# Patient Record
Sex: Male | Born: 1935 | Race: White | Hispanic: No | Marital: Married | Smoking: Former smoker
Health system: Southern US, Community
[De-identification: ages and names within clinical notes are randomized; demographics above are authoritative.]

## PROBLEM LIST (undated history)

## (undated) DIAGNOSIS — E785 Hyperlipidemia, unspecified: Secondary | ICD-10-CM

## (undated) DIAGNOSIS — I2510 Atherosclerotic heart disease of native coronary artery without angina pectoris: Secondary | ICD-10-CM

## (undated) DIAGNOSIS — D649 Anemia, unspecified: Secondary | ICD-10-CM

## (undated) DIAGNOSIS — Z8701 Personal history of pneumonia (recurrent): Secondary | ICD-10-CM

## (undated) DIAGNOSIS — G459 Transient cerebral ischemic attack, unspecified: Secondary | ICD-10-CM

## (undated) DIAGNOSIS — N400 Enlarged prostate without lower urinary tract symptoms: Secondary | ICD-10-CM

## (undated) DIAGNOSIS — E119 Type 2 diabetes mellitus without complications: Secondary | ICD-10-CM

## (undated) DIAGNOSIS — I10 Essential (primary) hypertension: Secondary | ICD-10-CM

## (undated) HISTORY — DX: Personal history of pneumonia (recurrent): Z87.01

## (undated) HISTORY — DX: Atherosclerotic heart disease of native coronary artery without angina pectoris: I25.10

## (undated) HISTORY — DX: Type 2 diabetes mellitus without complications: E11.9

## (undated) HISTORY — PX: HERNIA REPAIR: SHX51

## (undated) HISTORY — DX: Transient cerebral ischemic attack, unspecified: G45.9

## (undated) HISTORY — DX: Benign prostatic hyperplasia without lower urinary tract symptoms: N40.0

## (undated) HISTORY — DX: Hyperlipidemia, unspecified: E78.5

## (undated) HISTORY — PX: TONSILLECTOMY: SUR1361

## (undated) HISTORY — DX: Essential (primary) hypertension: I10

## (undated) HISTORY — DX: Anemia, unspecified: D64.9

## (undated) HISTORY — PX: CATARACT EXTRACTION: SUR2

---

## 1990-05-25 DIAGNOSIS — I2510 Atherosclerotic heart disease of native coronary artery without angina pectoris: Secondary | ICD-10-CM

## 1990-05-25 HISTORY — PX: CORONARY ARTERY BYPASS GRAFT: SHX141

## 1990-05-25 HISTORY — DX: Atherosclerotic heart disease of native coronary artery without angina pectoris: I25.10

## 2007-05-25 ENCOUNTER — Ambulatory Visit (HOSPITAL_COMMUNITY): Admission: RE | Admit: 2007-05-25 | Discharge: 2007-05-25 | Payer: Self-pay | Admitting: Internal Medicine

## 2007-06-02 ENCOUNTER — Ambulatory Visit (HOSPITAL_COMMUNITY): Admission: RE | Admit: 2007-06-02 | Discharge: 2007-06-02 | Payer: Self-pay | Admitting: Internal Medicine

## 2007-06-22 ENCOUNTER — Ambulatory Visit: Payer: Self-pay | Admitting: Cardiology

## 2007-09-15 ENCOUNTER — Ambulatory Visit (HOSPITAL_COMMUNITY): Admission: RE | Admit: 2007-09-15 | Discharge: 2007-09-15 | Payer: Self-pay | Admitting: Internal Medicine

## 2008-06-27 ENCOUNTER — Ambulatory Visit: Payer: Self-pay | Admitting: Cardiology

## 2009-01-09 ENCOUNTER — Encounter (INDEPENDENT_AMBULATORY_CARE_PROVIDER_SITE_OTHER): Payer: Self-pay | Admitting: *Deleted

## 2009-01-09 LAB — CONVERTED CEMR LAB
ALT: 11 units/L
AST: 12 units/L
CO2: 23 meq/L
Chloride: 105 meq/L
Cholesterol: 136 mg/dL
Creatinine, Ser: 1.39 mg/dL
Glucose, Bld: 115 mg/dL
HDL: 46 mg/dL
Hgb A1c MFr Bld: 5.5 %
Total Protein: 6.8 g/dL

## 2009-07-22 ENCOUNTER — Encounter (INDEPENDENT_AMBULATORY_CARE_PROVIDER_SITE_OTHER): Payer: Self-pay | Admitting: *Deleted

## 2009-07-23 LAB — CONVERTED CEMR LAB
ALT: 12 units/L
Alkaline Phosphatase: 34 units/L
Calcium: 9.4 mg/dL
Creatinine, Ser: 1.35 mg/dL
Glomerular Filtration Rate, Af Am: 51 mL/min/{1.73_m2}
Glucose, Bld: 85 mg/dL
LDL Cholesterol: 64 mg/dL
Sodium: 138 meq/L

## 2009-07-26 ENCOUNTER — Encounter (INDEPENDENT_AMBULATORY_CARE_PROVIDER_SITE_OTHER): Payer: Self-pay | Admitting: *Deleted

## 2009-07-26 DIAGNOSIS — J189 Pneumonia, unspecified organism: Secondary | ICD-10-CM

## 2009-07-30 ENCOUNTER — Ambulatory Visit: Payer: Self-pay | Admitting: Cardiology

## 2009-12-16 ENCOUNTER — Encounter (INDEPENDENT_AMBULATORY_CARE_PROVIDER_SITE_OTHER): Payer: Self-pay | Admitting: *Deleted

## 2009-12-16 LAB — CONVERTED CEMR LAB
ALT: 10 units/L
Alkaline Phosphatase: 36 units/L
CO2: 26 meq/L
Calcium: 9.5 mg/dL
Creatinine, Ser: 1.29 mg/dL
Glomerular Filtration Rate, Af Am: >60 mL/min/{1.73_m2}
Glucose, Bld: 111 mg/dL
HDL: 44 mg/dL
Hgb A1c MFr Bld: 6.2 %
LDL Cholesterol: 72 mg/dL
Sodium: 140 meq/L
Total Protein: 7.1 g/dL

## 2010-01-21 ENCOUNTER — Encounter: Payer: Self-pay | Admitting: Adult Health

## 2010-01-21 ENCOUNTER — Ambulatory Visit: Payer: Self-pay | Admitting: Cardiology

## 2010-06-24 NOTE — Miscellaneous (Signed)
Summary: LABS CMP,LIPIDS,01/09/2009  Clinical Lists Changes  Observations: Added new observation of CALCIUM: 9.2 mg/dL (03/47/4259 56:38) Added new observation of ALBUMIN: 4.5 g/dL (75/64/3329 51:88) Added new observation of PROTEIN, TOT: 6.8 g/dL (41/66/0630 16:01) Added new observation of SGPT (ALT): 11 units/L (01/09/2009 14:51) Added new observation of SGOT (AST): 12 units/L (01/09/2009 14:51) Added new observation of ALK PHOS: 40 units/L (01/09/2009 14:51) Added new observation of CREATININE: 1.39 mg/dL (09/32/3557 32:20) Added new observation of BUN: 24 mg/dL (25/42/7062 37:62) Added new observation of BG RANDOM: 115 mg/dL (83/15/1761 60:73) Added new observation of CO2 PLSM/SER: 23 meq/L (01/09/2009 14:51) Added new observation of CL SERUM: 105 meq/L (01/09/2009 14:51) Added new observation of K SERUM: 4.9 meq/L (01/09/2009 14:51) Added new observation of NA: 141 meq/L (01/09/2009 14:51) Added new observation of LDL: 66 mg/dL (71/10/2692 85:46) Added new observation of HDL: 46 mg/dL (27/07/5007 38:18) Added new observation of TRIGLYC TOT: 118 mg/dL (29/93/7169 67:89) Added new observation of CHOLESTEROL: 136 mg/dL (38/02/1750 02:58) Added new observation of HGBA1C: 5.5 % (01/09/2009 14:51)

## 2010-06-24 NOTE — Assessment & Plan Note (Signed)
Summary: 1 YR /FU PER CHECKOUT ON 06/27/08/TG  Medications Added ATIVAN 1 MG TABS (LORAZEPAM) take 2 tabs at bedtime METFORMIN HCL 500 MG TABS (METFORMIN HCL) take 1 tab two times a day SIMVASTATIN 40 MG TABS (SIMVASTATIN) take 1 tab daily      Allergies Added: NKDA  Visit Type:  Follow-up Primary Provider:  Sabino Snipes   History of Present Illness: [name] [name] returns as scheduled for continued assessment and treatment of coronary disease and cardiovascular risk factors, now 19 years following CABG surgery.  Over the past 12 months, he has continued to do extremely well.  He notes no dyspnea, chest discomfort nor other cardiopulmonary symptoms.  Blood pressure control has been good.  Recent laboratory studies were obtained by Dr. Margo Aye, but the patient is unaware of the results.  We have requested those.  MRI Brain  Procedure date:  02/17/2006  Findings:      Sinus disease Fairly mild supratentorial small vessel disease No acute infarction  CT Brain  Procedure date:  02/17/2006  Findings:      Sinus disease Prior small lacunar infarct in the left globus palladium Mild generalized atrophy  Carotid Doppler  Procedure date:  02/17/2006  Findings:      No significant focal increase in velocity   Current Medications (verified): 1)  Plavix 75 Mg Tabs (Clopidogrel Bisulfate) .... Take 1 Tablet By Mouth Once Daily 2)  Quinapril Hcl 40 Mg Tabs (Quinapril Hcl) .... Take 1 Tablet By Mouth Once A Day 3)  Ativan 1 Mg Tabs (Lorazepam) .... Take 2 Tabs At Bedtime 4)  Avodart 0.5 Mg Caps (Dutasteride) .... Take 1 Tab Daily 5)  Flomax 0.4 Mg Caps (Tamsulosin Hcl) .... Take 1 Tab Daily 6)  Aspir-Low 81 Mg Tbec (Aspirin) .... Take 1 Tab Daily 7)  Metformin Hcl 500 Mg Tabs (Metformin Hcl) .... Take 1 Tab Two Times A Day 8)  Chlorthalidone 25 Mg Tabs (Chlorthalidone) .... Take 1/2 Tab Daily 9)  Ra Fish Oil 1000 Mg Caps (Omega-3 Fatty Acids) .... Take 1 Cap Daily 10)  Symbicort  160-4.5 Mcg/act Aero (Budesonide-Formoterol Fumarate) .... Use As Needed 11)  Simvastatin 40 Mg Tabs (Simvastatin) .... Take 1 Tab Daily  Allergies (verified): No Known Drug Allergies  Past History:  PMH, FH, and Social History reviewed and updated.  Past Medical History: ASCVD-CABG surgery-1992 HYPERTENSION (ICD-401.9) HYPERLIPIDEMIA (ICD-272.4) TRANSIENT ISCHEMIC ATTACK (ICD-435.9) Tobacco abuse-40 pack years discontinued in 1996 PNEUMONIA (ICD-486) DIABETES MELLITUS, TYPE II (ICD-250.00) BENIGN PROSTATIC HYPERTROPHY, HX OF (ICD-V13.8)  Past Surgical History: Coronary artery bypass graft surgery-1992 Tonsillectomy Herniorrhaphy Bilaterall cataract  extraction  Review of Systems  The patient denies anorexia, weight loss, weight gain, vision loss, decreased hearing, hoarseness, chest pain, syncope, dyspnea on exertion, peripheral edema, prolonged cough, headaches, hemoptysis, abdominal pain, and melena.    Vital Signs:  Patient profile:   77 year old male Height:      69 inches Weight:      142 pounds BMI:     21.05 Pulse rate:   83 / minute BP sitting:   124 / 68  (right arm)  Vitals Entered By: Larita Fife Via LPN (July 30, 452 2:55 PM)  Physical Exam  General:  Thin; well developed; no acute distress:   Neck-No JVD; no carotid bruits: Lungs-No tachypnea, no rales; no rhonchi; no wheezes: Cardiovascular-normal PMI; normal S1 and S2; minimal systolic murmur Abdomen-BS normal; soft and non-tender without masses or organomegaly:  Musculoskeletal-No deformities, no cyanosis or clubbing:  Neurologic-Normal cranial nerves; symmetric strength and tone; oriented x3; memory slightly depressed at 5-6 and digits; no dysarthria; no dysnomia Skin-Warm, no significant lesions: Extremities-Nl distal pulses; no edema:     Impression & Recommendations:  Problem # 1:  ATHEROSCLEROTIC CARDIOVASCULAR DISEASE (ICD-429.2) No current symptoms to suggest recurrent myocardial ischemia.   Management will continue to focus on optimal control of risk factors.    We still have not received records requested from his prior cardiologist.  That request will be renewed.  I will review these records when available.  Problem # 2:  HYPERLIPIDEMIA (ICD-272.4) Lipid profile was excellent in August of 2010, and has apparently been obtained within the past week or two.  A request for review of those results is pending.  Problem # 3:  HYPERTENSION (ICD-401.9) Blood pressure control is excellent; current medications will be continued.  Problem # 4:  TRANSIENT ISCHEMIC ATTACK (ICD-435.9) [facility] from [facility] reviewed.  No acute central nervous system damage was identified.  A CT scan suggested the presence of an old lacunar infarction, but this was not verified by magnetic resonance imaging.  Initial symptoms of confusion, speech disturbance and unilateral weakness resolved completely.  Plavix will be continued.  [name] [name] will return to the office for reassessment in one year.  Patient Instructions: 1)  Your physician recommends that you schedule a follow-up appointment in: 1 year

## 2010-06-24 NOTE — Miscellaneous (Signed)
Summary: quinapril update  Clinical Lists Changes  Medications: Added new medication of QUINAPRIL HCL 40 MG TABS (QUINAPRIL HCL) Take 1 tablet by mouth once a day

## 2010-06-24 NOTE — Assessment & Plan Note (Signed)
Summary: per pt's daughter/having problems/tg  Medications Added NITROLINGUAL 0.4 MG/SPRAY SOLN (NITROGLYCERIN) One spray under tongue every 5 minutes as needed for chest pain---may repeat times three      Allergies Added: NKDA  Visit Type:  Follow-up Primary Provider:  Sabino Snipes  CC:  sob and numbness in arms and legs.  History of Present Illness: [name] [name] is a very pleasant 77 CM with known history of CAD with CABG 10 years ago.  I do not have records available for anatomy of bypass grafts.  He also has a history of HTN, DM, Hypercholesterolemia.   He has had no recent CV testing as he has been asymtomatic and works a lot in his yard without compaint of weakness, SOB or fatigue.  He is focused on his wife who is debilitated from arthritis and other health issues.  He is not getting very much sleep and feels stressed.  His son -n-law is with him who says the patient's daughter helps him as much as she can.    Preventive Screening-Counseling & Management  Alcohol-Tobacco     Alcohol drinks/day: 0     Smoking Status: quit  Current Medications (verified): 1)  Plavix 75 Mg Tabs (Clopidogrel Bisulfate) .... Take 1 Tablet By Mouth Once Daily 2)  Quinapril Hcl 40 Mg Tabs (Quinapril Hcl) .... Take 1 Tablet By Mouth Once A Day 3)  Ativan 1 Mg Tabs (Lorazepam) .... Take 2 Tabs At Bedtime 4)  Avodart 0.5 Mg Caps (Dutasteride) .... Take 1 Tab Daily 5)  Flomax 0.4 Mg Caps (Tamsulosin Hcl) .... Take 1 Tab Daily 6)  Aspir-Low 81 Mg Tbec (Aspirin) .... Take 1 Tab Daily 7)  Metformin Hcl 500 Mg Tabs (Metformin Hcl) .... Take 1 Tab Two Times A Day 8)  Chlorthalidone 25 Mg Tabs (Chlorthalidone) .... Take 1/2 Tab Daily 9)  Ra Fish Oil 1000 Mg Caps (Omega-3 Fatty Acids) .... Take 1 Cap Daily 10)  Symbicort 160-4.5 Mcg/act Aero (Budesonide-Formoterol Fumarate) .... Use As Needed 11)  Simvastatin 40 Mg Tabs (Simvastatin) .... Take 1 Tab Daily 12)  Nitrolingual 0.4 Mg/spray Soln (Nitroglycerin) ....  One Spray Under Tongue Every 5 Minutes As Needed For Chest Pain---May Repeat Times Three  Allergies (verified): No Known Drug Allergies  Past History:  Past medical, surgical, family and social histories (including risk factors) reviewed, and no changes noted (except as noted below).  Past Medical History: Reviewed history from 07/30/2009 and no changes required. ASCVD-CABG surgery-1992 HYPERTENSION (ICD-401.9) HYPERLIPIDEMIA (ICD-272.4) TRANSIENT ISCHEMIC ATTACK (ICD-435.9) Tobacco abuse-40 pack years discontinued in 1996 PNEUMONIA (ICD-486) DIABETES MELLITUS, TYPE II (ICD-250.00) BENIGN PROSTATIC HYPERTROPHY, HX OF (ICD-V13.8)  Past Surgical History: Reviewed history from 07/30/2009 and no changes required. Coronary artery bypass graft surgery-1992 Tonsillectomy Herniorrhaphy Bilaterall cataract  extraction  Family History: Reviewed history from 07/26/2009 and no changes required. Father:deceased age 28 cancer type unknown Mother:deceased 56 cancer type unknown Siblings:7 brothers all deceased causes unknow 5 sisters all deceased causes unknown  Social History: Reviewed history from 07/26/2009 and no changes required. Retired  Married  Tobacco Use - Former.  Alcohol Use - no Regular Exercise - yes Drug Use - no Alcohol drinks/day:  0  Review of Systems       Stress and insomina secondary to wife's illness.  All other systems have been reviewed and are negative unless stated above.   Vital Signs:  Patient profile:   77 year old male Weight:      136 pounds BMI:  20.16 O2 Sat:      94 % on Room air Pulse rate:   93 / minute BP sitting:   144 / 72  (right arm)  Vitals Entered By: Dreama Saa, CNA (January 21, 2010 11:21 AM)  O2 Flow:  Room air  Physical Exam  General:  normal appearance.   Head:  normocephalic and atraumatic Mouth:  Teeth, gums and palate normal. Oral mucosa normal. Lungs:  Clear bilaterally to auscultation and percussion. Heart:   Distant with RRR.No MRG. Abdomen:  Bowel sounds positive; abdomen soft and non-tender without masses, organomegaly, or hernias noted. No hepatosplenomegaly. Msk:  Kyphosis noted. Pulses:  pulses normal in all 4 extremities Extremities:  No clubbing or cyanosis. Neurologic:  Hard of hearing. Psych:  Normal affect.   EKG  Procedure date:  01/21/2010  Findings:      Normal sinus rhythm with rate of:  86bpm  Impression & Recommendations:  Problem # 1:  ATHEROSCLEROTIC CARDIOVASCULAR DISEASE (ICD-429.2) [name] [name] is asymptomatic from cardiovascular standpoint.  He remains active.  He is under a lot of stress concerning his wife's care and is not sleeping well.  I have advised him that increased stress and lack of sleep are not helpful, most importantly in the setting of heart disease.  I have provided him with a Rx for NTG spray. I have also advised him to talk with Dr. Margo Aye if help in the home is needed to allow him to have less stress and the ability to sleep. From a safety standpoint I have also advised that lack of sleep can affect attention span and he is to be especially careful when driving, as his reactions could be slower than normal.  He verbalized understanding.    Problem # 2:  HYPERTENSION (ICD-401.9) Mildly increased on this visit.  I have no trend of higher BP, however.  His last BP was 124/68, which is optimal for a person with DM and heart disease.  If this remain elevated on follow up visits with primary care, would advise that he have additional antihypertensive added-norvasc or low dose BB as a third choice with lung disease. He can also have his chlorothialidone increased from 12.5 mg to 25mg  daily as a first change in BP medications. His updated medication list for this problem includes:    Quinapril Hcl 40 Mg Tabs (Quinapril hcl) .Marland Kitchen... Take 1 tablet by mouth once a day    Aspir-low 81 Mg Tbec (Aspirin) .Marland Kitchen... Take 1 tab daily    Chlorthalidone 25 Mg Tabs (Chlorthalidone)  .Marland Kitchen... Take 1/2 tab daily  Patient Instructions: 1)  Your physician recommends that you schedule a follow-up appointment in: 6 months 2)  Your physician has recommended you make the following change in your medication: Start using Nitroglycerin spray as needed for chest pain 3)  Your physician recommended you take 1 tablet (or 1 spray) under tongue at onset of chest pain; you may repeat every 5 minutes for up to 3 doses. If 3 or more doses are required, call 911 and proceed to the ER immediately. Prescriptions: CHLORTHALIDONE 25 MG TABS (CHLORTHALIDONE) take 1/2 tab daily  #15 x 10   Entered by:   Larita Fife Via LPN   Authorized by:   Joni Reining, NP   Signed by:   Larita Fife Via LPN on 51/70/0174   Method used:   Electronically to        [facility] (retail)       [address]  [place], [place]  [address]       Ph: [phone]       Fax: [phone]   RxID:   [id number] NITROLINGUAL 0.4 MG/SPRAY SOLN (NITROGLYCERIN) One spray under tongue every 5 minutes as needed for chest pain---may repeat times three  #1 x 3   Entered by:   Larita Fife Via LPN   Authorized by:   Joni Reining, NP   Signed by:   Larita Fife Via LPN on 09/32/3557   Method used:   Electronically to        [facility] (retail)       [address]       [place], [place]  [address]       Ph: [phone]       Fax: [phone]   RxID:   [id number]

## 2010-07-15 ENCOUNTER — Encounter (INDEPENDENT_AMBULATORY_CARE_PROVIDER_SITE_OTHER): Payer: Self-pay | Admitting: *Deleted

## 2010-07-17 ENCOUNTER — Ambulatory Visit (INDEPENDENT_AMBULATORY_CARE_PROVIDER_SITE_OTHER): Payer: Medicare Other | Admitting: Cardiology

## 2010-07-17 ENCOUNTER — Encounter: Payer: Self-pay | Admitting: Cardiology

## 2010-07-17 DIAGNOSIS — I2510 Atherosclerotic heart disease of native coronary artery without angina pectoris: Secondary | ICD-10-CM

## 2010-07-18 ENCOUNTER — Other Ambulatory Visit: Payer: Self-pay | Admitting: Cardiology

## 2010-07-18 ENCOUNTER — Ambulatory Visit (HOSPITAL_COMMUNITY)
Admission: RE | Admit: 2010-07-18 | Discharge: 2010-07-18 | Disposition: A | Discharge status: Home / Self Care | Payer: Medicare Other | Source: Ambulatory Visit | Attending: Cardiology | Admitting: Cardiology

## 2010-07-18 DIAGNOSIS — R0989 Other specified symptoms and signs involving the circulatory and respiratory systems: Secondary | ICD-10-CM

## 2010-07-18 DIAGNOSIS — I517 Cardiomegaly: Secondary | ICD-10-CM | POA: Insufficient documentation

## 2010-07-22 NOTE — Miscellaneous (Signed)
Summary: LABS CMP,LIPIDS 12/16/2009  Clinical Lists Changes  Observations: Added new observation of CALCIUM: 9.5 mg/dL (16/02/9603 54:09) Added new observation of ALBUMIN: 4.6 g/dL (81/19/1478 29:56) Added new observation of PROTEIN, TOT: 7.1 g/dL (21/30/8657 84:69) Added new observation of SGPT (ALT): 10 units/L (12/16/2009 15:00) Added new observation of SGOT (AST): 15 units/L (12/16/2009 15:00) Added new observation of ALK PHOS: 36 units/L (12/16/2009 15:00) Added new observation of GFR AA: >60 mL/min/1.66m2 (12/16/2009 15:00) Added new observation of GFR: 54 mL/min (12/16/2009 15:00) Added new observation of CREATININE: 1.29 mg/dL (62/95/2841 32:44) Added new observation of BUN: 27 mg/dL (05/27/7251 66:44) Added new observation of BG RANDOM: 111 mg/dL (03/47/4259 56:38) Added new observation of CO2 PLSM/SER: 26 meq/L (12/16/2009 15:00) Added new observation of CL SERUM: 104 meq/L (12/16/2009 15:00) Added new observation of K SERUM: 4.7 meq/L (12/16/2009 15:00) Added new observation of NA: 140 meq/L (12/16/2009 15:00) Added new observation of LDL: 72 mg/dL (75/64/3329 51:88) Added new observation of HDL: 44 mg/dL (41/66/0630 16:01) Added new observation of TRIGLYC TOT: 133 mg/dL (09/32/3557 32:20) Added new observation of CHOLESTEROL: 143 mg/dL (25/42/7062 37:62) Added new observation of HGBA1C: 6.2 % (12/16/2009 15:00)

## 2010-07-31 NOTE — Assessment & Plan Note (Signed)
Summary: due for 6 mth f/u per pt phone call/tg  Medications Added ATIVAN 0.5 MG TABS (LORAZEPAM) 2 tablets at bedtime for sleep PROAIR HFA 108 (90 BASE) MCG/ACT AERS (ALBUTEROL SULFATE) 2 puffs prn      Allergies Added: NKDA  Visit Type:  6 month follow up Primary Provider:  Sabino Snipes   History of Present Illness: [name] returns to the office as scheduled for continued assessment and treatment of coronary disease and cardiovascular risk factors.  Since he was last seen 6 months ago, he has done generally well.  He denies orthopnea, PND, chest discomfort, dyspnea, lightheadedness or syncope.   Current Medications (verified): 1)  Plavix 75 Mg Tabs (Clopidogrel Bisulfate) .... Take 1 Tablet By Mouth Once Daily 2)  Quinapril Hcl 40 Mg Tabs (Quinapril Hcl) .... Take 1 Tablet By Mouth Once A Day 3)  Ativan 0.5 Mg Tabs (Lorazepam) .... 2 Tablets At Bedtime For Sleep 4)  Avodart 0.5 Mg Caps (Dutasteride) .... Take 1 Tab Daily 5)  Flomax 0.4 Mg Caps (Tamsulosin Hcl) .... Take 1 Tab Daily 6)  Aspir-Low 81 Mg Tbec (Aspirin) .... Take 1 Tab Daily 7)  Metformin Hcl 500 Mg Tabs (Metformin Hcl) .... Take 1 Tab Two Times A Day 8)  Chlorthalidone 25 Mg Tabs (Chlorthalidone) .... Take 1/2 Tab Daily 9)  Ra Fish Oil 1000 Mg Caps (Omega-3 Fatty Acids) .... Take 1 Cap Daily 10)  Proair Hfa 108 (90 Base) Mcg/act Aers (Albuterol Sulfate) .... 2 Puffs Prn 11)  Simvastatin 40 Mg Tabs (Simvastatin) .... Take 1 Tab Daily 12)  Nitrolingual 0.4 Mg/spray Soln (Nitroglycerin) .... One Spray Under Tongue Every 5 Minutes As Needed For Chest Pain---May Repeat Times Three  Allergies (verified): No Known Drug Allergies  Past History:  Past Medical History: ASCVD-CABG surgery-1992 Hypertension Hyperlipidemia TIA Tobacco abuse-40 pack years discontinued in 1996 Pneumonia Diabetes Benign prostatic hypertrophy  Review of Systems       See history of present illness.  Vital Signs:  Patient  profile:   77 year old male Height:      69 inches Weight:      139 pounds O2 Sat:      96 % on Room air Pulse rate:   81 / minute BP sitting:   128 / 74  (left arm)  Vitals Entered By: Teressa Lower RN (July 17, 2010 1:00 PM)  O2 Flow:  Room air  Physical Exam  General:  Proportionate weight and height; well developed; no acute distress:   Neck-No JVD; no carotid bruits: Lungs-Mild kyphosis; modest rales at the right base; no rhonchi; no wheezes; prolonged expiratory phase Cardiovascular-normal PMI; normal S1 and S2; S4 present Abdomen-BS normal; soft and non-tender without masses or organomegaly:  Musculoskeletal-No deformities, no cyanosis or clubbing: Neurologic-Normal cranial nerves; symmetric strength and tone:  Skin-Warm, no significant lesions: Extremities-Nl distal pulses; no edema:     Impression & Recommendations:  Problem # 1:  ATHEROSCLEROTIC CARDIOVASCULAR DISEASE (ICD-429.2) No evidence for ischemic symptoms; current approach shows optimally managing risk factors will be continued.  Problem # 2:  HYPERLIPIDEMIA (ICD-272.4) Lipid profile was excellent 7 months ago on current therapy, which will be continued.  CHOL: 143 (12/16/2009)   LDL: 72 (12/16/2009)   HDL: 44 (12/16/2009)   TG: 133 (12/16/2009)  Problem # 3:  HYPERTENSION (ICD-401.9) Blood pressure control is generally good; patient will monitor home values.  BP today: 128/74 Prior BP: 144/72 (01/21/2010)  Labs Reviewed: K+: 4.7 (12/16/2009) Creat: : 1.29 (  12/16/2009)  Other Orders: T-Chest x-ray, 2 views (96295)  Patient Instructions: 1)  Your physician recommends that you schedule a follow-up appointment in: 1 year 2)  A chest x-ray takes a picture of the organs and structures inside the chest, including the heart, lungs, and blood vessels. This test can show several things, including, whether the heart is enlarged; whether fluid is building up in the lungs; and whether pacemaker /  defibrillator leads are still in place.

## 2010-09-13 ENCOUNTER — Other Ambulatory Visit: Payer: Self-pay | Admitting: Cardiology

## 2010-10-07 NOTE — Letter (Signed)
June 22, 2007    Catalina Pizza, M.D.  [address] [place],  [place] [address]   RE:  [name] [name], [name] [name]  MRN:  [id number]  /  DOB:  [birth year]   Dear Ian Malkin:   It was my pleasure evaluating [name] [name] in the office today in  consultation at your request.  As you know, this nice gentleman has  coronary disease and underwent CABG surgery at [facility]  approximately 13 years ago.  He has been followed by a cardiologist  named Chalmers Guest  in [place].  We have contacted both the hospital and  the doctor's office for records concerning his cardiac disease.  He has  apparently been stable since surgery and has not required subsequent  testing.  Management of vascular risk factors sounds as if it has been  good.  He is relatively active, including doing yard work, and denies  all cardiopulmonary symptoms.   He does have some hyperlipidemia and hypertension.  He previously was a  40 pack-year cigarette smoker but discontinued this at the time of his  cardiac surgery 13 years ago.  He has had elevated fasting blood glucose  values but has not required pharmacologic therapy.   PAST MEDICAL HISTORY:  Otherwise notable for a hospitalization for  pneumonia and tonsillectomy as a child.  He apparently has BPH based  upon his current medical regimen.   He describes no allergies.   CURRENT MEDICATIONS:  1. Ativan 0.5 mg b.i.d.  2. Avodart 0.5 mg daily.  3. Quinapril 40 mg daily.  4. Flomax 0.4 mg daily.  5. Clopidogrel 75 mg daily.  6. Atorvastatin 20 mg daily.  7. Aspirin 81 mg daily.   [facility] has supplied recent medical records.  He was seen in  the emergency department in 2007 with symptoms consistent with a TIA.  A  subsequent CT scan of the head, MRI and carotid ultrasound studies  revealed no significant focal disease.  He has been treated with  clopidogrel with no recurrence.   SOCIAL HISTORY:  Retired.  Recently moved to this area to have the  availability of  assistance from a daughter in caring for his wife, who  has Alzheimer's.  He has three adult children.  He denies excessive use  of alcohol.   FAMILY HISTORY:  Both mother and father died of neoplastic disease.  There is no prominent family history for coronary disease.   REVIEW OF SYSTEMS:  Notable for the need for corrective lenses, prior  cataract surgery bilaterally, hearing impairment with hearing aids  bilaterally, upper dentures, urinary frequency, diffuse arthritic pain,  and previously having been told that he has emphysema.  were reviewed  and are negative.   EXAM:  A pleasant, trim gentleman in no acute distress, with somewhat  halting speech at times and difficulty reproducing his medical history.  The weight is 146, blood pressure 140/80, heart rate 80 and regular,  respirations 16.  HEENT:  Nonvisualization of the fundus on the left; normal vasculature  of the right; normal lids and conjunctivae; normal oral mucosa.  NECK:  No jugular venous distention; normal carotid upstrokes without  bruits.  THORAX:  Mild to moderate kyphosis; clear lung fields.  CARDIAC:  Normal first and second heart sounds; fourth heart sound  present.  ABDOMEN:  Soft and nontender; no organomegaly.  EXTREMITIES:  No edema; distal pulses intact.  NEUROLOGIC:  Normal cranial nerves; symmetric strength and tone.   EKG:  Normal sinus  rhythm; delayed R-wave progression; otherwise  unremarkable.  No prior tracing for comparison.   Recent laboratories include normal chemistry profile.  A fasting glucose  of 124.  Total cholesterol of 165 with triglycerides of 179, HDL 48 and  LDL of 80.   IMPRESSION:  [name] [name] has done very well following CABG surgery.  He has  been diligent about controlling cardiovascular risk factors.  Current  medication appears optimal except for suboptimal control of hypertension  in the setting of diabetes.  We will add chlorthalidone 12.5  mg daily to his medical regimen  and check a metabolic profile in a few  weeks.  I also suggested that he take fish oil 1 capsule b.i.d.  I will  plan to see this nice gentleman again in 1 year.   Thank so much for sending him to me.    Sincerely,      Gerrit Friends. Dietrich Pates, MD, [facility]  Electronically Signed    RMR/MedQ  DD: 06/22/2007  DT: 06/23/2007  Job #: [id number]

## 2010-10-07 NOTE — Letter (Signed)
June 27, 2008    Catalina Pizza, MD  [address] [place],  [place] [address]   RE:  [name], [name]  MRN:  [id number]  /  DOB:  [birth year]   Dear [name] [name],   [name] [name] returns to the office for continued assessment and treatment of  coronary artery disease, now 14 years following CABG surgery.  He has  had hyperlipidemia and hypertension that have been under excellent  control.  He discontinued cigarette smoking at the time of his surgery.  Over the past year, he has done fine with no cardiopulmonary symptoms.  He does yard work and work around [employer], but is not as active as he  would like due to care that he most provide for his wife.  It appears  that he has developed diabetes, as metformin 1000 mg has been added to  his medical regime.  Otherwise, his drugs are unchanged from his last  visit a year ago with the exception of the chlorthalidone and fish oil  that I added.   PHYSICAL EXAMINATION:  GENERAL:  Thin, pleasant gentleman of few words  in no acute distress.  VITAL SIGNS:  The weight is 147, stable, blood pressure 120/60, heart  rate 94 and regular, respirations 14.  NECK:  No jugular venous distention; normal carotid upstrokes without  bruits.  LUNGS:  Clear.  CARDIAC:  Split first heart sounds; normal second heart sound.  ABDOMEN:  Soft and nontender; normal bowel sounds; no organomegaly.  EXTREMITIES:  Normal distal pulses; no edema.   EKG:  Normal sinus rhythm; delayed R-wave progression; otherwise normal.  No change compared with June 22, 2007.   IMPRESSION:  [name] [name] is doing superbly.  I will leave monitoring of  lipids and electrolytes to your discretion and plan to see this nice  gentleman again in 1 year.  We discussed his getting some help from  family members, so he can spend some time away from home on his own.    Sincerely,      [name] [name]. Dietrich Pates, MD, [facility]  Electronically Signed    RMR/MedQ  DD: 06/27/2008  DT: 06/28/2008  Job #: [id number]

## 2010-11-13 ENCOUNTER — Other Ambulatory Visit: Payer: Self-pay | Admitting: Cardiology

## 2011-02-10 ENCOUNTER — Other Ambulatory Visit: Payer: Self-pay | Admitting: *Deleted

## 2011-02-10 MED ORDER — CHLORTHALIDONE 25 MG PO TABS: 25.0000 mg | ORAL_TABLET | Freq: Every day | ORAL | Status: DC

## 2011-04-22 ENCOUNTER — Encounter (HOSPITAL_COMMUNITY): Payer: Self-pay | Admitting: *Deleted

## 2011-04-22 ENCOUNTER — Emergency Department (HOSPITAL_COMMUNITY)
Admission: EM | Admit: 2011-04-22 | Discharge: 2011-04-22 | Disposition: A | Discharge status: Home / Self Care | Payer: Medicare Other | Attending: Emergency Medicine | Admitting: Emergency Medicine

## 2011-04-22 DIAGNOSIS — I10 Essential (primary) hypertension: Secondary | ICD-10-CM | POA: Insufficient documentation

## 2011-04-22 DIAGNOSIS — I2510 Atherosclerotic heart disease of native coronary artery without angina pectoris: Secondary | ICD-10-CM | POA: Insufficient documentation

## 2011-04-22 DIAGNOSIS — E119 Type 2 diabetes mellitus without complications: Secondary | ICD-10-CM | POA: Insufficient documentation

## 2011-04-22 DIAGNOSIS — R040 Epistaxis: Secondary | ICD-10-CM | POA: Insufficient documentation

## 2011-04-22 DIAGNOSIS — I252 Old myocardial infarction: Secondary | ICD-10-CM | POA: Insufficient documentation

## 2011-04-22 LAB — DIFFERENTIAL
Basophils Absolute: 0 10*3/uL (ref 0.0–0.1)
Eosinophils Absolute: 0.3 10*3/uL (ref 0.0–0.7)
Lymphs Abs: 1.5 10*3/uL (ref 0.7–4.0)
Monocytes Absolute: 0.3 10*3/uL (ref 0.1–1.0)
Monocytes Relative: 4 % (ref 3–12)
Neutrophils Relative %: 73 % (ref 43–77)

## 2011-04-22 LAB — BASIC METABOLIC PANEL
BUN: 19 mg/dL (ref 6–23)
Chloride: 102 mEq/L (ref 96–112)
Creatinine, Ser: 1.34 mg/dL (ref 0.50–1.35)
GFR calc Af Amer: 57 mL/min — ABNORMAL LOW (ref >90)
GFR calc non Af Amer: 49 mL/min — ABNORMAL LOW (ref >90)
Glucose, Bld: 141 mg/dL — ABNORMAL HIGH (ref 70–99)

## 2011-04-22 LAB — CBC
MCHC: 33.6 g/dL (ref 30.0–36.0)
Platelets: 175 10*3/uL (ref 150–400)
RBC: 3.38 MIL/uL — ABNORMAL LOW (ref 4.22–5.81)
WBC: 7.8 10*3/uL (ref 4.0–10.5)

## 2011-04-22 LAB — PROTIME-INR
INR: 1.03 (ref 0.00–1.49)
Prothrombin Time: 13.7 seconds (ref 11.6–15.2)

## 2011-04-22 MED ORDER — OXYMETAZOLINE HCL 0.05 % NA SOLN
NASAL | Status: AC
  Administered 2011-04-22: 1 via NASAL
  Filled 2011-04-22: qty 15

## 2011-04-22 MED ORDER — OXYMETAZOLINE HCL 0.05 % NA SOLN
1.0000 | Freq: Once | NASAL | Status: AC
  Administered 2011-04-22: 1 via NASAL

## 2011-04-22 MED ORDER — ACETAMINOPHEN 325 MG PO TABS
650.0000 mg | ORAL_TABLET | Freq: Once | ORAL | Status: AC
  Administered 2011-04-22: 650 mg via ORAL
  Filled 2011-04-22: qty 2

## 2011-04-22 NOTE — ED Provider Notes (Signed)
History     CSN: [id number] Arrival date & time: 04/22/2011  4:08 AM   First MD Initiated Contact with Patient 04/22/11 [phone]      Chief Complaint  Patient presents with  . Epistaxis    (Consider location/radiation/quality/duration/timing/severity/associated sxs/prior treatment) Patient is a 77 y.o. male presenting with nosebleeds.  Epistaxis    the patient is a 77 year old male, with a history of hypertension, diabetes, and coronary artery disease, who presents to the emergency department complaining of bleeding from his right nostril, which he was unable to control.  He denies lightheadedness, or shortness of breath.  He takes Plavix.  He is not on Coumadin.  He denies pain anywhere.  He and his wife both state that.  He has had congestion over the past few days.  Past Medical History  Diagnosis Date  . Diabetes mellitus   . Coronary artery disease   . Hypertension   . Myocardial infarction     History reviewed. No pertinent past surgical history.  History reviewed. No pertinent family history.  History  Substance Use Topics  . Smoking status: Never Smoker   . Smokeless tobacco: Not on file  . Alcohol Use:       Review of Systems  Constitutional: Negative for fever.  HENT: Positive for nosebleeds.   Respiratory: Negative for shortness of breath.   Neurological: Negative for dizziness and light-headedness.  Psychiatric/Behavioral: Negative for confusion.    Allergies  Review of patient's allergies indicates no known allergies.  Home Medications   Current Outpatient Rx  Name Route Sig Dispense Refill  . CHLORTHALIDONE 25 MG PO TABS Oral Take 1 tablet (25 mg total) by mouth daily. 30 tablet 2  . PLAVIX 75 MG PO TABS  TAKE 1 TABLET BY MOUTH ONCE DAILY 30 tablet 1  . QUINAPRIL HCL 40 MG PO TABS  TAKE 1 TABLET BY MOUTH ONCE DAILY 30 tablet 8    BP 149/67  Pulse 98  Temp(Src) 97.5 F (36.4 C) (Oral)  Resp 20  Ht 5\' 3"  (1.6 m)  Wt 135 lb (61.236 kg)  BMI  23.91 kg/m2  SpO2 99%  Physical Exam  Constitutional: He is oriented to person, place, and time. He appears well-developed and well-nourished.  HENT:       Bright red blood coming from his right near.  No polyps noted.  Small amount of blood in the oropharynx.  Eyes: Pupils are equal, round, and reactive to light.  Neck: Normal range of motion. Neck supple.  Musculoskeletal: Normal range of motion.  Neurological: He is alert and oriented to person, place, and time.  Skin: Skin is warm and dry.  Psychiatric: He has a normal mood and affect.    ED Course  Procedures (including critical care time)  Uncontrolled bleeding from the right knee or in the 77 year old male, on Plavix. No orthostatic symptoms.  There is no indication for laboratory testing.  Procedure 5-1/2 cm Rhino Rocket placed in the right near to achieve hemostasis.  The patient tolerated it well.  Hemostasis achieved.  Epistaxis right nares  5:54 AM Mr. Hendon continued to bleed so I removed the 5-1/2 cm Rhino Rocket and placed a 7-1/2 cm Rhino Rocket in anticipation of possible admission.  I asked the nurse to establish an IV perform a CBC, and a bmet and is well  6:48 AM Pt still has some slight bleeding. Discussed with medicine.   They are aware and may admit if he continues to bleed despite obs  in ed for a few more hours.    7:18 AM Bleeding almost stopped.  7:25 AM Bleeding resolved.   MDM  Epistaxis        Nicholes Stairs, MD 04/22/11 [phone]

## 2011-04-22 NOTE — ED Notes (Signed)
Nosebleed onset tonight with dry  Heaving.

## 2011-04-22 NOTE — ED Provider Notes (Addendum)
History     CSN: [id number] Arrival date & time: 04/22/2011  9:56 AM   First MD Initiated Contact with Patient 04/22/11 1008      Chief Complaint  Patient presents with  . Epistaxis    (Consider location/radiation/quality/duration/timing/severity/associated sxs/prior treatment) HPI  Past Medical History  Diagnosis Date  . Diabetes mellitus   . Coronary artery disease   . Hypertension   . Myocardial infarction     History reviewed. No pertinent past surgical history.  History reviewed. No pertinent family history.  History  Substance Use Topics  . Smoking status: Never Smoker   . Smokeless tobacco: Not on file  . Alcohol Use:       Review of Systems  Allergies  Review of patient's allergies indicates no known allergies.  Home Medications   Current Outpatient Rx  Name Route Sig Dispense Refill  . CHLORTHALIDONE 25 MG PO TABS Oral Take 1 tablet (25 mg total) by mouth daily. 30 tablet 2  . PLAVIX 75 MG PO TABS  TAKE 1 TABLET BY MOUTH ONCE DAILY 30 tablet 1  . QUINAPRIL HCL 40 MG PO TABS  TAKE 1 TABLET BY MOUTH ONCE DAILY 30 tablet 8    BP 141/113  Pulse 109  Temp(Src) 97.8 F (36.6 C) (Oral)  Resp 18  Ht 5\' 6"  (1.676 m)  Wt 140 lb (63.504 kg)  BMI 22.60 kg/m2  SpO2 97%  Physical Exam  ED Course  Procedures (including critical care time)  Labs Reviewed - No data to display No results found.   1. Epistaxis     Clot removed from throat and left nostril.    MDM   [facility] ent called and will see pt at office today      Benny Lennert, MD 04/22/11 1043                         The chart was scribed for me under my direct supervision.  I personally performed the history, physical, and medical decision making and all procedures in the evaluation of this patient.Benny Lennert, MD 04/22/11 1044

## 2011-04-22 NOTE — ED Notes (Signed)
Pt was seen today in early am. Pt was seen and treated for severe nose bleed. Pt was discharged with nasal tampon to right side of nose. Pt now appears to be bleeding from left side of nose. Pt presented with trash can in hand that had a large amount of blood in it. Pt also spitting up blood.

## 2011-04-22 NOTE — ED Notes (Signed)
Pt's daughter here to transport pt to [facility]; pt discharged to via wheelchair

## 2011-04-29 ENCOUNTER — Emergency Department (HOSPITAL_COMMUNITY)
Admission: EM | Admit: 2011-04-29 | Discharge: 2011-04-29 | Disposition: A | Discharge status: Home / Self Care | Payer: Medicare Other | Attending: Emergency Medicine | Admitting: Emergency Medicine

## 2011-04-29 ENCOUNTER — Encounter (HOSPITAL_COMMUNITY): Payer: Self-pay | Admitting: *Deleted

## 2011-04-29 ENCOUNTER — Emergency Department (HOSPITAL_COMMUNITY): Payer: Medicare Other

## 2011-04-29 DIAGNOSIS — I2510 Atherosclerotic heart disease of native coronary artery without angina pectoris: Secondary | ICD-10-CM | POA: Insufficient documentation

## 2011-04-29 DIAGNOSIS — F458 Other somatoform disorders: Secondary | ICD-10-CM | POA: Insufficient documentation

## 2011-04-29 DIAGNOSIS — I252 Old myocardial infarction: Secondary | ICD-10-CM | POA: Insufficient documentation

## 2011-04-29 DIAGNOSIS — R0989 Other specified symptoms and signs involving the circulatory and respiratory systems: Secondary | ICD-10-CM

## 2011-04-29 DIAGNOSIS — J329 Chronic sinusitis, unspecified: Secondary | ICD-10-CM | POA: Insufficient documentation

## 2011-04-29 DIAGNOSIS — I10 Essential (primary) hypertension: Secondary | ICD-10-CM | POA: Insufficient documentation

## 2011-04-29 DIAGNOSIS — E119 Type 2 diabetes mellitus without complications: Secondary | ICD-10-CM | POA: Insufficient documentation

## 2011-04-29 MED ORDER — SODIUM CHLORIDE 0.9 % IV SOLN
Freq: Once | INTRAVENOUS | Status: AC
  Administered 2011-04-29: 03:00:00 via INTRAVENOUS

## 2011-04-29 MED ORDER — AZITHROMYCIN 250 MG PO TABS: 250.0000 mg | ORAL_TABLET | Freq: Every day | ORAL | Status: AC

## 2011-04-29 MED ORDER — IOHEXOL 300 MG/ML  SOLN
75.0000 mL | Freq: Once | INTRAMUSCULAR | Status: AC | PRN
  Administered 2011-04-29: 75 mL via INTRAVENOUS

## 2011-04-29 NOTE — ED Notes (Signed)
MD at bedside. 

## 2011-04-29 NOTE — ED Notes (Signed)
Patient's IV saline locked for radiology transport. Patient taken to radiology.

## 2011-04-29 NOTE — ED Notes (Signed)
Remains resting sitting up in bed. No respiratory difficulties. Denies any needs. Equal chest rise and fall, nonlabored. Denies pain. Call bell within reach. Awaiting MD eval.

## 2011-04-29 NOTE — ED Notes (Signed)
Patient back to room from radiology. Normal saline infusion restarted; infusing well with no signs of infiltration. In no distress. Equal chest rise and fall. Denies needs. Call bell within reach. Will continue to monitor.

## 2011-04-29 NOTE — ED Notes (Addendum)
Patient ambulatory to bathroom without assistance. Ambulated well with steady gait.

## 2011-04-29 NOTE — ED Notes (Signed)
Patient ambulatory to bathroom. Steady gait.

## 2011-04-29 NOTE — ED Notes (Signed)
Pt reports he was seen by Dr. Cloria Spring Monday and had nosebleed cautarized, reports since procedure pt has felt as if something is "stuck" in his throat

## 2011-04-29 NOTE — ED Notes (Signed)
Into room to attempt iv access per md order.

## 2011-04-29 NOTE — ED Notes (Signed)
Remains in radiology at this time 

## 2011-04-29 NOTE — ED Notes (Signed)
Discharge papers explained to patient and patient's daughter. Verbalized understanding. Ambulated well to waiting room for discharge.

## 2011-04-29 NOTE — ED Provider Notes (Signed)
History     CSN: [id number] Arrival date & time: 04/29/2011  1:26 AM   First MD Initiated Contact with Patient 04/29/11 0222      Chief Complaint  Patient presents with  . Airway Obstruction    (Consider location/radiation/quality/duration/timing/severity/associated sxs/prior treatment) HPI Comments: Patient recently seen by ENT for profuse nose bleed.  Had nasal packing, procedure by Dr. Lazarus Salines to stop the bleeding.  Doing well until a couple of days ago.  Started feeling as though there was something collecting in the back of his throat that was making it difficult to swallow and breathe.    Patient is a 77 y.o. male presenting with pharyngitis. The history is provided by the patient.  Sore Throat This is a new problem. The current episode started 2 days ago. The problem occurs constantly. The problem has been gradually worsening. Pertinent negatives include no chest pain. The symptoms are aggravated by swallowing. The symptoms are relieved by nothing.    Past Medical History  Diagnosis Date  . Diabetes mellitus   . Coronary artery disease   . Hypertension   . Myocardial infarction     History reviewed. No pertinent past surgical history.  No family history on file.  History  Substance Use Topics  . Smoking status: Never Smoker   . Smokeless tobacco: Not on file  . Alcohol Use: No      Review of Systems  Cardiovascular: Negative for chest pain.  All other systems reviewed and are negative.    Allergies  Review of patient's allergies indicates no known allergies.  Home Medications   Current Outpatient Rx  Name Route Sig Dispense Refill  . PROAIR HFA IN Inhalation Inhale into the lungs as needed.      . DUTASTERIDE 0.5 MG PO CAPS Oral Take 0.5 mg by mouth daily.      Marland Kitchen LORAZEPAM 1 MG PO TABS Oral Take 1 mg by mouth at bedtime.      Marland Kitchen METFORMIN HCL 500 MG PO TABS Oral Take 500 mg by mouth 2 (two) times daily with a meal.      . SIMVASTATIN 40 MG PO TABS Oral  Take 40 mg by mouth at bedtime.      . CHLORTHALIDONE 25 MG PO TABS Oral Take 1 tablet (25 mg total) by mouth daily. 30 tablet 2  . PLAVIX 75 MG PO TABS  TAKE 1 TABLET BY MOUTH ONCE DAILY 30 tablet 1  . QUINAPRIL HCL 40 MG PO TABS  TAKE 1 TABLET BY MOUTH ONCE DAILY 30 tablet 8    BP 127/58  Pulse 105  Temp(Src) 98.4 F (36.9 C) (Oral)  Resp 20  Ht 5\' 5"  (1.651 m)  Wt 135 lb (61.236 kg)  BMI 22.47 kg/m2  SpO2 97%  Physical Exam  Nursing note and vitals reviewed. Constitutional: He is oriented to person, place, and time. He appears well-developed and well-nourished. No distress.  HENT:  Head: Normocephalic and atraumatic.  Nose: Nose normal.  Mouth/Throat: Oropharynx is clear and moist. No oropharyngeal exudate.  Neck: Normal range of motion. Neck supple.  Cardiovascular: Normal rate and regular rhythm.  Exam reveals no friction rub.   No murmur heard. Pulmonary/Chest: Effort normal and breath sounds normal. No respiratory distress.  Abdominal: Soft. Bowel sounds are normal.  Musculoskeletal: Normal range of motion.  Lymphadenopathy:    He has no cervical adenopathy.  Neurological: He is alert and oriented to person, place, and time.  Skin: Skin is warm and dry.  He is not diaphoretic.    ED Course  Procedures (including critical care time)  Labs Reviewed - No data to display Ct Soft Tissue Neck W Contrast  04/29/2011  *RADIOLOGY REPORT*  Clinical Data: Sensation of object stuck at the left side of the throat; history of diabetes.  CT NECK WITH CONTRAST  Technique:  Multidetector CT imaging of the neck was performed with intravenous contrast.  Contrast: 75mL OMNIPAQUE IOHEXOL 300 MG/ML IV SOLN  Comparison: None.  Findings: The nasopharynx, oropharynx and hypopharynx are unremarkable in appearance.  There is no evidence of mass effect on either side.  No soft tissue edema is seen.  No fluid boluses are identified.  The valleculae and piriform sinuses are clear bilaterally. The  epiglottis is normal in thickness.  The vocal cords are grossly unremarkable in appearance.  The proximal trachea is within normal limits.  The visualized esophagus is empty.  The parapharyngeal fat planes are preserved.  There is no evidence of cervical lymphadenopathy.  No asymmetric soft tissue swelling is seen.  The parotid and submandibular glands are unremarkable in appearance.  There is no evidence of vascular compromise.  Calcification is noted at the carotid bifurcations bilaterally, more prominent on the left, without significant carotid stenosis. The remaining visualized vasculature is unremarkable in appearance, aside from scattered calcification along the proximal great vessels and aortic arch.  The thyroid gland is unremarkable in appearance.  The superior mediastinum is normal in appearance.  The visualized lung apices demonstrate mild emphysematous change, but are otherwise unremarkable.  The patient is status post median sternotomy.  Mucosal thickening is noted within the right maxillary sinus, and to a lesser extent at the right side of the sphenoid sinus.  The remaining visualized paranasal sinuses and mastoid air cells are well-aerated.  The minimally visualized portions of the orbits are unremarkable.  The visualized portions of the brain are within normal limits.  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  No significant periapical abscess is identified.  The mandible appears intact; the visualized portions of the maxilla are unremarkable.  Anterior osteophytes are noted along the cervical spine; the cervical spine is otherwise grossly unremarkable in appearance.  Visualized intervertebral disc spaces are preserved. Prevertebral soft tissues are within normal limits.  IMPRESSION:  1.  The nasopharynx, oropharynx and hypopharynx are unremarkable; the valleculae and piriform sinuses are clear.  No significant soft tissue abnormalities are seen.  The proximal esophagus  is empty. 2.  Calcification at the carotid bifurcations bilaterally, more prominent on the left, without significant carotid stenosis. 3.  Mucosal thickening within the right maxillary sinus, and to a lesser extent at the right side of the sphenoid sinus.  Original Report Authenticated By: Tonia Ghent, M.D.     No diagnosis found.    MDM  Physical exam unremarkable.  No stridor or signs of airway occlusion.  The cT shows no masses, blockages, only sinus thickening.  I am unsure if symptoms are due to sinus drainage, but I believe it is time to change the antibiotic.  Follow up with Dr. Lazarus Salines as needed if not improving.          Geoffery Lyons, MD 04/29/11 [id number]

## 2011-04-29 NOTE — ED Notes (Signed)
Into room to assess patient. Patient resting sitting up in bed. States he isn't having any pain but feels like something is in his throat that won't go down. Denies any respiratory difficulties or shortness of breath. Equal chest rise and fall, nonlabored. No stridor noted. Clear lung sounds. Denies nausea, vomiting. S1 and S2 present with no additional heart sounds. Call bell and daughter at bedside. In no distress. Will continue to monitor.

## 2011-04-30 ENCOUNTER — Telehealth: Payer: Self-pay | Admitting: Cardiology

## 2011-04-30 NOTE — Telephone Encounter (Signed)
PT DAUGHTER WOULD LIKE TO SPEAK WITH NURSE ABOUT PT BEING OFF HIS ASPRIN AND PLAVIX.  HE HAS BEING TREATED FOR NOSE BLEEDS. NOT ABLE TO START BACK ON MEDS TILL Saturday.

## 2011-05-01 ENCOUNTER — Telehealth: Payer: Self-pay | Admitting: *Deleted

## 2011-05-01 NOTE — Telephone Encounter (Signed)
Spoke with daughter and reassured her that starting Plavix and ASA tomorrow will be ok, as advised by the ED physician.  Made her aware to call me if any further nosebleed issues arise over the weekend.  Pt has return appointment with Dr Dietrich Pates in Feb and can certainly move appt up if needed.

## 2011-05-11 ENCOUNTER — Other Ambulatory Visit (HOSPITAL_COMMUNITY): Payer: Self-pay | Admitting: Internal Medicine

## 2011-05-12 ENCOUNTER — Encounter: Payer: Self-pay | Admitting: Cardiology

## 2011-05-13 ENCOUNTER — Ambulatory Visit (HOSPITAL_COMMUNITY): Payer: Medicare Other

## 2011-05-15 ENCOUNTER — Other Ambulatory Visit: Payer: Self-pay | Admitting: Cardiology

## 2011-06-16 ENCOUNTER — Other Ambulatory Visit: Payer: Self-pay | Admitting: Cardiology

## 2011-08-19 ENCOUNTER — Encounter: Payer: Self-pay | Admitting: *Deleted

## 2011-08-19 ENCOUNTER — Ambulatory Visit (INDEPENDENT_AMBULATORY_CARE_PROVIDER_SITE_OTHER): Payer: Medicare Other | Admitting: Physician Assistant

## 2011-08-19 ENCOUNTER — Encounter: Payer: Self-pay | Admitting: Physician Assistant

## 2011-08-19 DIAGNOSIS — E785 Hyperlipidemia, unspecified: Secondary | ICD-10-CM

## 2011-08-19 DIAGNOSIS — I2510 Atherosclerotic heart disease of native coronary artery without angina pectoris: Secondary | ICD-10-CM

## 2011-08-19 DIAGNOSIS — R0600 Dyspnea, unspecified: Secondary | ICD-10-CM | POA: Insufficient documentation

## 2011-08-19 DIAGNOSIS — R079 Chest pain, unspecified: Secondary | ICD-10-CM | POA: Insufficient documentation

## 2011-08-19 DIAGNOSIS — R0989 Other specified symptoms and signs involving the circulatory and respiratory systems: Secondary | ICD-10-CM

## 2011-08-19 DIAGNOSIS — I10 Essential (primary) hypertension: Secondary | ICD-10-CM

## 2011-08-19 MED ORDER — ISOSORBIDE MONONITRATE ER 30 MG PO TB24: 30.0000 mg | ORAL_TABLET | Freq: Every day | ORAL | Status: DC

## 2011-08-19 MED ORDER — CARVEDILOL 6.25 MG PO TABS: 6.2500 mg | ORAL_TABLET | Freq: Two times a day (BID) | ORAL | Status: DC

## 2011-08-19 NOTE — Assessment & Plan Note (Addendum)
Patient has chest pressure with some pain into both arms at rest and with exertion. It is difficult to get a history out of this patient. He does have history of CABG in 1992. Because of this we will schedule a Lexiscan. I will add low dose Coreg 6.25 mg b.i.d. He has a resting sinus tachycardia and hopefully this will help with that. I will also add Imdur 30 mg daily.

## 2011-08-19 NOTE — Assessment & Plan Note (Signed)
Blood pressure stable ? ?

## 2011-08-19 NOTE — Progress Notes (Signed)
HPI:  This is a 78 year old male patient who is here for yearly followup. He has a history of coronary artery disease status post CABG in 1992. He also has history of hypertension, hyperlipidemia, and history of TIA, and diabetes mellitus. He quit smoking in 1996. His last office visit with Dr. Dietrich Pates was on 07/20/10 which time he was doing well.   The patient comes in today complaining of several week history of shortness of breath and chest pressure. He is quite stoic and difficult to get a history from. He is accompanied by his daughter. He has chest pressure at different times and occasionally it goes into both arms. This can occur at rest or when he is moving around the house. He says it doesn't last long and hasn't used nitroglycerin. He also gets out of breath but has a history of asthma and thinks it might be related to that. When he walks to his mailbox he has to come back up a hill and he says his legs give out and he has cramps in his upper thighs when he is walking. Walking up this hill does not cause chest pressure or shortness of breath.  No Known Allergies  Current Outpatient Prescriptions on File Prior to Visit  Medication Sig Dispense Refill  . Albuterol Sulfate (PROAIR HFA IN) Inhale into the lungs as needed.        . chlorthalidone (HYGROTON) 25 MG tablet take 1 tablet by mouth once daily  30 tablet  2  . clopidogrel (PLAVIX) 75 MG tablet TAKE 1 TABLET BY MOUTH ONCE DAILY  30 tablet  3  . dutasteride (AVODART) 0.5 MG capsule Take 0.5 mg by mouth daily.        Marland Kitchen LORazepam (ATIVAN) 1 MG tablet Take 1 mg by mouth at bedtime.        . metFORMIN (GLUCOPHAGE) 500 MG tablet Take 500 mg by mouth 2 (two) times daily with a meal.        . quinapril (ACCUPRIL) 40 MG tablet TAKE 1 TABLET BY MOUTH ONCE DAILY  30 tablet  3  . simvastatin (ZOCOR) 40 MG tablet Take 40 mg by mouth at bedtime.          Past Medical History  Diagnosis Date  . Diabetes mellitus   . Coronary artery disease   .  Hypertension   . Myocardial infarction     Past Surgical History  Procedure Date  . Coronary artery bypass graft     1992    No family history on file.  History   Social History  . Marital Status: Married    Spouse Name: N/A    Number of Children: N/A  . Years of Education: N/A   Occupational History  . Not on file.   Social History Main Topics  . Smoking status: Former Smoker -- 40 years    Quit date: 06/20/1994  . Smokeless tobacco: Not on file  . Alcohol Use: No  . Drug Use: No  . Sexually Active:    Other Topics Concern  . Not on file   Social History Narrative  . No narrative on file    ROS:see history of present illness. Patient also has decreased appetite over the past few weeks but is only 1 pound less than it was a year ago. He is up most nights taking care of his wife who is ill.Wife also smokes in the house which he is exposed to. He quit smoking in 1996  PHYSICAL EXAM: Thin, elderly, in no acute distress. Neck: No JVD, HJR, Bruit, or thyroid enlargement Lungs: Decreased breath sounds throughout,No tachypnea, clear without wheezing, rales, or rhonchi Cardiovascular: RRR, PMI not displaced,2/6 systolic murmur at the left sternal border, no gallops, bruit, thrill, or heave. Abdomen: BS normal. Soft without organomegaly, masses, lesions or tenderness. Extremities: without cyanosis, clubbing or edema. Excellent distal pulses bilateral SKin: Warm, no lesions or rashes  Musculoskeletal: No deformities Neuro: no focal signs  BP 131/69  Pulse 100  Resp 16  Ht 5\' 6"  (1.676 m)  Wt 138 lb (62.596 kg)  BMI 22.27 kg/m2   NWG:NFAOZ tachycardia at 102 beats per minute otherwise normal

## 2011-08-19 NOTE — Assessment & Plan Note (Signed)
Patient complains of dyspnea on exertion off-and-on for the past couple weeks, although walking up a hill causes more leg cramps and it does shortness of breath. He is on an inhaler for asthma. We have ordered a Lexiscan as well as a 2-D echo for LV function and to rule out ischemia.

## 2011-08-19 NOTE — Patient Instructions (Addendum)
Your physician recommends that you schedule a follow-up appointment in: Dr Dietrich Pates within 2 weeks  Your physician has requested that you have an echocardiogram. Echocardiography is a painless test that uses sound waves to create images of your heart. It provides your doctor with information about the size and shape of your heart and how well your heart's chambers and valves are working. This procedure takes approximately one hour. There are no restrictions for this procedure.  Your physician has recommended you make the following change in your medication:  1 - START Imdur 30 mg daly 2 - START Coreg (Carvedilol) 6.25 mg twice a day  Your physician has requested that you have a lexiscan myoview. For further information please visit [email or web address] Please follow instruction sheet, as given.

## 2011-08-19 NOTE — Assessment & Plan Note (Signed)
Lipids checked by Dr. Margo Aye

## 2011-08-19 NOTE — Assessment & Plan Note (Signed)
CABG in 1992. Patient has not had stress tests since he's been coming here.

## 2011-09-02 ENCOUNTER — Ambulatory Visit (INDEPENDENT_AMBULATORY_CARE_PROVIDER_SITE_OTHER): Payer: Medicare Other

## 2011-09-02 ENCOUNTER — Encounter (HOSPITAL_COMMUNITY): Payer: Self-pay

## 2011-09-02 ENCOUNTER — Ambulatory Visit (HOSPITAL_COMMUNITY)
Admission: RE | Admit: 2011-09-02 | Discharge: 2011-09-02 | Disposition: A | Discharge status: Home / Self Care | Payer: Medicare Other | Source: Ambulatory Visit | Attending: Physician Assistant | Admitting: Physician Assistant

## 2011-09-02 ENCOUNTER — Encounter (HOSPITAL_COMMUNITY)
Admission: RE | Admit: 2011-09-02 | Discharge: 2011-09-02 | Disposition: A | Discharge status: Home / Self Care | Payer: Medicare Other | Source: Ambulatory Visit | Attending: Physician Assistant | Admitting: Physician Assistant

## 2011-09-02 ENCOUNTER — Encounter (HOSPITAL_COMMUNITY): Payer: Self-pay | Admitting: Cardiology

## 2011-09-02 DIAGNOSIS — E119 Type 2 diabetes mellitus without complications: Secondary | ICD-10-CM | POA: Insufficient documentation

## 2011-09-02 DIAGNOSIS — E785 Hyperlipidemia, unspecified: Secondary | ICD-10-CM | POA: Insufficient documentation

## 2011-09-02 DIAGNOSIS — R0600 Dyspnea, unspecified: Secondary | ICD-10-CM

## 2011-09-02 DIAGNOSIS — R0609 Other forms of dyspnea: Secondary | ICD-10-CM | POA: Insufficient documentation

## 2011-09-02 DIAGNOSIS — R079 Chest pain, unspecified: Secondary | ICD-10-CM | POA: Insufficient documentation

## 2011-09-02 DIAGNOSIS — I2510 Atherosclerotic heart disease of native coronary artery without angina pectoris: Secondary | ICD-10-CM

## 2011-09-02 DIAGNOSIS — R0989 Other specified symptoms and signs involving the circulatory and respiratory systems: Secondary | ICD-10-CM

## 2011-09-02 DIAGNOSIS — Z951 Presence of aortocoronary bypass graft: Secondary | ICD-10-CM | POA: Insufficient documentation

## 2011-09-02 DIAGNOSIS — I10 Essential (primary) hypertension: Secondary | ICD-10-CM | POA: Insufficient documentation

## 2011-09-02 DIAGNOSIS — I369 Nonrheumatic tricuspid valve disorder, unspecified: Secondary | ICD-10-CM

## 2011-09-02 MED ORDER — TECHNETIUM TC 99M TETROFOSMIN IV KIT
10.0000 | PACK | Freq: Once | INTRAVENOUS | Status: AC | PRN
  Administered 2011-09-02: 10 via INTRAVENOUS

## 2011-09-02 MED ORDER — TECHNETIUM TC 99M TETROFOSMIN IV KIT
30.0000 | PACK | Freq: Once | INTRAVENOUS | Status: AC | PRN
  Administered 2011-09-02: 32 via INTRAVENOUS

## 2011-09-02 NOTE — Progress Notes (Signed)
Stress Lab Nurses Notes - [facility]  MATTOX SCHORR 09/02/2011 Reason for doing test: CAD, Chest Pain and Dyspnea Type of test: Steffanie Dunn Nurse performing test: Parke Poisson, RN Nuclear Medicine Tech: Lyndel Pleasure Echo Tech: Not Applicable MD performing test: Ival Bible & Jacolyn Reedy PA Family MD: Dwana Melena Test explained and consent signed: yes IV started: 22g jelco, Saline lock flushed, No redness or edema and Saline lock started in radiology Symptoms: SOB & chest tightness Treatment/Intervention: None Reason test stopped: protocol completed After recovery IV was: Discontinued via X-ray tech and No redness or edema Patient to return to Nuc. Med at : 11:45 Patient discharged: Home Patient's Condition upon discharge was: stable Comments: During test BP 132/60 & HR 97.  Recovery BP 118/58 & HR 82.  Symptoms resolved in recovery. Erskine Speed T

## 2011-09-02 NOTE — Progress Notes (Signed)
*  PRELIMINARY RESULTS* Echocardiogram 2D Echocardiogram has been performed.  Conrad Cohutta 09/02/2011, 8:57 AM

## 2011-09-06 ENCOUNTER — Other Ambulatory Visit: Payer: Self-pay | Admitting: Cardiology

## 2011-09-07 ENCOUNTER — Encounter: Payer: Self-pay | Admitting: Cardiology

## 2011-09-07 ENCOUNTER — Ambulatory Visit: Payer: Medicare Other | Admitting: Cardiology

## 2011-09-07 ENCOUNTER — Ambulatory Visit (HOSPITAL_COMMUNITY)
Admission: RE | Admit: 2011-09-07 | Discharge: 2011-09-07 | Disposition: A | Discharge status: Home / Self Care | Payer: Medicare Other | Source: Ambulatory Visit | Attending: Cardiology | Admitting: Cardiology

## 2011-09-07 ENCOUNTER — Ambulatory Visit (INDEPENDENT_AMBULATORY_CARE_PROVIDER_SITE_OTHER): Payer: Medicare Other | Admitting: Cardiology

## 2011-09-07 VITALS — BP 117/58 | HR 80 | Ht 68.0 in | Wt 136.0 lb

## 2011-09-07 DIAGNOSIS — D649 Anemia, unspecified: Secondary | ICD-10-CM | POA: Insufficient documentation

## 2011-09-07 DIAGNOSIS — I77810 Thoracic aortic ectasia: Secondary | ICD-10-CM | POA: Insufficient documentation

## 2011-09-07 DIAGNOSIS — I2510 Atherosclerotic heart disease of native coronary artery without angina pectoris: Secondary | ICD-10-CM

## 2011-09-07 DIAGNOSIS — R918 Other nonspecific abnormal finding of lung field: Secondary | ICD-10-CM | POA: Insufficient documentation

## 2011-09-07 DIAGNOSIS — E785 Hyperlipidemia, unspecified: Secondary | ICD-10-CM

## 2011-09-07 DIAGNOSIS — I10 Essential (primary) hypertension: Secondary | ICD-10-CM | POA: Insufficient documentation

## 2011-09-07 DIAGNOSIS — E119 Type 2 diabetes mellitus without complications: Secondary | ICD-10-CM | POA: Insufficient documentation

## 2011-09-07 DIAGNOSIS — I517 Cardiomegaly: Secondary | ICD-10-CM | POA: Insufficient documentation

## 2011-09-07 DIAGNOSIS — Z87891 Personal history of nicotine dependence: Secondary | ICD-10-CM | POA: Insufficient documentation

## 2011-09-07 DIAGNOSIS — G459 Transient cerebral ischemic attack, unspecified: Secondary | ICD-10-CM | POA: Insufficient documentation

## 2011-09-07 DIAGNOSIS — Z720 Tobacco use: Secondary | ICD-10-CM | POA: Insufficient documentation

## 2011-09-07 MED ORDER — ATORVASTATIN CALCIUM 40 MG PO TABS: 40.0000 mg | ORAL_TABLET | Freq: Every day | ORAL | Status: DC

## 2011-09-07 NOTE — Progress Notes (Signed)
Patient ID: [name] [name], male   DOB: [birth year], 78 y.o.   MRN: [id number]  HPI: Scheduled return visit at a short interval for this pleasant gentleman with a history of coronary disease but no documented cardiac problems over the intervening 20 years.  When last seen, although history was uncertain, there was a question of exertional angina.  Exertional dyspnea was more prominent, but there was some associated chest discomfort.  Now, he appears to be doing better, without any clear chest discomfort and with modest exertional dyspnea.  Prior to Admission medications   Medication Sig Start Date End Date Taking? Authorizing Provider  Albuterol Sulfate (PROAIR HFA IN) Inhale into the lungs as needed.     Yes Historical Provider, MD  aspirin 81 MG tablet Take 81 mg by mouth daily.   Yes Historical Provider, MD  chlorthalidone (HYGROTON) 25 MG tablet take 1 tablet by mouth once daily 05/15/11  Yes Jodelle Gross, NP  dutasteride (AVODART) 0.5 MG capsule Take 0.5 mg by mouth daily.     Yes Historical Provider, MD  LORazepam (ATIVAN) 1 MG tablet Take 1 mg by mouth at bedtime.     Yes Historical Provider, MD  metFORMIN (GLUCOPHAGE) 500 MG tablet Take 500 mg by mouth 2 (two) times daily with a meal.     Yes Historical Provider, MD  quinapril (ACCUPRIL) 40 MG tablet TAKE 1 TABLET BY MOUTH ONCE DAILY 06/16/11  Yes Kathlen Brunswick, MD  Tamsulosin HCl (FLOMAX) 0.4 MG CAPS 0.4 mg daily.  07/18/11  Yes Historical Provider, MD  atorvastatin (LIPITOR) 40 MG tablet Take 1 tablet (40 mg total) by mouth daily. 09/07/11 09/06/12  Kathlen Brunswick, MD  No Known Allergies    Past medical history, social history, and family history reviewed and updated.  ROS: Denies orthopnea, PND, pedal edema, palpitations, lightheadedness or syncope.  All other systems reviewed and are negative.  PHYSICAL EXAM: BP 117/58  Pulse 80  Ht 5\' 8"  (1.727 m)  Wt 61.689 kg (136 lb)  BMI 20.68 kg/m2  General-Well developed; no acute  distress Body habitus-thin Neck-No JVD; no carotid bruits Lungs-Decreased breath sounds left base with few rales; resonant to percussion Cardiovascular-normal PMI; normal S1 and S2; prominent 4th heart sound Abdomen-normal bowel sounds; soft and non-tender without masses or organomegaly Musculoskeletal-No deformities, no cyanosis or clubbing Neurologic-Normal cranial nerves; symmetric strength and tone Skin-Warm, no significant lesions Extremities-distal pulses intact; no edema  ASSESSMENT AND PLAN:  [place] Bing, MD 09/07/2011 4:46 PM

## 2011-09-07 NOTE — Patient Instructions (Signed)
Your physician recommends that you schedule a follow-up appointment in: 6 MONTHS  Your physician recommends that you return for lab work in: TODAY  A chest x-ray takes a picture of the organs and structures inside the chest, including the heart, lungs, and blood vessels. This test can show several things, including, whether the heart is enlarges; whether fluid is building up in the lungs; and whether pacemaker / defibrillator leads are still in place.  Your physician has recommended you make the following change in your medication:  1 - STOP PLAVIX AFTER YOU FINISH CURRENT BOTTLE 2 - STOP SIMVASTATIN AFTER YOU COMPLETE CURRENT BOTTLE 3 - START ATORVASTATIN ONCE YOU HAVE STOPPED SIMVASTATIN 4 - STOP COREG 5 - STOP IMDUR  STOOLS X 3 FOR BLOOD AND RETURN TO THE OFFICE AS SOON AS POSSIBLE

## 2011-09-07 NOTE — Progress Notes (Signed)
Name: [name] [name]    DOB: 06/28/1930  Age: 78 y.o.  MR#: [id number]       PCP:  Dwana Melena, MD, MD      Insurance: @PAYORNAME @   CC:    Chief Complaint  Patient presents with  . NO COMPLAINTS    2 WEEK F/U - MEDS - LIST    VS BP 117/58  Pulse 80  Ht 5\' 8"  (1.727 m)  Wt 136 lb (61.689 kg)  BMI 20.68 kg/m2  Weights Current Weight  09/07/11 136 lb (61.689 kg)  08/19/11 138 lb (62.596 kg)  04/29/11 135 lb (61.236 kg)    Blood Pressure  BP Readings from Last 3 Encounters:  09/07/11 117/58  08/19/11 131/69  04/29/11 133/67     Admit date:  (Not on file) Last encounter with RMR:  06/16/2011   Allergy No Known Allergies  Current Outpatient Prescriptions  Medication Sig Dispense Refill  . Albuterol Sulfate (PROAIR HFA IN) Inhale into the lungs as needed.        Marland Kitchen aspirin 81 MG tablet Take 81 mg by mouth daily.      . carvedilol (COREG) 6.25 MG tablet Take 1 tablet (6.25 mg total) by mouth 2 (two) times daily with a meal.  60 tablet  12  . chlorthalidone (HYGROTON) 25 MG tablet take 1 tablet by mouth once daily  30 tablet  2  . clopidogrel (PLAVIX) 75 MG tablet TAKE 1 TABLET BY MOUTH ONCE DAILY  30 tablet  3  . dutasteride (AVODART) 0.5 MG capsule Take 0.5 mg by mouth daily.        . isosorbide mononitrate (IMDUR) 30 MG 24 hr tablet Take 1 tablet (30 mg total) by mouth daily.  30 tablet  12  . LORazepam (ATIVAN) 1 MG tablet Take 1 mg by mouth at bedtime.        . metFORMIN (GLUCOPHAGE) 500 MG tablet Take 500 mg by mouth 2 (two) times daily with a meal.        . quinapril (ACCUPRIL) 40 MG tablet TAKE 1 TABLET BY MOUTH ONCE DAILY  30 tablet  3  . simvastatin (ZOCOR) 40 MG tablet Take 40 mg by mouth at bedtime.        . Tamsulosin HCl (FLOMAX) 0.4 MG CAPS 0.4 mg daily.         Discontinued Meds:   There are no discontinued medications.  Patient Active Problem List  Diagnoses  . Dyspnea  . Diabetes mellitus, type 2  . Hypertension  . Arteriosclerotic cardiovascular disease  (ASCVD)  . Hyperlipidemia  . TIA (transient ischemic attack)  . Tobacco abuse    LABS No visits with results within 3 Month(s) from this visit. Latest known visit with results is:  Admission on 04/22/2011, Discharged on 04/22/2011  Component Date Value  . WBC 04/22/2011 7.8   . RBC 04/22/2011 3.38*  . Hemoglobin 04/22/2011 10.9*  . HCT 04/22/2011 32.4*  . MCV 04/22/2011 95.9   . [facility] 04/22/2011 32.2   . MCHC 04/22/2011 33.6   . RDW 04/22/2011 13.6   . Platelets 04/22/2011 175   . Neutrophils Relative 04/22/2011 73   . Neutro Abs 04/22/2011 5.6   . Lymphocytes Relative 04/22/2011 19   . Lymphs Abs 04/22/2011 1.5   . Monocytes Relative 04/22/2011 4   . Monocytes Absolute 04/22/2011 0.3   . Eosinophils Relative 04/22/2011 4   . Eosinophils Absolute 04/22/2011 0.3   . Basophils Relative 04/22/2011 0   .  Basophils Absolute 04/22/2011 0.0   . Sodium 04/22/2011 137   . Potassium 04/22/2011 3.8   . Chloride 04/22/2011 102   . CO2 04/22/2011 26   . Glucose, Bld 04/22/2011 141*  . BUN 04/22/2011 19   . Creatinine, Ser 04/22/2011 1.34   . Calcium 04/22/2011 9.8   . GFR calc non Af Amer 04/22/2011 49*  . GFR calc Af Amer 04/22/2011 57*  . Prothrombin Time 04/22/2011 13.7   . INR 04/22/2011 1.03   . aPTT 04/22/2011 35      Results for this Opt Visit:     Results for orders placed during the hospital encounter of 04/22/11  CBC      Component Value Range   WBC 7.8  4.0 - 10.5 (K/uL)   RBC 3.38 (*) 4.22 - 5.81 (MIL/uL)   Hemoglobin 10.9 (*) 13.0 - 17.0 (g/dL)   HCT 16.1 (*) 09.6 - 52.0 (%)   MCV 95.9  78.0 - 100.0 (fL)   MCH 32.2  26.0 - 34.0 (pg)   MCHC 33.6  30.0 - 36.0 (g/dL)   RDW 04.5  40.9 - 81.1 (%)   Platelets 175  150 - 400 (K/uL)  DIFFERENTIAL      Component Value Range   Neutrophils Relative 73  43 - 77 (%)   Neutro Abs 5.6  1.7 - 7.7 (K/uL)   Lymphocytes Relative 19  12 - 46 (%)   Lymphs Abs 1.5  0.7 - 4.0 (K/uL)   Monocytes Relative 4  3 - 12 (%)    Monocytes Absolute 0.3  0.1 - 1.0 (K/uL)   Eosinophils Relative 4  0 - 5 (%)   Eosinophils Absolute 0.3  0.0 - 0.7 (K/uL)   Basophils Relative 0  0 - 1 (%)   Basophils Absolute 0.0  0.0 - 0.1 (K/uL)  BASIC METABOLIC PANEL      Component Value Range   Sodium 137  135 - 145 (mEq/L)   Potassium 3.8  3.5 - 5.1 (mEq/L)   Chloride 102  96 - 112 (mEq/L)   CO2 26  19 - 32 (mEq/L)   Glucose, Bld 141 (*) 70 - 99 (mg/dL)   BUN 19  6 - 23 (mg/dL)   Creatinine, Ser 9.14  0.50 - 1.35 (mg/dL)   Calcium 9.8  8.4 - 78.2 (mg/dL)   GFR calc non Af Amer 49 (*) >90 (mL/min)   GFR calc Af Amer 57 (*) >90 (mL/min)  PROTIME-INR      Component Value Range   Prothrombin Time 13.7  11.6 - 15.2 (seconds)   INR 1.03  0.00 - 1.49   APTT      Component Value Range   aPTT 35  24 - 37 (seconds)    EKG Orders placed in visit on 08/19/11  . EKG 12-LEAD     Prior Assessment and Plan Problem List as of 09/07/2011          Cardiology Problems   Hypertension   Arteriosclerotic cardiovascular disease (ASCVD)   Hyperlipidemia   TIA (transient ischemic attack)     Other   Dyspnea   Last Assessment & Plan Note   08/19/2011 Office Visit Signed 08/19/2011  1:50 PM by Dyann Kief, PA    Patient complains of dyspnea on exertion off-and-on for the past couple weeks, although walking up a hill causes more leg cramps and it does shortness of breath. He is on an inhaler for asthma. We have ordered a Lexiscan as well  as a 2-D echo for LV function and to rule out ischemia.    Diabetes mellitus, type 2   Tobacco abuse       Imaging: Nm Myocar Single W/spect W/wall Motion And Ef  09/05/2011  nm myoview pharmacologic stress  Ordering Physician: Jacolyn Reedy  Reading Physician: [place] Bing  Clinical Data: 78 year old gentleman with a long history of coronary artery disease including CABG surgery in 1992 who now presents with exertional dyspnea and chest pressure.  NUCLEAR MEDICINE ADENOSINE STRESS MYOVIEW STUDY  WITH SPECT AND LEFT VENTRIUCLAR EJECTION FRACTION  Radionuclide Data: One-day rest/stress protocol performed with 10/30 mCi of Tc-2m Myoview.  Stress Data: Regadenoson infusion resulted in no symptoms.  There was a moderate increase in heart rate and a modest increase in systolic blood pressure following drug administration.  No arrhythmias noted.  EKG: Normal sinus rhythm, right ventricular conduction delay, otherwise normal.  No significant change following Regadenoson.  Scintigraphic Data: Acquisition performed with the arms down. Intense GI activity was noted adjacent to the cardiac apex resulting in improper normalization of the images.  Left ventricular size was normal.  On tomographic images reconstructed in standard planes, there was thinning in the region of the mid septum that was of borderline numeric significance.  By comparison to the resting portion of the study, minimal reversibility was identified.  Regional and global left ventricular systolic function was normal with an estimated ejection fraction of 66%.  There was normal systolic accentuation of activity throughout.  IMPRESSION: Negative pharmacologic stress nuclear myocardial study with some quality issues as noted above, a minimal defect that likely represents minor variability is in a right ventricular insertion artifact, but otherwise normal myocardial perfusion.  Original Report Authenticated By: Raelyn Mora Calculation: Score not calculated. Missing: Total Cholesterol

## 2011-09-07 NOTE — Assessment & Plan Note (Signed)
Lipid profile was excellent when last assessed in 2011.  In order to further reduce Mr. Blackard chance for recurrent symptomatic coronary disease, atorvastatin will be substituted for simvastatin a dose of 40 mg per day and a repeat lipid profile obtained.

## 2011-09-07 NOTE — Assessment & Plan Note (Signed)
Excellent blood pressure readings over the past 6 months with a downward trend.  Continue current medication.

## 2011-09-07 NOTE — Assessment & Plan Note (Addendum)
Testing is benign, and symptoms have improved.  No further evaluation appears to be required at present.  In the absence of demonstrable ischemia, carvedilol and isosorbide mononitrate will be discontinued.  He has been treated with clopidogrel for many years.  This medication will be discontinued as well

## 2011-09-07 NOTE — Assessment & Plan Note (Signed)
Mild to moderate anemia, not likely resulting in any symptoms.  Initial testing will be performed. If iron studies and stool Hemoccults are unremarkable, Dr. Margo Aye may wish to refer [name] [name] to a Hematologist.

## 2011-09-08 LAB — IRON AND TIBC
%SAT: 17 % — ABNORMAL LOW (ref 20–55)
TIBC: 439 ug/dL — ABNORMAL HIGH (ref 215–435)

## 2011-09-08 LAB — FERRITIN: Ferritin: 12 ng/mL — ABNORMAL LOW (ref 22–322)

## 2011-09-11 ENCOUNTER — Encounter (INDEPENDENT_AMBULATORY_CARE_PROVIDER_SITE_OTHER): Payer: Medicare Other

## 2011-09-11 ENCOUNTER — Other Ambulatory Visit: Payer: Self-pay

## 2011-09-11 DIAGNOSIS — Z7982 Long term (current) use of aspirin: Secondary | ICD-10-CM

## 2011-09-14 ENCOUNTER — Encounter: Payer: Self-pay | Admitting: Cardiology

## 2011-09-16 ENCOUNTER — Other Ambulatory Visit: Payer: Self-pay | Admitting: *Deleted

## 2011-09-16 MED ORDER — CHLORTHALIDONE 25 MG PO TABS: 25.0000 mg | ORAL_TABLET | Freq: Every day | ORAL | Status: DC

## 2011-10-11 ENCOUNTER — Other Ambulatory Visit: Payer: Self-pay | Admitting: Cardiology

## 2011-10-17 ENCOUNTER — Other Ambulatory Visit: Payer: Self-pay | Admitting: Cardiology

## 2012-02-11 ENCOUNTER — Other Ambulatory Visit: Payer: Self-pay | Admitting: Cardiology

## 2012-04-23 ENCOUNTER — Other Ambulatory Visit: Payer: Self-pay | Admitting: Cardiology

## 2012-06-10 ENCOUNTER — Other Ambulatory Visit: Payer: Self-pay | Admitting: Cardiology

## 2012-08-07 ENCOUNTER — Other Ambulatory Visit: Payer: Self-pay | Admitting: Cardiology

## 2012-10-04 ENCOUNTER — Other Ambulatory Visit: Payer: Self-pay | Admitting: Cardiology

## 2012-12-26 ENCOUNTER — Other Ambulatory Visit: Payer: Self-pay | Admitting: Cardiology

## 2013-02-18 ENCOUNTER — Other Ambulatory Visit: Payer: Self-pay | Admitting: Cardiology

## 2013-02-20 NOTE — Telephone Encounter (Signed)
Pt needs to make appointment for refills. Sent message to TS.

## 2013-06-02 ENCOUNTER — Encounter: Payer: Self-pay | Admitting: Adult Health

## 2013-06-02 ENCOUNTER — Ambulatory Visit (INDEPENDENT_AMBULATORY_CARE_PROVIDER_SITE_OTHER): Payer: Medicare Other | Admitting: Adult Health

## 2013-06-02 VITALS — BP 149/56 | HR 85 | Ht 66.0 in | Wt 142.0 lb

## 2013-06-02 DIAGNOSIS — I10 Essential (primary) hypertension: Secondary | ICD-10-CM

## 2013-06-02 DIAGNOSIS — I7090 Unspecified atherosclerosis: Secondary | ICD-10-CM

## 2013-06-02 DIAGNOSIS — G459 Transient cerebral ischemic attack, unspecified: Secondary | ICD-10-CM

## 2013-06-02 DIAGNOSIS — I2510 Atherosclerotic heart disease of native coronary artery without angina pectoris: Secondary | ICD-10-CM

## 2013-06-02 NOTE — Assessment & Plan Note (Signed)
He is without complaints of chest pain or cardiac symptoms. Will continue current medications. He is exhausted, taking care of his invalid wife. I will refer him to [facility] services for assistance with his wife.

## 2013-06-02 NOTE — Progress Notes (Deleted)
Name: [name] [name]    DOB: 21-Jan-1931  Age: 80 y.o.  MR#: [id number]       PCP:  Delphina Cahill, MD      Insurance: Payor: MEDICARE / Plan: MEDICARE PART A AND B / Product Type: *No Product type* /   CC:    Chief Complaint  Patient presents with  . Hypertension  . Coronary Artery Disease   PCP TOOK PT OFF METFORMIN AND CHLORTHALIDONE A MONTH AGO TO SEE HOW HIS KIDNEYS ARE DOING VS Filed Vitals:   06/02/13 1338  BP: 149/56  Pulse: 85  Height: 5\' 6"  (1.676 m)  Weight: 142 lb (64.411 kg)    Weights Current Weight  06/02/13 142 lb (64.411 kg)  09/07/11 136 lb (61.689 kg)  08/19/11 138 lb (62.596 kg)    Blood Pressure  BP Readings from Last 3 Encounters:  06/02/13 149/56  09/07/11 117/58  08/19/11 131/69     Admit date:  (Not on file) Last encounter with RMR:  Visit date not found   Allergy Review of patient's allergies indicates no known allergies.  Current Outpatient Prescriptions  Medication Sig Dispense Refill  . Albuterol Sulfate (PROAIR HFA IN) Inhale into the lungs as needed.        Marland Kitchen aspirin 81 MG tablet Take 81 mg by mouth daily.      . clonazePAM (KLONOPIN) 0.5 MG tablet       . dutasteride (AVODART) 0.5 MG capsule Take 0.5 mg by mouth daily.        Marland Kitchen LORazepam (ATIVAN) 1 MG tablet Take 1 mg by mouth at bedtime.        . quinapril (ACCUPRIL) 40 MG tablet take 1 tablet by mouth once daily  30 tablet  11  . simvastatin (ZOCOR) 40 MG tablet Take 40 mg by mouth daily at 6 PM.       . Tamsulosin HCl (FLOMAX) 0.4 MG CAPS 0.4 mg daily.       . chlorthalidone (HYGROTON) 25 MG tablet take 1 tablet by mouth once daily  15 tablet  0  . metFORMIN (GLUCOPHAGE) 500 MG tablet Take 500 mg by mouth 2 (two) times daily with a meal.         No current facility-administered medications for this visit.    Discontinued Meds:    Medications Discontinued During This Encounter  Medication Reason  . atorvastatin (LIPITOR) 40 MG tablet Error    Patient Active Problem List    Diagnosis Date Noted  . Anemia 09/07/2011  . Diabetes mellitus, type 2   . Hypertension   . Arteriosclerotic cardiovascular disease (ASCVD)   . Hyperlipidemia   . TIA (transient ischemic attack)   . Tobacco abuse   . Dyspnea 08/19/2011    LABS    Component Value Date/Time   NA 137 04/22/2011 0552   NA 140 12/16/2009   NA 138 07/23/2009   K 3.8 04/22/2011 0552   K 4.7 12/16/2009   K 4.7 07/23/2009   CL 102 04/22/2011 0552   CL 104 12/16/2009   CL 103 07/23/2009   CO2 26 04/22/2011 0552   CO2 26 12/16/2009   CO2 28 07/23/2009   GLUCOSE 141* 04/22/2011 0552   GLUCOSE 111 12/16/2009   GLUCOSE 85 07/23/2009   BUN 19 04/22/2011 0552   BUN 27 12/16/2009   BUN 21 07/23/2009   CREATININE 1.34 04/22/2011 0552   CREATININE 1.29 12/16/2009   CREATININE 1.35 07/23/2009   CALCIUM 9.8 04/22/2011 0552  CALCIUM 9.5 12/16/2009   CALCIUM 9.4 07/23/2009   GFRNONAA 49* 04/22/2011 0552   GFRNONAA 54 12/16/2009   GFRNONAA >60 07/23/2009   GFRAA 57* 04/22/2011 0552   CMP     Component Value Date/Time   NA 137 04/22/2011 0552   K 3.8 04/22/2011 0552   CL 102 04/22/2011 0552   CO2 26 04/22/2011 0552   GLUCOSE 141* 04/22/2011 0552   BUN 19 04/22/2011 0552   CREATININE 1.34 04/22/2011 0552   CALCIUM 9.8 04/22/2011 0552   PROT 7.1 12/16/2009   ALBUMIN 4.6 12/16/2009   AST 15 12/16/2009   ALT 10 12/16/2009   ALKPHOS 36 12/16/2009   GFRNONAA 49* 04/22/2011 0552   GFRAA 57* 04/22/2011 0552       Component Value Date/Time   WBC 7.8 04/22/2011 0552   HGB 10.9* 04/22/2011 0552   HCT 32.4* 04/22/2011 0552   MCV 95.9 04/22/2011 0552    Lipid Panel     Component Value Date/Time   CHOL 143 12/16/2009   TRIG 133 12/16/2009   HDL 44 12/16/2009   LDLCALC 72 12/16/2009    ABG No results found for this basename: phart, pco2, pco2art, po2, po2art, hco3, tco2, acidbasedef, o2sat     No results found for this basename: TSH   BNP (last 3 results) No results found for this basename: PROBNP,  in the last 8760  hours Cardiac Panel (last 3 results) No results found for this basename: CKTOTAL, CKMB, TROPONINI, RELINDX,  in the last 72 hours  Iron/TIBC/Ferritin    Component Value Date/Time   IRON 73 09/06/2011 1510   TIBC 439* 09/06/2011 1510   FERRITIN 12* 09/06/2011 1510     EKG Orders placed in visit on 06/02/13  . EKG 12-LEAD     Prior Assessment and Plan Problem List as of 06/02/2013   Dyspnea   Last Assessment & Plan   08/19/2011 Office Visit Written 08/19/2011  1:50 PM by Imogene Burn, PA     Patient complains of dyspnea on exertion off-and-on for the past couple weeks, although walking up a hill causes more leg cramps and it does shortness of breath. He is on an inhaler for asthma. We have ordered a Lexiscan as well as a 2-D echo for LV function and to rule out ischemia.    Diabetes mellitus, type 2   Hypertension   Last Assessment & Plan   09/07/2011 Office Visit Written 09/07/2011  2:30 PM by Yehuda Savannah, MD     Excellent blood pressure readings over the past 6 months with a downward trend.  Continue current medication.    Arteriosclerotic cardiovascular disease (ASCVD)   Last Assessment & Plan   09/07/2011 Office Visit Edited 09/07/2011  4:54 PM by Yehuda Savannah, MD     Testing is benign, and symptoms have improved.  No further evaluation appears to be required at present.  In the absence of demonstrable ischemia, carvedilol and isosorbide mononitrate will be discontinued.  He has been treated with clopidogrel for many years.  This medication will be discontinued as well    Hyperlipidemia   Last Assessment & Plan   09/07/2011 Office Visit Written 09/07/2011  4:56 PM by Yehuda Savannah, MD     Lipid profile was excellent when last assessed in 2011.  In order to further reduce Mr. Ahuna chance for recurrent symptomatic coronary disease, atorvastatin will be substituted for simvastatin a dose of 40 mg per day and a repeat lipid profile obtained.  TIA (transient ischemic  attack)   Tobacco abuse   Anemia   Last Assessment & Plan   09/07/2011 Office Visit Written 09/07/2011  4:58 PM by Yehuda Savannah, MD     Mild to moderate anemia, not likely resulting in any symptoms.  Initial testing will be performed. If iron studies and stool Hemoccults are unremarkable, Dr. [place] Crane may wish to refer Mr. Hofferber to a Hematologist.        Imaging: No results found.

## 2013-06-02 NOTE — Progress Notes (Signed)
HPI: [name] [name] is an 80 year old former patient of Dr. Lattie Haw we are following for ongoing assessment and management of CAD, hypertension, with history of hyperlipidemia and anemia. He was last seen in the office in April of 2013 has been lost to followup since that time. The last office visit the patient's notes per Dr. Lattie Haw demonstrated recent stress test which demonstrated no discernible ischemia. Carvedilol, Plavix, isosorbide was discontinued. He was continued on statin therapy.    He has been taken off of his metformin and chlorthaldone due to renal insufficiency. Labs are being drawn by Dr. [place] Crane on 1/28. He is exhausted taking care of his invalid wife. Daughter is helpful but works. He denies chest pain or DOE.           No Known Allergies  Current Outpatient Prescriptions  Medication Sig Dispense Refill  . Albuterol Sulfate (PROAIR HFA IN) Inhale into the lungs as needed.        Marland Kitchen aspirin 81 MG tablet Take 81 mg by mouth daily.      . clonazePAM (KLONOPIN) 0.5 MG tablet       . dutasteride (AVODART) 0.5 MG capsule Take 0.5 mg by mouth daily.        Marland Kitchen LORazepam (ATIVAN) 1 MG tablet Take 1 mg by mouth at bedtime.        . quinapril (ACCUPRIL) 40 MG tablet take 1 tablet by mouth once daily  30 tablet  11  . simvastatin (ZOCOR) 40 MG tablet Take 40 mg by mouth daily at 6 PM.       . Tamsulosin HCl (FLOMAX) 0.4 MG CAPS 0.4 mg daily.       . chlorthalidone (HYGROTON) 25 MG tablet take 1 tablet by mouth once daily  15 tablet  0  . metFORMIN (GLUCOPHAGE) 500 MG tablet Take 500 mg by mouth 2 (two) times daily with a meal.         No current facility-administered medications for this visit.    Past Medical History  Diagnosis Date  . Diabetes mellitus, type 2   . Hypertension   . Arteriosclerotic cardiovascular disease (ASCVD) 1992    CABG surgery-1992  . Hyperlipidemia   . TIA (transient ischemic attack)   . Tobacco abuse     40 pack years; discontinued in 1996  . Pneumonia    . Benign prostatic hypertrophy   . Anemia 09/07/2011    H&H of 10.9/32.4 in 03/2011 with a normal MCV    Past Surgical History  Procedure Laterality Date  . Coronary artery bypass graft  1992  . Tonsillectomy    . Hernia repair    . Cataract extraction      Bilateral  . Colonoscopy  Never    ROS: Review of systems complete and found to be negative unless listed above  PHYSICAL EXAM BP 149/56  Pulse 85  Ht 5\' 6"  (1.676 m)  Wt 142 lb (64.411 kg)  BMI 22.93 kg/m2  General: Well developed, well nourished, in no acute distress Head: Eyes PERRLA, No xanthomas.   Normal cephalic and atramatic  Lungs: Clear bilaterally to auscultation and percussion. Heart: HRRR S1 S2, without MRG.  Pulses are 2+ & equal.            No carotid bruit. No JVD.  No abdominal bruits. No femoral bruits. Abdomen: Bowel sounds are positive, abdomen soft and non-tender without masses or    Hernia's noted. Msk:  Back normal, normal gait. Normal strength and  tone for age. Extremities: No clubbing, cyanosis or edema.  DP +1 Neuro: Alert and oriented X 3. Psych:  Good affect, responds appropriately  EKG: NSR with 82 bpm.  ASSESSMENT AND PLAN

## 2013-06-02 NOTE — Assessment & Plan Note (Signed)
He is essentially well controlled. I will not make any changes at this time until labs are made available. Will not restart diuretic.

## 2013-06-02 NOTE — Patient Instructions (Addendum)
Your physician recommends that you schedule a follow-up appointment in: 6 months You will receive a reminder letter two months in advance reminding you to call and schedule your appointment. If you don't receive this letter, please contact our office.  Your physician recommends that you continue on your current medications as directed. Please refer to the Current Medication list given to you today.     

## 2013-07-21 ENCOUNTER — Telehealth: Payer: Self-pay | Admitting: *Deleted

## 2013-07-21 MED ORDER — CHLORTHALIDONE 25 MG PO TABS: ORAL_TABLET | ORAL | Status: DC

## 2013-07-21 NOTE — Telephone Encounter (Signed)
Medication sent via escribe.  

## 2013-07-21 NOTE — Telephone Encounter (Signed)
Faxed refill for chlorthalidone 25 mg  from [facility] f [phone] p [phone]

## 2013-07-26 ENCOUNTER — Other Ambulatory Visit: Payer: Self-pay | Admitting: Cardiology

## 2013-10-14 ENCOUNTER — Other Ambulatory Visit: Payer: Self-pay | Admitting: Cardiology

## 2013-12-07 ENCOUNTER — Encounter: Payer: Medicare Other | Admitting: Adult Health

## 2013-12-07 NOTE — Progress Notes (Signed)
    Error-Appt Cancelled

## 2013-12-26 ENCOUNTER — Encounter: Payer: Self-pay | Admitting: Adult Health

## 2013-12-26 ENCOUNTER — Ambulatory Visit (INDEPENDENT_AMBULATORY_CARE_PROVIDER_SITE_OTHER): Payer: Medicare Other | Admitting: Adult Health

## 2013-12-26 VITALS — BP 108/52 | HR 83 | Ht 65.0 in | Wt 147.0 lb

## 2013-12-26 DIAGNOSIS — G459 Transient cerebral ischemic attack, unspecified: Secondary | ICD-10-CM

## 2013-12-26 DIAGNOSIS — I2510 Atherosclerotic heart disease of native coronary artery without angina pectoris: Secondary | ICD-10-CM

## 2013-12-26 DIAGNOSIS — R0602 Shortness of breath: Secondary | ICD-10-CM

## 2013-12-26 DIAGNOSIS — I7090 Unspecified atherosclerosis: Secondary | ICD-10-CM

## 2013-12-26 LAB — BASIC METABOLIC PANEL
BUN: 42 mg/dL — ABNORMAL HIGH (ref 6–23)
CALCIUM: 9.1 mg/dL (ref 8.4–10.5)
CHLORIDE: 103 meq/L (ref 96–112)
CO2: 24 meq/L (ref 19–32)
CREATININE: 2.19 mg/dL — AB (ref 0.50–1.35)
GLUCOSE: 98 mg/dL (ref 70–99)
Potassium: 5.1 mEq/L (ref 3.5–5.3)
Sodium: 136 mEq/L (ref 135–145)

## 2013-12-26 MED ORDER — QUINAPRIL HCL 20 MG PO TABS: ORAL_TABLET | ORAL | Status: DC

## 2013-12-26 NOTE — Assessment & Plan Note (Signed)
He is hypotensive on triage exam. Orthostatics are completed. BP shows  110/70 lying, 92/58 sitting, and 94/60 standing. I will decrease quinlapril to 20 mg daily, d/c HCTZ.   BMET to evaluate potassium and renal function with cramping in the legs at night. Good pulses, I doubt.  He will come back in a couple of weeks to be seen on follow up to assess symptoms and BP with medication changes.

## 2013-12-26 NOTE — Assessment & Plan Note (Signed)
Chronic for him with bilateral crackles noted. Will have CXR completed to evaluate. May consider repeating echo on next visit.  Echo was last completed in 2013 with normal EF 55%-60%  In the setting of dyspnea.

## 2013-12-26 NOTE — Progress Notes (Deleted)
Name: [name] [name]    DOB: [birth year]  Age: 80 y.o.  MR#: [id number]       PCP:  Delphina Cahill, MD      Insurance: Payor: MEDICARE / Plan: MEDICARE PART A AND B / Product Type: *No Product type* /   CC:    Chief Complaint  Patient presents with  . Coronary Artery Disease  . Hypertension    VS Filed Vitals:   12/26/13 1247  BP: 108/52  Pulse: 83  Height: 5\' 5"  (1.651 m)  Weight: 147 lb (66.679 kg)    Weights Current Weight  12/26/13 147 lb (66.679 kg)  06/02/13 142 lb (64.411 kg)  09/07/11 136 lb (61.689 kg)    Blood Pressure  BP Readings from Last 3 Encounters:  12/26/13 108/52  06/02/13 149/56  09/07/11 117/58     Admit date:  (Not on file) Last encounter with RMR:  Visit date not found   Allergy Review of patient's allergies indicates no known allergies.  Current Outpatient Prescriptions  Medication Sig Dispense Refill  . Albuterol Sulfate (PROAIR HFA IN) Inhale into the lungs as needed.        Marland Kitchen aspirin 81 MG tablet Take 81 mg by mouth daily.      . chlorthalidone (HYGROTON) 25 MG tablet 12.5 mg. take 1/2 tab daily      . LORazepam (ATIVAN) 1 MG tablet Take 1 mg by mouth at bedtime.        . metFORMIN (GLUCOPHAGE) 500 MG tablet Take 500 mg by mouth 2 (two) times daily with a meal.        . quinapril (ACCUPRIL) 40 MG tablet take 1 tablet by mouth once daily  30 tablet  9  . simvastatin (ZOCOR) 40 MG tablet Take 40 mg by mouth daily at 6 PM.       . Tamsulosin HCl (FLOMAX) 0.4 MG CAPS 0.4 mg daily.        No current facility-administered medications for this visit.    Discontinued Meds:    Medications Discontinued During This Encounter  Medication Reason  . chlorthalidone (HYGROTON) 25 MG tablet Error  . clonazePAM (KLONOPIN) 0.5 MG tablet Error  . chlorthalidone (HYGROTON) 25 MG tablet   . dutasteride (AVODART) 0.5 MG capsule Error    Patient Active Problem List   Diagnosis Date Noted  . Anemia 09/07/2011  . Diabetes mellitus, type 2   . Hypertension    . Arteriosclerotic cardiovascular disease (ASCVD)   . Hyperlipidemia   . TIA (transient ischemic attack)   . Tobacco abuse   . Dyspnea 08/19/2011    LABS    Component Value Date/Time   NA 137 04/22/2011 0552   NA 140 12/16/2009   NA 138 07/23/2009   K 3.8 04/22/2011 0552   K 4.7 12/16/2009   K 4.7 07/23/2009   CL 102 04/22/2011 0552   CL 104 12/16/2009   CL 103 07/23/2009   CO2 26 04/22/2011 0552   CO2 26 12/16/2009   CO2 28 07/23/2009   GLUCOSE 141* 04/22/2011 0552   GLUCOSE 111 12/16/2009   GLUCOSE 85 07/23/2009   BUN 19 04/22/2011 0552   BUN 27 12/16/2009   BUN 21 07/23/2009   CREATININE 1.34 04/22/2011 0552   CREATININE 1.29 12/16/2009   CREATININE 1.35 07/23/2009   CALCIUM 9.8 04/22/2011 0552   CALCIUM 9.5 12/16/2009   CALCIUM 9.4 07/23/2009   GFRNONAA 49* 04/22/2011 0552   GFRNONAA 54 12/16/2009   GFRNONAA >60 07/23/2009  GFRAA 57* 04/22/2011 0552   CMP     Component Value Date/Time   NA 137 04/22/2011 0552   K 3.8 04/22/2011 0552   CL 102 04/22/2011 0552   CO2 26 04/22/2011 0552   GLUCOSE 141* 04/22/2011 0552   BUN 19 04/22/2011 0552   CREATININE 1.34 04/22/2011 0552   CALCIUM 9.8 04/22/2011 0552   PROT 7.1 12/16/2009   ALBUMIN 4.6 12/16/2009   AST 15 12/16/2009   ALT 10 12/16/2009   ALKPHOS 36 12/16/2009   GFRNONAA 49* 04/22/2011 0552   GFRAA 57* 04/22/2011 0552       Component Value Date/Time   WBC 7.8 04/22/2011 0552   HGB 10.9* 04/22/2011 0552   HCT 32.4* 04/22/2011 0552   MCV 95.9 04/22/2011 0552    Lipid Panel     Component Value Date/Time   CHOL 143 12/16/2009   TRIG 133 12/16/2009   HDL 44 12/16/2009   LDLCALC 72 12/16/2009    ABG No results found for this basename: phart, pco2, pco2art, po2, po2art, hco3, tco2, acidbasedef, o2sat     No results found for this basename: TSH   BNP (last 3 results) No results found for this basename: PROBNP,  in the last 8760 hours Cardiac Panel (last 3 results) No results found for this basename: CKTOTAL, CKMB,  TROPONINI, RELINDX,  in the last 72 hours  Iron/TIBC/Ferritin/ %Sat    Component Value Date/Time   IRON 73 09/06/2011 1510   TIBC 439* 09/06/2011 1510   FERRITIN 12* 09/06/2011 1510   IRONPCTSAT 17* 09/06/2011 1510     EKG Orders placed in visit on 06/02/13  . EKG 12-LEAD     Prior Assessment and Plan Problem List as of 12/26/2013     Cardiovascular and Mediastinum   Hypertension   Last Assessment & Plan   06/02/2013 Office Visit Written 06/02/2013  2:14 PM by Lendon Colonel, NP     He is essentially well controlled. I will not make any changes at this time until labs are made available. Will not restart diuretic.     Arteriosclerotic cardiovascular disease (ASCVD)   Last Assessment & Plan   06/02/2013 Office Visit Written 06/02/2013  2:12 PM by Lendon Colonel, NP     He is without complaints of chest pain or cardiac symptoms. Will continue current medications. He is exhausted, taking care of his invalid wife. I will refer him to [facility] services for assistance with his wife.     TIA (transient ischemic attack)     Endocrine   Diabetes mellitus, type 2     Other   Dyspnea   Last Assessment & Plan   08/19/2011 Office Visit Written 08/19/2011  1:50 PM by Imogene Burn, PA     Patient complains of dyspnea on exertion off-and-on for the past couple weeks, although walking up a hill causes more leg cramps and it does shortness of breath. He is on an inhaler for asthma. We have ordered a Lexiscan as well as a 2-D echo for LV function and to rule out ischemia.    Hyperlipidemia   Last Assessment & Plan   09/07/2011 Office Visit Written 09/07/2011  4:56 PM by Yehuda Savannah, MD     Lipid profile was excellent when last assessed in 2011.  In order to further reduce Mr. Timothy chance for recurrent symptomatic coronary disease, atorvastatin will be substituted for simvastatin a dose of 40 mg per day and a repeat lipid profile obtained.  Tobacco abuse   Anemia   Last Assessment & Plan    09/07/2011 Office Visit Written 09/07/2011  4:58 PM by Yehuda Savannah, MD     Mild to moderate anemia, not likely resulting in any symptoms.  Initial testing will be performed. If iron studies and stool Hemoccults are unremarkable, Dr. Nevada Crane may wish to refer Mr. Para to a Hematologist.        Imaging: No results found.

## 2013-12-26 NOTE — Patient Instructions (Addendum)
Your physician recommends that you schedule a follow-up appointment in: 2 weeks  Your physician recommends that you return for lab work for a BMET  Your physician has recommended you make the following change in your medication:   DECREASE YOUR QUINAPRIL TO 20 [place] physician recommends that you continue on all other current medications as directed. Please refer to the Current Medication list given to you today.  A chest x-ray takes a picture of the organs and structures inside the chest, including the heart, lungs, and blood vessels. This test can show several things, including, whether the heart is enlarges; whether fluid is building up in the lungs; and whether pacemaker / defibrillator leads are still in place.   Thank you for choosing [facility]!!

## 2013-12-26 NOTE — Assessment & Plan Note (Signed)
Complaining of overall fatigue and DOE. If not improved with adjustments in antihypertensive medications to allow for more normal values would proceed with repeat stress test.

## 2013-12-26 NOTE — Progress Notes (Signed)
    HPI: [name] is an 80 year old patient to be est. with Dr. Pennelope [name] or Dr. Harl [name] we are following for ongoing assessment and management of CAD, hypertension, with history of hyperlipidemia and anemia. He was last seen in the office in January 2015. Time the patient was stable, tired from taking care of an invalid wife, but without cardiac complaint. He was referred to [facility] services for assistance with his wife. Labs followed by primary care Dr. Nevada [name].  He comes today complaining of generalized fatigue, and bilateral leg and hip pain with walking. He states he is having cramping in his legs at night. Some dizziness, especially with bending over.   No Known Allergies  Current Outpatient Prescriptions  Medication Sig Dispense Refill  . Albuterol Sulfate (PROAIR HFA IN) Inhale into the lungs as needed.        Marland Kitchen aspirin 81 MG tablet Take 81 mg by mouth daily.      . chlorthalidone (HYGROTON) 25 MG tablet 12.5 mg. take 1/2 tab daily      . LORazepam (ATIVAN) 1 MG tablet Take 1 mg by mouth at bedtime.        . metFORMIN (GLUCOPHAGE) 500 MG tablet Take 500 mg by mouth 2 (two) times daily with a meal.        . quinapril (ACCUPRIL) 40 MG tablet take 1 tablet by mouth once daily  30 tablet  9  . simvastatin (ZOCOR) 40 MG tablet Take 40 mg by mouth daily at 6 PM.       . Tamsulosin HCl (FLOMAX) 0.4 MG CAPS 0.4 mg daily.        No current facility-administered medications for this visit.    Past Medical History  Diagnosis Date  . Diabetes mellitus, type 2   . Hypertension   . Arteriosclerotic cardiovascular disease (ASCVD) 1992    CABG surgery-1992  . Hyperlipidemia   . TIA (transient ischemic attack)   . Tobacco abuse     40 pack years; discontinued in 1996  . Pneumonia   . Benign prostatic hypertrophy   . Anemia 09/07/2011    H&H of 10.9/32.4 in 03/2011 with a normal MCV    Past Surgical History  Procedure Laterality Date  . Coronary artery bypass graft  1992  . Tonsillectomy    .  Hernia repair    . Cataract extraction      Bilateral  . Colonoscopy  Never    ROS: Review of systems complete and found to be negative unless listed above  PHYSICAL EXAM BP 108/52  Pulse 83  Ht 5\' 5"  (1.651 m)  Wt 147 lb (66.679 kg)  BMI 24.46 kg/m2 General: Well developed, well nourished, in no acute distress Head: Eyes PERRLA, No xanthomas.   Normal cephalic and atramatic  Lungs: Bilateral crackles, not cleared with coughing. Heart: HRRR S1 S2, without MRG.  Pulses are 2+ & equal.            No carotid bruit. No JVD.  No abdominal bruits. No femoral bruits. Abdomen: Bowel sounds are positive, abdomen soft and non-tender without masses or                  Hernia's noted. Msk:  Back normal, normal gait. Normal strength and tone for age. Extremities: No clubbing, cyanosis or edema.  DP +1 Neuro: Alert and oriented X 3. Psych:  Good affect, responds appropriately   ASSESSMENT AND PLAN

## 2013-12-27 ENCOUNTER — Telehealth: Payer: Self-pay | Admitting: *Deleted

## 2013-12-27 NOTE — Telephone Encounter (Signed)
Spoke with pt daughter and she will start mag 200 mg daily. Daughter also wanted to know why [facility] had not reached out to them to help with her parents. Referral stated that they received fax in January and posted a note that they would follow up within 10 days and that was 06/02/2013. Gave the daughter the name and number of contact person for [facility] that was on the referral faxed back this office

## 2013-12-27 NOTE — Telephone Encounter (Signed)
Message copied by Desma Mcgregor on Wed Dec 27, 2013  4:17 PM ------      Message from: Lendon Colonel      Created: Wed Dec 27, 2013  1:13 PM       Already decreased his quinlapril to 20 mg. This will assist in creatinine level. Add magnesium 200 mg daily to his medications to assist with leg cramps. ------

## 2013-12-28 ENCOUNTER — Ambulatory Visit (HOSPITAL_COMMUNITY)
Admission: RE | Admit: 2013-12-28 | Discharge: 2013-12-28 | Disposition: A | Discharge status: Home / Self Care | Payer: Medicare Other | Source: Ambulatory Visit | Attending: Adult Health | Admitting: Adult Health

## 2013-12-28 DIAGNOSIS — R0602 Shortness of breath: Secondary | ICD-10-CM | POA: Insufficient documentation

## 2013-12-28 DIAGNOSIS — R918 Other nonspecific abnormal finding of lung field: Secondary | ICD-10-CM | POA: Diagnosis not present

## 2014-01-08 ENCOUNTER — Encounter: Payer: Self-pay | Admitting: Cardiology

## 2014-01-09 ENCOUNTER — Ambulatory Visit (INDEPENDENT_AMBULATORY_CARE_PROVIDER_SITE_OTHER): Payer: Medicare Other | Admitting: Cardiology

## 2014-01-09 ENCOUNTER — Encounter: Payer: Self-pay | Admitting: Cardiology

## 2014-01-09 VITALS — BP 118/62 | HR 78 | Ht 65.0 in | Wt 148.0 lb

## 2014-01-09 DIAGNOSIS — M79604 Pain in right leg: Secondary | ICD-10-CM | POA: Insufficient documentation

## 2014-01-09 DIAGNOSIS — R9389 Abnormal findings on diagnostic imaging of other specified body structures: Secondary | ICD-10-CM | POA: Insufficient documentation

## 2014-01-09 DIAGNOSIS — M79605 Pain in left leg: Secondary | ICD-10-CM

## 2014-01-09 DIAGNOSIS — M79609 Pain in unspecified limb: Secondary | ICD-10-CM

## 2014-01-09 DIAGNOSIS — I7090 Unspecified atherosclerosis: Secondary | ICD-10-CM

## 2014-01-09 DIAGNOSIS — I2510 Atherosclerotic heart disease of native coronary artery without angina pectoris: Secondary | ICD-10-CM

## 2014-01-09 DIAGNOSIS — N189 Chronic kidney disease, unspecified: Secondary | ICD-10-CM | POA: Insufficient documentation

## 2014-01-09 DIAGNOSIS — N289 Disorder of kidney and ureter, unspecified: Secondary | ICD-10-CM

## 2014-01-09 DIAGNOSIS — I10 Essential (primary) hypertension: Secondary | ICD-10-CM

## 2014-01-09 NOTE — Assessment & Plan Note (Signed)
The patient is not reporting progressive shortness of breath or angina symptoms. He had a reassuring ischemic workup within the last 2 years. Continue observation for now.

## 2014-01-09 NOTE — Progress Notes (Signed)
Clinical Summary [name] [name] is an 80 y.o.male recently seen in the office by Ms. Lawrence NP on August 4. He is a former patient of Dr. Lattie Haw, and this is our first meeting in the office. At the recent visit he was complaining of fatigue associated with shortness of breath, also leg pain and dizziness. He was noted to be mildly orthostatic resulting in a decrease in quinapril dose to 20 mg daily and discontinuation of HCTZ. Followup BMET did show deterioration in renal function with creatinine increasing from 1.3 two years ago up to 2.1, BUN 42, and potassium 5.1. Ms. Purcell Nails NP also obtained a chest x-ray which was abnormal describing worsening hazy nodular consolidation in the left midlung compared to prior films, with recommendation for CT scan for better definition.  He is here today with his daughter. We discussed his medical history and recent testing results. [name] [name] main complaint is of intermittent leg pain and weakness, also hip pain. This is not an everyday occurrence. Some description consistent with claudication, otherwise could potentially be arthritic. His daughter brought up whether Zocor might be related.  He does not endorse any angina symptoms, has not required nitroglycerin. Does not report any fevers or chills, cough or hemoptysis.  Echocardiogram from April 2013 revealed mild LVH with LVEF 55-60%, basal inferoseptal hypokinesis, grade 1 diastolic dysfunction, sclerotic aortic valve with mild aortic regurgitation, mild mitral regurgitation, mild left atrial enlargement, mild to moderate tricuspid regurgitation with PASP 45 mm mercury. Lexiscan Cardiolite from March 2013 showed no clear evidence of ischemia with LVEF 66%.  No Known Allergies  Current Outpatient Prescriptions  Medication Sig Dispense Refill  . Albuterol Sulfate (PROAIR HFA IN) Inhale into the lungs as needed.        Marland Kitchen aspirin 81 MG tablet Take 81 mg by mouth daily.      Marland Kitchen LORazepam (ATIVAN) 1 MG tablet  Take 1 mg by mouth at bedtime.        . Magnesium Oxide (MAG-200 PO) Take by mouth.      . quinapril (ACCUPRIL) 20 MG tablet take 1 tablet by mouth once daily  30 tablet  9  . Tamsulosin HCl (FLOMAX) 0.4 MG CAPS 0.4 mg daily.        No current facility-administered medications for this visit.    Past Medical History  Diagnosis Date  . Diabetes mellitus, type 2   . Essential hypertension, benign   . Coronary atherosclerosis of native coronary artery 1992    Multivessel s/p CABG 1992  . Hyperlipidemia   . TIA (transient ischemic attack)   . History of pneumonia   . Benign prostatic hypertrophy   . Chronic anemia     Past Surgical History  Procedure Laterality Date  . Coronary artery bypass graft  1992  . Tonsillectomy    . Hernia repair    . Cataract extraction Bilateral     Social History [name] [name] reports that he quit smoking about 19 years ago. His smoking use included Cigarettes. He smoked 0.00 packs per day for 40 years. He does not have any smokeless tobacco history on file. [name] [name] reports that he does not drink alcohol.  Review of Systems Other systems reviewed and negative except as outlined.  Physical Examination Filed Vitals:   01/09/14 1308  BP: 118/62  Pulse: 78   Filed Weights   01/09/14 1308  Weight: 148 lb (67.132 kg)   Elderly male, appears comfortable at rest. HEENT: Conjunctiva and lids normal,  oropharynx clear. Neck: Supple, no elevated JVP or carotid bruits, no thyromegaly. Lungs: Coarse breath sounds, decreased, nonlabored breathing at rest. Cardiac: Regular rate and rhythm, no S3, soft systolic murmur, no pericardial rub. Abdomen: Soft, nontender, bowel sounds present, no guarding or rebound. Extremities: No pitting edema, distal pulses 1+. Skin: Warm and dry. Musculoskeletal: Mild kyphosis. Neuropsychiatric: Alert and oriented x3, affect grossly appropriate.   Problem List and Plan   Leg pain, bilateral At this point our plan is to  discontinue Zocor for the next few weeks to see if he notices any improvement. He should call back to let us know. We will also obtain lower extremity ABIs and arterial Dopplers to exclude arterial insufficiency and claudication.  Acute on chronic renal insufficiency ACE inhibitor dose was reduced and he was taken off HCTZ. Also plans to stop metformin for now and followup with Dr. Nevada Crane. He will need a BMET for his next visit in the next 3 weeks.  Abnormal chest x-ray Recent report indicates progressive hazy nodular consolidation in the left midlung compared to previous study. Will obtain a noncontrasted chest CT to hopefully better define this process. If we eventually need to repeat a contrasted study, would first need to followup renal function.  Coronary atherosclerosis of native coronary artery The patient is not reporting progressive shortness of breath or angina symptoms. He had a reassuring ischemic workup within the last 2 years. Continue observation for now.  Essential hypertension, benign Blood pressure control looks good today.    Satira Sark, M.D., F.A.C.C.

## 2014-01-09 NOTE — Assessment & Plan Note (Signed)
At this point our plan is to discontinue Zocor for the next few weeks to see if he notices any improvement. He should call back to let us know. We will also obtain lower extremity ABIs and arterial Dopplers to exclude arterial insufficiency and claudication.

## 2014-01-09 NOTE — Assessment & Plan Note (Signed)
ACE inhibitor dose was reduced and he was taken off HCTZ. Also plans to stop metformin for now and followup with Dr. Nevada Crane. He will need a BMET for his next visit in the next 3 weeks.

## 2014-01-09 NOTE — Assessment & Plan Note (Signed)
Recent report indicates progressive hazy nodular consolidation in the left midlung compared to previous study. Will obtain a noncontrasted chest CT to hopefully better define this process. If we eventually need to repeat a contrasted study, would first need to followup renal function.

## 2014-01-09 NOTE — Patient Instructions (Signed)
Your physician recommends that you schedule a follow-up appointment in: 3 weeks Please have blood work done just before visit (BMET)   Your physician has recommended you make the following change in your medication:    STOP Zocor    Call us back and let us known how you are feeling off medication  STOP Glucophage  Your physician has requested that you have a lower extremity arterial duplex. This test is an ultrasound of the arteries in the legs or arms. It looks at arterial blood flow in the legs and arms. Allow one hour for Lower and Upper Arterial scans. There are no restrictions or special instructions  Please schedule a Non-Contrast chest CT scan     Thank you for choosing [facility] !

## 2014-01-09 NOTE — Assessment & Plan Note (Signed)
Blood pressure control looks good today. 

## 2014-01-12 ENCOUNTER — Telehealth: Payer: Self-pay

## 2014-01-12 ENCOUNTER — Other Ambulatory Visit: Payer: Self-pay | Admitting: Cardiology

## 2014-01-12 ENCOUNTER — Ambulatory Visit (HOSPITAL_COMMUNITY)
Admission: RE | Admit: 2014-01-12 | Discharge: 2014-01-12 | Disposition: A | Discharge status: Home / Self Care | Payer: Medicare Other | Source: Ambulatory Visit | Attending: Cardiology | Admitting: Cardiology

## 2014-01-12 ENCOUNTER — Other Ambulatory Visit (HOSPITAL_COMMUNITY): Payer: Medicare Other

## 2014-01-12 DIAGNOSIS — I2510 Atherosclerotic heart disease of native coronary artery without angina pectoris: Secondary | ICD-10-CM

## 2014-01-12 DIAGNOSIS — J438 Other emphysema: Secondary | ICD-10-CM | POA: Diagnosis not present

## 2014-01-12 DIAGNOSIS — I779 Disorder of arteries and arterioles, unspecified: Secondary | ICD-10-CM

## 2014-01-12 DIAGNOSIS — R9389 Abnormal findings on diagnostic imaging of other specified body structures: Secondary | ICD-10-CM

## 2014-01-12 DIAGNOSIS — R918 Other nonspecific abnormal finding of lung field: Secondary | ICD-10-CM | POA: Diagnosis present

## 2014-01-12 DIAGNOSIS — J984 Other disorders of lung: Secondary | ICD-10-CM | POA: Insufficient documentation

## 2014-01-12 LAB — BASIC METABOLIC PANEL
BUN: 23 mg/dL (ref 6–23)
CALCIUM: 8.9 mg/dL (ref 8.4–10.5)
CHLORIDE: 105 meq/L (ref 96–112)
CO2: 27 meq/L (ref 19–32)
CREATININE: 1.67 mg/dL — AB (ref 0.50–1.35)
Glucose, Bld: 117 mg/dL — ABNORMAL HIGH (ref 70–99)
Potassium: 4.7 mEq/L (ref 3.5–5.3)
Sodium: 141 mEq/L (ref 135–145)

## 2014-01-12 NOTE — Telephone Encounter (Signed)
Message copied by Bernita Raisin on Fri Jan 12, 2014  1:27 PM ------      Message from: Satira Sark      Created: Fri Jan 12, 2014  1:08 PM       Reviewed report. Please let patient/daughter know that the abnormal area seen on chest x-ray does not appear to be a mass, possibly an area of infection or inflammation. Not entirely clear that this is a recent infection however, or needs antibiotics now. I would recommend that he follow up with Dr. Nevada Crane, please send a copy of the chest CT report to Dr. Nevada Crane. ------

## 2014-02-05 ENCOUNTER — Encounter: Payer: Self-pay | Admitting: Cardiology

## 2014-02-05 ENCOUNTER — Encounter: Payer: Medicare Other | Admitting: Cardiology

## 2014-02-05 NOTE — Progress Notes (Signed)
Patient canceled.  This encounter was created in error - please disregard. 

## 2014-02-28 ENCOUNTER — Encounter: Payer: Medicare Other | Admitting: Cardiology

## 2014-02-28 ENCOUNTER — Encounter: Payer: Self-pay | Admitting: Cardiology

## 2014-02-28 NOTE — Progress Notes (Signed)
Patient canceled visit. This encounter was created in error - please disregard.

## 2014-03-21 ENCOUNTER — Ambulatory Visit (INDEPENDENT_AMBULATORY_CARE_PROVIDER_SITE_OTHER): Payer: Medicare Other | Admitting: Cardiology

## 2014-03-21 ENCOUNTER — Encounter: Payer: Self-pay | Admitting: Cardiology

## 2014-03-21 VITALS — BP 142/58 | HR 86 | Ht 66.0 in | Wt 144.0 lb

## 2014-03-21 DIAGNOSIS — E785 Hyperlipidemia, unspecified: Secondary | ICD-10-CM

## 2014-03-21 DIAGNOSIS — I2510 Atherosclerotic heart disease of native coronary artery without angina pectoris: Secondary | ICD-10-CM

## 2014-03-21 DIAGNOSIS — I10 Essential (primary) hypertension: Secondary | ICD-10-CM

## 2014-03-21 NOTE — Assessment & Plan Note (Signed)
No active angina symptoms on medical therapy. Ischemic workup from 2013 was low risk. Continue observation.

## 2014-03-21 NOTE — Progress Notes (Signed)
Reason for visit: CAD, hypertension, hyperlipidemia  Clinical Summary [name] [name] is an 80 y.o.male that I met for the first time back in August, a former patient of Dr. Lattie [name]. At that time I asked him to stop Zocor to see if this led to any improvement in leg discomfort. Lower extremity arterial studies had also been obtained which did not demonstrate any significant obstructive disease, right ABI 1.2, and left ABI 1.1. He states that he thinks holding Zocor may have helped, but it looks like it is still on his list. His daughter was not here today to confirm his medications.  Noncontrasted chest CT done in follow-up of an abnormal chest x-ray showed a left lower lobe airspace opacity representing either infectious or inflammatory process, neoplasm was considered very unlikely. Also mild emphysema and left lower lobe scarring noted.  Follow-up BMET on August 21 showed potassium 4.7, BUN 23, and creatinine 1.6 which was down from 2.1 following medication adjustments.  Lexiscan Cardiolite in April 2013 showed no large ischemic zones with LVEF 66%. Continues to deny any angina symptoms.   No Known Allergies  Current Outpatient Prescriptions  Medication Sig Dispense Refill  . Albuterol Sulfate (PROAIR HFA IN) Inhale into the lungs as needed.        Marland Kitchen aspirin 81 MG tablet Take 81 mg by mouth daily.      Marland Kitchen LORazepam (ATIVAN) 1 MG tablet Take 1 mg by mouth at bedtime.        . Magnesium Oxide (MAG-200 PO) Take by mouth.      . quinapril (ACCUPRIL) 20 MG tablet take 1 tablet by mouth once daily  30 tablet  9  . Tamsulosin HCl (FLOMAX) 0.4 MG CAPS 0.4 mg daily.       . metFORMIN (GLUCOPHAGE) 500 MG tablet       . simvastatin (ZOCOR) 40 MG tablet        No current facility-administered medications for this visit.    Past Medical History  Diagnosis Date  . Diabetes mellitus, type 2   . Essential hypertension, benign   . Coronary atherosclerosis of native coronary artery 1992   Multivessel s/p CABG 1992  . Hyperlipidemia   . TIA (transient ischemic attack)   . History of pneumonia   . Benign prostatic hypertrophy   . Chronic anemia     Past Surgical History  Procedure Laterality Date  . Coronary artery bypass graft  1992  . Tonsillectomy    . Hernia repair    . Cataract extraction Bilateral     Social History [name] [name] reports that he quit smoking about 19 years ago. His smoking use included Cigarettes. He smoked 0.00 packs per day for 40 years. He does not have any smokeless tobacco history on file. [name] [name] reports that he does not drink alcohol.  Review of Systems Complete review of systems negative except as otherwise outlined in the clinical summary and also the following. NYHA class II dyspnea, no palpitations, no falls.  Physical Examination Filed Vitals:   03/21/14 1428  BP: 142/58  Pulse: 86   Filed Weights   03/21/14 1428  Weight: 144 lb (65.318 kg)    Elderly male, appears comfortable at rest.  HEENT: Conjunctiva and lids normal, oropharynx clear.  Neck: Supple, no elevated JVP or carotid bruits, no thyromegaly.  Lungs: Coarse breath sounds, decreased, nonlabored breathing at rest.  Cardiac: Regular rate and rhythm, no S3, soft systolic murmur, no pericardial rub.  Abdomen: Soft, nontender,  bowel sounds present, no guarding or rebound.  Extremities: No pitting edema, distal pulses 1+.  Skin: Warm and dry.  Musculoskeletal: Mild kyphosis.  Neuropsychiatric: Alert and oriented x3, affect grossly appropriate.   Problem List and Plan   Coronary atherosclerosis of native coronary artery No active angina symptoms on medical therapy. Ischemic workup from 2013 was low risk. Continue observation.  Essential hypertension, benign No change to current regimen.  Hyperlipidemia We will check with the patient's daughter regarding his medication list and whether or not he is still on Zocor.    Satira Sark, M.D., F.A.C.C.

## 2014-03-21 NOTE — Patient Instructions (Signed)
Your physician recommends that you schedule a follow-up appointment in: 3 months     Your physician recommends that you continue on your current medications as directed. Please refer to the Current Medication list given to you today.     Please have your daughter call us with your current medication list   [phone]      Thank you for choosing [facility] !

## 2014-03-21 NOTE — Assessment & Plan Note (Signed)
We will check with the patient's daughter regarding his medication list and whether or not he is still on Zocor.

## 2014-03-21 NOTE — Assessment & Plan Note (Signed)
No change to current regimen. 

## 2014-06-25 ENCOUNTER — Encounter: Payer: Self-pay | Admitting: Cardiology

## 2014-06-25 ENCOUNTER — Ambulatory Visit (INDEPENDENT_AMBULATORY_CARE_PROVIDER_SITE_OTHER): Payer: Medicare Other | Admitting: Cardiology

## 2014-06-25 VITALS — BP 142/82 | HR 88 | Ht 68.0 in | Wt 144.6 lb

## 2014-06-25 DIAGNOSIS — I10 Essential (primary) hypertension: Secondary | ICD-10-CM

## 2014-06-25 DIAGNOSIS — I2510 Atherosclerotic heart disease of native coronary artery without angina pectoris: Secondary | ICD-10-CM

## 2014-06-25 DIAGNOSIS — E785 Hyperlipidemia, unspecified: Secondary | ICD-10-CM

## 2014-06-25 NOTE — Patient Instructions (Signed)
Your physician wants you to follow-up in: 6 months You will receive a reminder letter in the mail two months in advance. If you don't receive a letter, please call our office to schedule the follow-up appointment.     Your physician recommends that you continue on your current medications as directed. Please refer to the Current Medication list given to you today.      Thank you for choosing [facility] !        

## 2014-06-25 NOTE — Assessment & Plan Note (Signed)
No change in current antihypertensive regimen. Keep follow-up with Dr. Nevada Crane.

## 2014-06-25 NOTE — Progress Notes (Signed)
Reason for visit: CAD, hyperlipidemia  Clinical Summary [name] is an 81 y.o.male last seen in October 2015. He comes in for a routine visit. Still follows with Dr. Nevada Crane for primary care. From a cardiac perspective, he denies any angina symptoms or nitroglycerin use. States he remains functional with his ADLs, has been having some memory problems however. He reports taking his medicines regularly.  ECG today shows normal sinus rhythm.  Lexiscan Cardiolite in April 2013 showed no large ischemic zones with LVEF 66%.   No Known Allergies  Current Outpatient Prescriptions  Medication Sig Dispense Refill  . Albuterol Sulfate (PROAIR HFA IN) Inhale into the lungs as needed.      Marland Kitchen aspirin 81 MG tablet Take 81 mg by mouth daily.    Marland Kitchen LORazepam (ATIVAN) 1 MG tablet Take 1 mg by mouth at bedtime.      . Magnesium Oxide (MAG-200 PO) Take by mouth.    . metFORMIN (GLUCOPHAGE) 500 MG tablet     . quinapril (ACCUPRIL) 20 MG tablet take 1 tablet by mouth once daily 30 tablet 9  . simvastatin (ZOCOR) 40 MG tablet     . Tamsulosin HCl (FLOMAX) 0.4 MG CAPS 0.4 mg daily.      No current facility-administered medications for this visit.    Past Medical History  Diagnosis Date  . Diabetes mellitus, type 2   . Essential hypertension, benign   . Coronary atherosclerosis of native coronary artery 1992    Multivessel s/p CABG 1992  . Hyperlipidemia   . TIA (transient ischemic attack)   . History of pneumonia   . Benign prostatic hypertrophy   . Chronic anemia     Past Surgical History  Procedure Laterality Date  . Coronary artery bypass graft  1992  . Tonsillectomy    . Hernia repair    . Cataract extraction Bilateral     Family History  Problem Relation Age of Onset  . Cancer Father   . Cancer Mother     Social History Mr. Osment reports that he quit smoking about 20 years ago. His smoking use included Cigarettes. He started smoking about 75 years ago. He quit after 40 years of use.  He does not have any smokeless tobacco history on file. Mr. Soler reports that he does not drink alcohol.  Review of Systems Complete review of systems negative except as otherwise outlined in the clinical summary and also the following. States that he hit his left hand on something while working outdoors, has bruising and some swelling but normal range of motion, no abrasions.  Physical Examination Filed Vitals:   06/25/14 1004  BP: 142/82  Pulse: 88    Wt Readings from Last 3 Encounters:  06/25/14 144 lb 9.6 oz (65.59 kg)  03/21/14 144 lb (65.318 kg)  01/09/14 148 lb (67.132 kg)   Elderly male, appears comfortable at rest.  HEENT: Conjunctiva and lids normal, oropharynx clear.  Neck: Supple, no elevated JVP or carotid bruits, no thyromegaly.  Lungs: Coarse breath sounds, decreased, nonlabored breathing at rest.  Cardiac: Regular rate and rhythm, no S3, soft systolic murmur, no pericardial rub.  Abdomen: Soft, nontender, bowel sounds present, no guarding or rebound.  Extremities: No pitting edema, distal pulses 1+. Swelling of left hand and digits, normal range of motion, ecchymosis consistent with recent reported injury, no abrasions. Normal radial pulse on that side. Skin: Warm and dry.  Musculoskeletal: Mild kyphosis.  Neuropsychiatric: Alert and oriented x3, affect grossly appropriate.  Problem List and Plan   Coronary atherosclerosis of native coronary artery Symptomatically stable on medical therapy, negative ischemic workup within the last 3 years. Continue aspirin, ACE inhibitor, and Zocor. ECG today is normal. Follow-up in 6 months.   Essential hypertension, benign No change in current antihypertensive regimen. Keep follow-up with Dr. Nevada Crane.   Hyperlipidemia Continues on Zocor, follows with Dr. Nevada Crane for lab work.     Satira Sark, M.D., F.A.C.C.

## 2014-06-25 NOTE — Assessment & Plan Note (Signed)
Continues on Zocor, follows with Dr. Nevada Crane for lab work.

## 2014-06-25 NOTE — Assessment & Plan Note (Signed)
Symptomatically stable on medical therapy, negative ischemic workup within the last 3 years. Continue aspirin, ACE inhibitor, and Zocor. ECG today is normal. Follow-up in 6 months.

## 2014-06-26 NOTE — Addendum Note (Signed)
Addended by: Levonne Hubert on: 06/26/2014 11:29 AM   Modules accepted: Orders

## 2014-11-28 ENCOUNTER — Other Ambulatory Visit: Payer: Self-pay | Admitting: Adult Health

## 2015-01-11 ENCOUNTER — Ambulatory Visit: Payer: Medicare Other | Admitting: Cardiology

## 2015-01-29 ENCOUNTER — Ambulatory Visit: Payer: Medicare Other | Admitting: Cardiology

## 2015-02-20 ENCOUNTER — Ambulatory Visit (INDEPENDENT_AMBULATORY_CARE_PROVIDER_SITE_OTHER): Payer: Medicare Other | Admitting: Cardiology

## 2015-02-20 ENCOUNTER — Encounter: Payer: Self-pay | Admitting: Cardiology

## 2015-02-20 VITALS — BP 118/60 | HR 70 | Ht 65.0 in | Wt 142.0 lb

## 2015-02-20 DIAGNOSIS — E785 Hyperlipidemia, unspecified: Secondary | ICD-10-CM

## 2015-02-20 DIAGNOSIS — I10 Essential (primary) hypertension: Secondary | ICD-10-CM

## 2015-02-20 DIAGNOSIS — I208 Other forms of angina pectoris: Secondary | ICD-10-CM

## 2015-02-20 DIAGNOSIS — I25119 Atherosclerotic heart disease of native coronary artery with unspecified angina pectoris: Secondary | ICD-10-CM | POA: Diagnosis not present

## 2015-02-20 NOTE — Progress Notes (Signed)
Cardiology Office Note  Date: 02/20/2015   ID: [name], DOB 11-Jun-1930, MRN [id number]  PCP: [name] Neighbors, MD  Primary Cardiologist: [name] Lesches, MD   Chief Complaint  Patient presents with  . Coronary Artery Disease    History of Present Illness: [name] [name] is an 81 y.o. male last seen in February. He presents today with his daughter for a follow-up visit. Over the last several months he has been experiencing progressive exertional angina, fatigue, and dyspnea on exertion. He has increased use of nitroglycerin as well. He still tries to do his household and outdoor chores, but has been limited, and has to take several breaks. He also reports a generalized weakness in his legs.  Last ischemic evaluation was in 2013 as outlined below, low risk. He reports compliance with his medications which are outlined below. Blood pressure and heart rate look adequately controlled today.  He continues to follow with [name] [name], reportedly had lab work done earlier in the year.   Past Medical History  Diagnosis Date  . Diabetes mellitus, type 2   . Essential hypertension, benign   . Coronary atherosclerosis of native coronary artery 1992    Multivessel s/p CABG 1992  . Hyperlipidemia   . TIA (transient ischemic attack)   . History of pneumonia   . Benign prostatic hypertrophy   . Chronic anemia     Past Surgical History  Procedure Laterality Date  . Coronary artery bypass graft  1992  . Tonsillectomy    . Hernia repair    . Cataract extraction Bilateral     Current Outpatient Prescriptions  Medication Sig Dispense Refill  . Albuterol Sulfate (PROAIR HFA IN) Inhale into the lungs as needed.      Marland Kitchen aspirin 81 MG tablet Take 81 mg by mouth daily.    . clonazePAM (KLONOPIN) 0.5 MG tablet   0  . Magnesium Oxide (MAG-200 PO) Take by mouth.    . metFORMIN (GLUCOPHAGE) 500 MG tablet     . Tamsulosin HCl (FLOMAX) 0.4 MG CAPS 0.4 mg daily.     . Vitamin D, Ergocalciferol,  (DRISDOL) 50000 UNITS CAPS capsule Take 50,000 Units by mouth every 7 (seven) days.    . quinapril (ACCUPRIL) 20 MG tablet take 1 tablet by mouth once daily 30 tablet 11   No current facility-administered medications for this visit.    Allergies:  Review of patient's allergies indicates no known allergies.   Social History: The patient  reports that he quit smoking about 20 years ago. His smoking use included Cigarettes. He started smoking about 75 years ago. He quit after 40 years of use. He does not have any smokeless tobacco history on file. He reports that he does not drink alcohol or use illicit drugs.   ROS:  Please see the history of present illness. Otherwise, complete review of systems is positive for decreased hearing, insomnia.  All other systems are reviewed and negative.   Physical Exam: VS:  BP 118/60 mmHg  Pulse 70  Ht 5\' 5"  (1.651 m)  Wt 142 lb (64.411 kg)  BMI 23.63 kg/m2  SpO2 97%, BMI Body mass index is 23.63 kg/(m^2).  Wt Readings from Last 3 Encounters:  02/20/15 142 lb (64.411 kg)  06/25/14 144 lb 9.6 oz (65.59 kg)  03/21/14 144 lb (65.318 kg)     General: Somewhat frail-appearing, comfortable at rest. HEENT: Conjunctiva and lids normal, oropharynx clear. Neck: Supple, mildly elevated JVP or carotid bruits, no thyromegaly.  Lungs: Clear to auscultation, nonlabored breathing at rest. Cardiac: Regular rate and rhythm, soft systolic murmur, no pericardial rub. Abdomen: Soft, nontender, bowel sounds present, no guarding or rebound. Extremities: No pitting edema, distal pulses 2+. Skin: Warm and dry. Musculoskeletal: Mild kyphosis. Neuropsychiatric: Alert and oriented x3, affect grossly appropriate.   ECG: ECG is not ordered today.  Other Studies Reviewed Today:  Lexiscan Cardiolite in April 2013 showed no large ischemic zones with LVEF 66%. Study Conclusions  Echocardiogram 09/02/2011: - Left ventricle: The cavity size was normal. Wall thickness was  increased in a pattern of mild LVH. Systolic function was normal. The estimated ejection fraction was in the range of 55% to 60%. Possible hypokinesis of the basalinferoseptal myocardium. Doppler parameters are consistent with abnormal left ventricular relaxation (grade 1 diastolic dysfunction). - Aortic valve: Mildly calcified annulus. Trileaflet. Cusp separation was normal. Mild regurgitation. - Mitral valve: Mild regurgitation. - Left atrium: The atrium was mildly dilated. - Tricuspid valve: Mild-moderate regurgitation. - Pulmonary arteries: Systolic pressure was mildly increased. PA peak pressure: 68mm Hg (S). - Pericardium, extracardiac: There was no pericardial effusion.   ASSESSMENT AND PLAN:  1. Symptoms concerning for progressive angina over the last several months. He has a history of multivessel CAD status post CABG in 1992. Last ischemic evaluation was in 2013. Plan is to proceed with both echocardiography and a Lexiscan Cardiolite to reassess cardiac structure and function as well as ischemic burden. We will bring him back to the office to review the results.  2. History of hyperlipidemia, previously on Zocor. This was discontinued out of concerns about possible side effects. He has continued to follow with [name] [name] for lab work which will be requested.  3. Type 2 diabetes mellitus, followed by [name] [name].  4. Essential hypertension, no changes made to current regimen.  Current medicines were reviewed at length with the patient today.   Orders Placed This Encounter  Procedures  . NM Myocar Multi W/Spect W/Wall Motion / EF  . Myocardial Perfusion Imaging  . Echocardiogram    Disposition: FU with me in 6 months.   Signed, Satira Sark, MD, [facility] 02/20/2015 10:30 AM    [place] [facility] [address] [place], [place] [address] Phone: [phone]; Fax: [phone]

## 2015-02-20 NOTE — Patient Instructions (Signed)
Your physician recommends that you schedule a follow-up appointment in: [place]  Your physician has requested that you have a lexiscan myoview. For further information please visit [email or web address]. Please follow instruction sheet, as given.  Your physician has requested that you have an echocardiogram. Echocardiography is a painless test that uses sound waves to create images of your heart. It provides your doctor with information about the size and shape of your heart and how well your heart's chambers and valves are working. This procedure takes approximately one hour. There are no restrictions for this procedure.  Your physician recommends that you continue on your current medications as directed. Please refer to the Current Medication list given to you today.  WE WILL REQUEST LAB WORK FROM DR. HALL'S OFFICE  Thanks for choosing [facility]!!!

## 2015-02-21 ENCOUNTER — Other Ambulatory Visit: Payer: Self-pay

## 2015-02-21 ENCOUNTER — Telehealth: Payer: Self-pay

## 2015-02-21 DIAGNOSIS — E785 Hyperlipidemia, unspecified: Secondary | ICD-10-CM

## 2015-02-21 DIAGNOSIS — R7309 Other abnormal glucose: Secondary | ICD-10-CM

## 2015-02-21 DIAGNOSIS — Z79899 Other long term (current) drug therapy: Secondary | ICD-10-CM

## 2015-02-21 NOTE — Telephone Encounter (Signed)
Called PT, spoke to his daughter and let her know Dr. Juel Burrow office did not have any recent lab work on record to send [place] (Jan 2015) so Dr. Domenic Polite wanted PT to have labs done. CBC,CMET,A1C & FLP. Put orders in, sent lab slips to PT home address. Daughter states that PT has had lab work recently, although Dr. Juel Burrow office doesn't have it on record- due to the glitch they had in their system a few months ago. Pt will have lab work done this week.

## 2015-02-22 ENCOUNTER — Observation Stay (HOSPITAL_COMMUNITY)
Admission: EM | Admit: 2015-02-22 | Discharge: 2015-02-25 | Disposition: A | Discharge status: Home / Self Care | Payer: Medicare Other | Attending: Internal Medicine | Admitting: Internal Medicine

## 2015-02-22 ENCOUNTER — Emergency Department (HOSPITAL_COMMUNITY): Payer: Medicare Other

## 2015-02-22 ENCOUNTER — Telehealth: Payer: Self-pay | Admitting: Cardiology

## 2015-02-22 ENCOUNTER — Encounter (HOSPITAL_COMMUNITY): Payer: Self-pay | Admitting: Emergency Medicine

## 2015-02-22 DIAGNOSIS — E119 Type 2 diabetes mellitus without complications: Secondary | ICD-10-CM | POA: Insufficient documentation

## 2015-02-22 DIAGNOSIS — Z809 Family history of malignant neoplasm, unspecified: Secondary | ICD-10-CM | POA: Insufficient documentation

## 2015-02-22 DIAGNOSIS — Z7982 Long term (current) use of aspirin: Secondary | ICD-10-CM | POA: Insufficient documentation

## 2015-02-22 DIAGNOSIS — I10 Essential (primary) hypertension: Secondary | ICD-10-CM | POA: Insufficient documentation

## 2015-02-22 DIAGNOSIS — D649 Anemia, unspecified: Secondary | ICD-10-CM | POA: Diagnosis present

## 2015-02-22 DIAGNOSIS — Z87891 Personal history of nicotine dependence: Secondary | ICD-10-CM | POA: Insufficient documentation

## 2015-02-22 DIAGNOSIS — Z951 Presence of aortocoronary bypass graft: Secondary | ICD-10-CM | POA: Insufficient documentation

## 2015-02-22 DIAGNOSIS — K2950 Unspecified chronic gastritis without bleeding: Secondary | ICD-10-CM | POA: Insufficient documentation

## 2015-02-22 DIAGNOSIS — R195 Other fecal abnormalities: Secondary | ICD-10-CM | POA: Diagnosis present

## 2015-02-22 DIAGNOSIS — R531 Weakness: Secondary | ICD-10-CM | POA: Insufficient documentation

## 2015-02-22 DIAGNOSIS — K2980 Duodenitis without bleeding: Secondary | ICD-10-CM | POA: Diagnosis not present

## 2015-02-22 DIAGNOSIS — R0789 Other chest pain: Secondary | ICD-10-CM | POA: Diagnosis not present

## 2015-02-22 DIAGNOSIS — K644 Residual hemorrhoidal skin tags: Secondary | ICD-10-CM | POA: Diagnosis not present

## 2015-02-22 DIAGNOSIS — R0609 Other forms of dyspnea: Secondary | ICD-10-CM | POA: Insufficient documentation

## 2015-02-22 DIAGNOSIS — N183 Chronic kidney disease, stage 3 unspecified: Secondary | ICD-10-CM | POA: Insufficient documentation

## 2015-02-22 DIAGNOSIS — D509 Iron deficiency anemia, unspecified: Principal | ICD-10-CM | POA: Insufficient documentation

## 2015-02-22 DIAGNOSIS — E785 Hyperlipidemia, unspecified: Secondary | ICD-10-CM | POA: Insufficient documentation

## 2015-02-22 DIAGNOSIS — K5730 Diverticulosis of large intestine without perforation or abscess without bleeding: Secondary | ICD-10-CM | POA: Diagnosis not present

## 2015-02-22 DIAGNOSIS — Q438 Other specified congenital malformations of intestine: Secondary | ICD-10-CM | POA: Insufficient documentation

## 2015-02-22 DIAGNOSIS — Z79899 Other long term (current) drug therapy: Secondary | ICD-10-CM | POA: Diagnosis not present

## 2015-02-22 DIAGNOSIS — K648 Other hemorrhoids: Secondary | ICD-10-CM | POA: Insufficient documentation

## 2015-02-22 LAB — COMPREHENSIVE METABOLIC PANEL
ALBUMIN: 4 g/dL (ref 3.5–5.0)
ALT: 16 U/L — ABNORMAL LOW (ref 17–63)
ANION GAP: 5 (ref 5–15)
AST: 13 U/L — AB (ref 15–41)
Alkaline Phosphatase: 71 U/L (ref 38–126)
BUN: 21 mg/dL — ABNORMAL HIGH (ref 6–20)
CALCIUM: 9.2 mg/dL (ref 8.9–10.3)
CO2: 28 mmol/L (ref 22–32)
Chloride: 104 mmol/L (ref 101–111)
Creatinine, Ser: 1.57 mg/dL — ABNORMAL HIGH (ref 0.61–1.24)
GFR calc non Af Amer: 39 mL/min — ABNORMAL LOW (ref >60)
GFR, EST AFRICAN AMERICAN: 46 mL/min — AB (ref >60)
Glucose, Bld: 111 mg/dL — ABNORMAL HIGH (ref 65–99)
Potassium: 4.6 mmol/L (ref 3.5–5.1)
SODIUM: 137 mmol/L (ref 135–145)
Total Bilirubin: 0.4 mg/dL (ref 0.3–1.2)
Total Protein: 7.6 g/dL (ref 6.5–8.1)

## 2015-02-22 LAB — CBC WITH DIFFERENTIAL/PLATELET
BASOS ABS: 0 10*3/uL (ref 0.0–0.1)
BASOS PCT: 0 %
Eosinophils Absolute: 0.4 10*3/uL (ref 0.0–0.7)
Eosinophils Relative: 5 %
HEMATOCRIT: 23.4 % — AB (ref 39.0–52.0)
Hemoglobin: 7.5 g/dL — ABNORMAL LOW (ref 13.0–17.0)
Lymphocytes Relative: 19 %
Lymphs Abs: 1.4 10*3/uL (ref 0.7–4.0)
MCH: 31.9 pg (ref 26.0–34.0)
MCHC: 32.1 g/dL (ref 30.0–36.0)
MCV: 99.6 fL (ref 78.0–100.0)
Monocytes Absolute: 0.3 10*3/uL (ref 0.1–1.0)
Monocytes Relative: 5 %
NEUTROS ABS: 5.1 10*3/uL (ref 1.7–7.7)
Neutrophils Relative %: 71 %
Platelets: 194 10*3/uL (ref 150–400)
RBC: 2.35 MIL/uL — ABNORMAL LOW (ref 4.22–5.81)
RDW: 14 % (ref 11.5–15.5)
WBC: 7.2 10*3/uL (ref 4.0–10.5)

## 2015-02-22 LAB — TROPONIN I

## 2015-02-22 LAB — PREPARE RBC (CROSSMATCH)

## 2015-02-22 LAB — ABO/RH: ABO/RH(D): O POS

## 2015-02-22 LAB — I-STAT CG4 LACTIC ACID, ED: LACTIC ACID, VENOUS: 1.48 mmol/L (ref 0.5–2.0)

## 2015-02-22 LAB — BRAIN NATRIURETIC PEPTIDE: B NATRIURETIC PEPTIDE 5: 84 pg/mL (ref 0.0–100.0)

## 2015-02-22 MED ORDER — QUINAPRIL HCL 10 MG PO TABS
20.0000 mg | ORAL_TABLET | Freq: Every day | ORAL | Status: DC
  Filled 2015-02-22 (×3): qty 2

## 2015-02-22 MED ORDER — ALBUTEROL SULFATE (2.5 MG/3ML) 0.083% IN NEBU
5.0000 mg | INHALATION_SOLUTION | Freq: Once | RESPIRATORY_TRACT | Status: AC
  Administered 2015-02-22: 5 mg via RESPIRATORY_TRACT
  Filled 2015-02-22: qty 6

## 2015-02-22 MED ORDER — ASPIRIN 81 MG PO CHEW
81.0000 mg | CHEWABLE_TABLET | Freq: Every day | ORAL | Status: DC
  Administered 2015-02-23: 81 mg via ORAL
  Filled 2015-02-22 (×2): qty 1

## 2015-02-22 MED ORDER — ACETAMINOPHEN 325 MG PO TABS: 650.0000 mg | ORAL_TABLET | Freq: Four times a day (QID) | ORAL | Status: DC | PRN

## 2015-02-22 MED ORDER — CLONAZEPAM 0.5 MG PO TABS
1.0000 mg | ORAL_TABLET | Freq: Every day | ORAL | Status: DC
  Administered 2015-02-22 – 2015-02-24 (×3): 1 mg via ORAL
  Filled 2015-02-22 (×3): qty 2

## 2015-02-22 MED ORDER — ENSURE ENLIVE PO LIQD
237.0000 mL | Freq: Two times a day (BID) | ORAL | Status: DC
  Administered 2015-02-23 – 2015-02-24 (×2): 237 mL via ORAL

## 2015-02-22 MED ORDER — TAMSULOSIN HCL 0.4 MG PO CAPS
0.4000 mg | ORAL_CAPSULE | Freq: Every day | ORAL | Status: DC
  Administered 2015-02-23 – 2015-02-25 (×3): 0.4 mg via ORAL
  Filled 2015-02-22 (×4): qty 1

## 2015-02-22 MED ORDER — ALBUTEROL SULFATE (2.5 MG/3ML) 0.083% IN NEBU: 3.0000 mL | INHALATION_SOLUTION | RESPIRATORY_TRACT | Status: DC | PRN

## 2015-02-22 MED ORDER — ONDANSETRON HCL 4 MG PO TABS
4.0000 mg | ORAL_TABLET | Freq: Four times a day (QID) | ORAL | Status: DC | PRN
Start: 2015-02-22 — End: 2015-02-25

## 2015-02-22 MED ORDER — VITAMIN B-12 1000 MCG PO TABS
1000.0000 ug | ORAL_TABLET | Freq: Every day | ORAL | Status: DC
  Administered 2015-02-23 – 2015-02-25 (×3): 1000 ug via ORAL
  Filled 2015-02-22 (×4): qty 1

## 2015-02-22 MED ORDER — ONDANSETRON HCL 4 MG/2ML IJ SOLN: 4.0000 mg | Freq: Four times a day (QID) | INTRAMUSCULAR | Status: DC | PRN

## 2015-02-22 MED ORDER — SODIUM CHLORIDE 0.9 % IV SOLN
10.0000 mL/h | Freq: Once | INTRAVENOUS | Status: AC
  Administered 2015-02-22: 10 mL/h via INTRAVENOUS

## 2015-02-22 MED ORDER — ACETAMINOPHEN 650 MG RE SUPP: 650.0000 mg | Freq: Four times a day (QID) | RECTAL | Status: DC | PRN

## 2015-02-22 MED ORDER — FINASTERIDE 5 MG PO TABS
5.0000 mg | ORAL_TABLET | Freq: Every day | ORAL | Status: DC
  Administered 2015-02-23 – 2015-02-25 (×3): 5 mg via ORAL
  Filled 2015-02-22 (×5): qty 1

## 2015-02-22 MED ORDER — METFORMIN HCL 500 MG PO TABS
500.0000 mg | ORAL_TABLET | Freq: Two times a day (BID) | ORAL | Status: DC
  Administered 2015-02-22 – 2015-02-25 (×5): 500 mg via ORAL
  Filled 2015-02-22 (×6): qty 1

## 2015-02-22 MED ORDER — ALUM & MAG HYDROXIDE-SIMETH 200-200-20 MG/5ML PO SUSP
30.0000 mL | Freq: Four times a day (QID) | ORAL | Status: DC | PRN
Start: 2015-02-22 — End: 2015-02-25

## 2015-02-22 NOTE — Telephone Encounter (Signed)
[name] daughter Helene Kelp called stating that her father is very short of breath and very weak. Called the [facility] and was transferred To [place] office requesting to let Dr. Domenic Polite about his new symptoms. Daughter is wanting to know if he needs to be admitted To the hospital and have his test done earlier than Wednesday.

## 2015-02-22 NOTE — ED Notes (Signed)
RT at bedside.

## 2015-02-22 NOTE — ED Provider Notes (Signed)
CSN: [id number]     Arrival date & time 02/22/15  1231 History   First MD Initiated Contact with Patient 02/22/15 1405     Chief Complaint  Patient presents with  . Shortness of Breath     (Consider location/radiation/quality/duration/timing/severity/associated sxs/prior Treatment) HPI Patient presents with concern of dyspnea, chest pressure. Symptoms began about one week ago, so they have been mildly present previously. With this with the patient has had persistent dyspnea, both at rest, but also with exertion. There is associated chest pressure, with exertion. No new syncope, falling or There is associated generalized weakness. Patient is scheduled for a care medicine stress test next week. The patient spoke with his cardiologist today, and given the persistency of symptoms, was referred here for evaluation. He denies confusion, disorientation, family members corroborate all details of the history of present illness.  Past Medical History  Diagnosis Date  . Diabetes mellitus, type 2   . Essential hypertension, benign   . Coronary atherosclerosis of native coronary artery 1992    Multivessel s/p CABG 1992  . Hyperlipidemia   . TIA (transient ischemic attack)   . History of pneumonia   . Benign prostatic hypertrophy   . Chronic anemia    Past Surgical History  Procedure Laterality Date  . Coronary artery bypass graft  1992  . Tonsillectomy    . Hernia repair    . Cataract extraction Bilateral    Family History  Problem Relation Age of Onset  . Cancer Father   . Cancer Mother    Social History  Substance Use Topics  . Smoking status: Former Smoker -- 40 years    Types: Cigarettes    Start date: 05/26/1939    Quit date: 06/20/1994  . Smokeless tobacco: None  . Alcohol Use: No    Review of Systems  Constitutional:       Per HPI, otherwise negative  HENT:       Per HPI, otherwise negative  Respiratory:       Per HPI, otherwise negative  Cardiovascular:        Per HPI, otherwise negative  Gastrointestinal: Negative for vomiting.  Endocrine:       Negative aside from HPI  Genitourinary:       Neg aside from HPI   Musculoskeletal:       Per HPI, otherwise negative  Skin: Negative.   Neurological: Negative for syncope.      Allergies  Review of patient's allergies indicates no known allergies.  Home Medications   Prior to Admission medications   Medication Sig Start Date End Date Taking? Authorizing Provider  Albuterol Sulfate (PROAIR HFA IN) Inhale into the lungs as needed.      Historical Provider, MD  aspirin 81 MG tablet Take 81 mg by mouth daily.    Historical Provider, MD  clonazePAM Bobbye Charleston) 0.5 MG tablet  01/29/15   Historical Provider, MD  Magnesium Oxide (MAG-200 PO) Take by mouth.    Historical Provider, MD  metFORMIN (GLUCOPHAGE) 500 MG tablet  12/13/13   Historical Provider, MD  quinapril (ACCUPRIL) 20 MG tablet take 1 tablet by mouth once daily 11/28/14   Satira Sark, MD  Tamsulosin HCl (FLOMAX) 0.4 MG CAPS 0.4 mg daily.  07/18/11   Historical Provider, MD  Vitamin D, Ergocalciferol, (DRISDOL) 50000 UNITS CAPS capsule Take 50,000 Units by mouth every 7 (seven) days.    Historical Provider, MD   BP 134/57 mmHg  Pulse 97  Temp(Src) 98.1 F (36.7  C)  Resp 24  Ht 5\' 8"  (1.727 m)  Wt 146 lb (66.225 kg)  BMI 22.20 kg/m2  SpO2 99% Physical Exam  Constitutional: He is oriented to person, place, and time. He has a sickly appearance. No distress.  HENT:  Head: Normocephalic and atraumatic.  Eyes: Conjunctivae and EOM are normal.  Cardiovascular: Normal rate and regular rhythm.   Pulmonary/Chest: Effort normal. No stridor. No respiratory distress. He has decreased breath sounds. He has wheezes.  Abdominal: He exhibits no distension.  Genitourinary: Rectal exam shows no external hemorrhoid and no fissure. Guaiac positive stool.  Musculoskeletal: He exhibits no edema.  Neurological: He is alert and oriented to person,  place, and time.  Skin: Skin is warm and dry.  Psychiatric: He has a normal mood and affect.  Nursing note and vitals reviewed.   ED Course  Procedures (including critical care time) Labs Review Labs Reviewed  TROPONIN I  CBC WITH DIFFERENTIAL/PLATELET  COMPREHENSIVE METABOLIC PANEL  BRAIN NATRIURETIC PEPTIDE  I-STAT CG4 LACTIC ACID, ED    Imaging Review Dg Chest 2 View  02/22/2015   CLINICAL DATA:  One month history shortness of breath and chest pain  EXAM: CHEST  2 VIEW  COMPARISON:  Chest radiograph December 28, 2013; chest CT January 12, 2014  FINDINGS: There is scarring in the left lower lobe region. There is no new opacity in the lungs. No frank edema or consolidation. The heart size and pulmonary vascularity are normal. There is atherosclerotic change in the aorta. Patient is status post coronary artery bypass grafting.  IMPRESSION: Scarring left lower lobe region.  No edema or consolidation.   Electronically Signed   By: Lowella Grip III M.D.   On: 02/22/2015 13:03   I have personally reviewed and evaluated these images and lab results as part of my medical decision-making.   EKG Interpretation   Date/Time:  Friday February 22 2015 12:35:44 EDT Ventricular Rate:  93 PR Interval:  166 QRS Duration: 64 QT Interval:  330 QTC Calculation: 410 R Axis:   27 Text Interpretation:  Normal sinus rhythm Normal ECG Sinus rhythm Artifact  Normal ECG Confirmed by Carmin Muskrat  MD ([id number]) on 02/22/2015 2:06:45  PM     Pulse oximetry 95% room air borderline Cardiac 90 sinus normal  On repeat exam patient is in similar condition, tachycardic, normal blood pressure. Labs reviewed with patient and family members. With concern for anemia, occult GI bleed, there suspicion for symptomatically anemia as etiology for his exertional dyspnea, chest pain. Patient will have transfusion.  MDM  Patient p/w new CP, DOE.  On exam he is awake and alert, though deconditioned in  appearance. No focal neuro deficits. PE notable for positive hemoccult status. Labs notable for anemia (last blood draw in 2012) and elevated Creatinine. Patient received nebs w improved aeration, and Sx.  He also received IVF, then transfusion for his symptomatic anemia.  CRITICAL CARE Performed by: Carmin Muskrat Total critical care time: 35 Critical care time was exclusive of separately billable procedures and treating other patients. Critical care was necessary to treat or prevent imminent or life-threatening deterioration. Critical care was time spent personally by me on the following activities: development of treatment plan with patient and/or surrogate as well as nursing, discussions with consultants, evaluation of patient's response to treatment, examination of patient, obtaining history from patient or surrogate, ordering and performing treatments and interventions, ordering and review of laboratory studies, ordering and review of radiographic studies, pulse oximetry and re-evaluation  of patient's condition.   Carmin Muskrat, MD 02/22/15 [phone]

## 2015-02-22 NOTE — Telephone Encounter (Signed)
Unable to reach daughter,LM.I spoke with patient,he will try to reach her also and will go to ED for evaluation

## 2015-02-22 NOTE — Telephone Encounter (Signed)
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 Concerned. Ms.Lawrence NP has an opening today if you choose

## 2015-02-22 NOTE — H&P (Signed)
History and Physical  [name] WJX:[id number] DOB: 03-Mar-1931 DOA: 02/22/2015  Referring physician: Dr. Vanita Panda, ED physician PCP: [name], MD   Chief Complaint: Weakness, chest pressure and shortness of breath  HPI: [name] [name] is a 81 y.o. male  With a history of coronary artery disease with a history of CABG, type 2 diabetes, essential hypertension, hyperlipidemia, BPH, chronic anemia who presents to the hospital with one month of intermittent dyspnea and chest pressure and weakness. The symptoms are worse with activity and improves with rest. The patient critically notes that his symptoms get worse when he relates to the mailbox which is approximately 500 feet away from his house. His fatigue has been worsening. He contacted his cardiologist, who instructed the patient come to the hospital for evaluation. In the emergency primary, the patient was noted to be severely anemic.   Review of Systems:   Pt complains of weakness.  Pt denies any fevers, chills, nausea, vomiting, diarrhea, constipation, abdominal pain, shortness of breath, dyspnea on exertion, orthopnea, cough, wheezing, palpitations, headache, vision changes, lightheadedness, dizziness, melena, rectal bleeding.  Review of systems are otherwise negative  Past Medical History  Diagnosis Date  . Diabetes mellitus, type 2   . Essential hypertension, benign   . Coronary atherosclerosis of native coronary artery 1992    Multivessel s/p CABG 1992  . Hyperlipidemia   . TIA (transient ischemic attack)   . History of pneumonia   . Benign prostatic hypertrophy   . Chronic anemia    Past Surgical History  Procedure Laterality Date  . Coronary artery bypass graft  1992  . Tonsillectomy    . Hernia repair    . Cataract extraction Bilateral    Social History:  reports that he quit smoking about 20 years ago. His smoking use included Cigarettes. He started smoking about 75 years ago. He quit after 40 years of use. He  does not have any smokeless tobacco history on file. He reports that he does not drink alcohol or use illicit drugs. Patient lives at home & is able to participate in activities of daily living  No Known Allergies  Family History  Problem Relation Age of Onset  . Cancer Father   . Cancer Mother      Prior to Admission medications   Medication Sig Start Date End Date Taking? Authorizing Provider  Albuterol Sulfate (PROAIR HFA IN) Inhale into the lungs as needed.     Yes Historical Provider, MD  aspirin 81 MG tablet Take 81 mg by mouth daily.   Yes Historical Provider, MD  Cholecalciferol (VITAMIN D PO) Take 1 capsule by mouth daily.   Yes Historical Provider, MD  clonazePAM (KLONOPIN) 0.5 MG tablet Take 1 mg by mouth at bedtime.  01/29/15  Yes Historical Provider, MD  finasteride (PROSCAR) 5 MG tablet Take 5 mg by mouth daily.   Yes Historical Provider, MD  Magnesium Oxide (MAG-200 PO) Take 1 tablet by mouth every morning.    Yes Historical Provider, MD  metFORMIN (GLUCOPHAGE) 500 MG tablet Take 500 mg by mouth 2 (two) times daily with a meal.  12/13/13  Yes Historical Provider, MD  Multiple Vitamins-Minerals (ONE-A-DAY 50 PLUS PO) Take 1 tablet by mouth daily.   Yes Historical Provider, MD  quinapril (ACCUPRIL) 20 MG tablet take 1 tablet by mouth once daily 11/28/14  Yes Satira Sark, MD  Tamsulosin HCl (FLOMAX) 0.4 MG CAPS 0.4 mg daily.  07/18/11  Yes Historical Provider, MD  vitamin  B-12 (CYANOCOBALAMIN) 1000 MCG tablet Take 1,000 mcg by mouth daily.   Yes Historical Provider, MD    Physical Exam: BP 149/62 mmHg  Pulse 117  Temp(Src) 98.1 F (36.7 C)  Resp 18  Ht 5\' 8"  (1.727 m)  Wt 66.225 kg (146 lb)  BMI 22.20 kg/m2  SpO2 99%  General: Older Caucasian male. Awake and alert and oriented x3. No acute cardiopulmonary distress.  Eyes: Pupils equal, round, reactive to light. Extraocular muscles are intact. Sclerae anicteric and noninjected.  ENT:  Moist mucosal membranes. No  mucosal lesions. Teeth in moderate repair  Neck: Neck supple without lymphadenopathy. No carotid bruits. No masses palpated.  Cardiovascular: Regular rate with normal S1-S2 sounds. No murmurs, rubs, gallops auscultated. No JVD.  Respiratory: Good respiratory effort with no wheezes, rales, rhonchi. Lungs clear to auscultation bilaterally.  Abdomen: Soft, nontender, nondistended. Active bowel sounds. No masses or hepatosplenomegaly  Skin: Dry, warm to touch. 2+ dorsalis pedis and radial pulses. Musculoskeletal: No calf or leg pain. All major joints not erythematous nontender.  Psychiatric: Intact judgment and insight.  Neurologic: No focal neurological deficits. Cranial nerves II through XII are grossly intact.           Labs on Admission:  Basic Metabolic Panel:  Recent Labs Lab 02/22/15 1413  NA 137  K 4.6  CL 104  CO2 28  GLUCOSE 111*  BUN 21*  CREATININE 1.57*  CALCIUM 9.2   Liver Function Tests:  Recent Labs Lab 02/22/15 1413  AST 13*  ALT 16*  ALKPHOS 71  BILITOT 0.4  PROT 7.6  ALBUMIN 4.0   No results for input(s): LIPASE, AMYLASE in the last 168 hours. No results for input(s): AMMONIA in the last 168 hours. CBC:  Recent Labs Lab 02/22/15 1413  WBC 7.2  NEUTROABS 5.1  HGB 7.5*  HCT 23.4*  MCV 99.6  PLT 194   Cardiac Enzymes:  Recent Labs Lab 02/22/15 1413  TROPONINI <0.03    BNP (last 3 results)  Recent Labs  02/22/15 1446  BNP 84.0    ProBNP (last 3 results) No results for input(s): PROBNP in the last 8760 hours.  CBG: No results for input(s): GLUCAP in the last 168 hours.  Radiological Exams on Admission: Dg Chest 2 View  02/22/2015   CLINICAL DATA:  One month history shortness of breath and chest pain  EXAM: CHEST  2 VIEW  COMPARISON:  Chest radiograph December 28, 2013; chest CT January 12, 2014  FINDINGS: There is scarring in the left lower lobe region. There is no new opacity in the lungs. No frank edema or consolidation. The heart  size and pulmonary vascularity are normal. There is atherosclerotic change in the aorta. Patient is status post coronary artery bypass grafting.  IMPRESSION: Scarring left lower lobe region.  No edema or consolidation.   Electronically Signed   By: Lowella Grip III M.D.   On: 02/22/2015 13:03    EKG: Independently reviewed. Normal sinus rhythm with a ventricular rate of 93. Normal intervals. No ST changes  Assessment/Plan Present on Admission:  . Symptomatic anemia . Fecal occult blood test positive  This patient was discussed with the ED physician, including pertinent vitals, physical exam findings, labs, and imaging.  We also discussed care given by the ED provider.  #1 symptomatically anemia  Admit for observation  Transfuse 2 units packed red blood cells  Recheck hemoglobin and hematocrit following transfusion  Premedicate with Tylenol and Benadryl to reduce transfusion reaction  Discussed  risks versus benefits - in particular discussed risk of transmission of HIV, hepatitis B, hepatitis C. Patient amenable for transfusion. #2 fecal occult blood test positive  Patient will likely need a colonoscopy, may not need in the emergency setting   We'll consult GI #3 hypertension  Continue antihypertensives #4 diabetes  Continue metformin  DVT prophylaxis: SCDs as the patient has positive fecal occult blood test  Consultants: Will consult GI as above  Code Status: Full code  Family Communication: None   Disposition Plan: Observation for transfusion - anticipate release tomorrow   Truett Mainland, DO [facility] Pager [phone]

## 2015-02-22 NOTE — ED Notes (Signed)
Pt reports increasing SOB over 1 week. Pt states he has had chest pressure but it is better today, increases with exertion.

## 2015-02-22 NOTE — Telephone Encounter (Signed)
Daughter called back and is taking dad to [facility] ED

## 2015-02-22 NOTE — Telephone Encounter (Signed)
If he is having escalating symptoms to that degree, I would recommend that he go to the hospital for evaluation. Mainly need to exclude active ACS.

## 2015-02-23 DIAGNOSIS — K2980 Duodenitis without bleeding: Secondary | ICD-10-CM | POA: Diagnosis not present

## 2015-02-23 DIAGNOSIS — Z79899 Other long term (current) drug therapy: Secondary | ICD-10-CM | POA: Diagnosis not present

## 2015-02-23 DIAGNOSIS — K2950 Unspecified chronic gastritis without bleeding: Secondary | ICD-10-CM | POA: Diagnosis not present

## 2015-02-23 DIAGNOSIS — D649 Anemia, unspecified: Secondary | ICD-10-CM | POA: Diagnosis not present

## 2015-02-23 DIAGNOSIS — Z809 Family history of malignant neoplasm, unspecified: Secondary | ICD-10-CM | POA: Diagnosis not present

## 2015-02-23 DIAGNOSIS — R0609 Other forms of dyspnea: Secondary | ICD-10-CM | POA: Diagnosis present

## 2015-02-23 DIAGNOSIS — D509 Iron deficiency anemia, unspecified: Secondary | ICD-10-CM | POA: Diagnosis not present

## 2015-02-23 DIAGNOSIS — K648 Other hemorrhoids: Secondary | ICD-10-CM | POA: Diagnosis not present

## 2015-02-23 DIAGNOSIS — R531 Weakness: Secondary | ICD-10-CM | POA: Diagnosis not present

## 2015-02-23 DIAGNOSIS — K644 Residual hemorrhoidal skin tags: Secondary | ICD-10-CM | POA: Diagnosis not present

## 2015-02-23 DIAGNOSIS — K5730 Diverticulosis of large intestine without perforation or abscess without bleeding: Secondary | ICD-10-CM | POA: Diagnosis not present

## 2015-02-23 DIAGNOSIS — I10 Essential (primary) hypertension: Secondary | ICD-10-CM | POA: Diagnosis not present

## 2015-02-23 DIAGNOSIS — R195 Other fecal abnormalities: Secondary | ICD-10-CM | POA: Diagnosis not present

## 2015-02-23 DIAGNOSIS — Z7982 Long term (current) use of aspirin: Secondary | ICD-10-CM | POA: Diagnosis not present

## 2015-02-23 DIAGNOSIS — Z951 Presence of aortocoronary bypass graft: Secondary | ICD-10-CM | POA: Diagnosis not present

## 2015-02-23 DIAGNOSIS — Z87891 Personal history of nicotine dependence: Secondary | ICD-10-CM | POA: Diagnosis not present

## 2015-02-23 DIAGNOSIS — E119 Type 2 diabetes mellitus without complications: Secondary | ICD-10-CM | POA: Diagnosis not present

## 2015-02-23 DIAGNOSIS — R0789 Other chest pain: Secondary | ICD-10-CM | POA: Diagnosis not present

## 2015-02-23 DIAGNOSIS — E785 Hyperlipidemia, unspecified: Secondary | ICD-10-CM | POA: Diagnosis not present

## 2015-02-23 DIAGNOSIS — Q438 Other specified congenital malformations of intestine: Secondary | ICD-10-CM | POA: Diagnosis not present

## 2015-02-23 LAB — CBC
HCT: 29 % — ABNORMAL LOW (ref 39.0–52.0)
Hemoglobin: 9.9 g/dL — ABNORMAL LOW (ref 13.0–17.0)
MCH: 31.8 pg (ref 26.0–34.0)
MCHC: 34.1 g/dL (ref 30.0–36.0)
MCV: 93.2 fL (ref 78.0–100.0)
PLATELETS: 162 10*3/uL (ref 150–400)
RBC: 3.11 MIL/uL — ABNORMAL LOW (ref 4.22–5.81)
RDW: 15.8 % — AB (ref 11.5–15.5)
WBC: 7.2 10*3/uL (ref 4.0–10.5)

## 2015-02-23 LAB — BASIC METABOLIC PANEL
Anion gap: 6 (ref 5–15)
BUN: 18 mg/dL (ref 6–20)
CALCIUM: 8.4 mg/dL — AB (ref 8.9–10.3)
CO2: 25 mmol/L (ref 22–32)
CREATININE: 1.37 mg/dL — AB (ref 0.61–1.24)
Chloride: 107 mmol/L (ref 101–111)
GFR calc non Af Amer: 46 mL/min — ABNORMAL LOW (ref >60)
GFR, EST AFRICAN AMERICAN: 54 mL/min — AB (ref >60)
GLUCOSE: 116 mg/dL — AB (ref 65–99)
Potassium: 4.5 mmol/L (ref 3.5–5.1)
Sodium: 138 mmol/L (ref 135–145)

## 2015-02-23 LAB — FERRITIN: FERRITIN: 22 ng/mL — AB (ref 24–336)

## 2015-02-23 MED ORDER — PEG 3350-KCL-NA BICARB-NACL 420 G PO SOLR
4000.0000 mL | Freq: Once | ORAL | Status: AC
  Administered 2015-02-23: 4000 mL via ORAL
  Filled 2015-02-23: qty 4000

## 2015-02-23 MED ORDER — LISINOPRIL 10 MG PO TABS
20.0000 mg | ORAL_TABLET | Freq: Every day | ORAL | Status: DC
  Administered 2015-02-23 – 2015-02-25 (×3): 20 mg via ORAL
  Filled 2015-02-23 (×4): qty 2

## 2015-02-23 MED ORDER — BISACODYL 5 MG PO TBEC
10.0000 mg | DELAYED_RELEASE_TABLET | ORAL | Status: AC
  Administered 2015-02-23 (×2): 10 mg via ORAL
  Filled 2015-02-23 (×2): qty 2

## 2015-02-23 MED ORDER — PANTOPRAZOLE SODIUM 40 MG PO TBEC
40.0000 mg | DELAYED_RELEASE_TABLET | Freq: Every day | ORAL | Status: DC
  Administered 2015-02-23 – 2015-02-25 (×2): 40 mg via ORAL
  Filled 2015-02-23 (×2): qty 1

## 2015-02-23 MED ORDER — ASPIRIN 81 MG PO CHEW
81.0000 mg | CHEWABLE_TABLET | Freq: Every day | ORAL | Status: DC
  Administered 2015-02-24 – 2015-02-25 (×2): 81 mg via ORAL
  Filled 2015-02-23 (×2): qty 1

## 2015-02-23 NOTE — Progress Notes (Signed)
[facility] PROGRESS NOTE  [name] [name] QVZ:[id number] DOB: 02-Jan-1931    PCP:   [name], MD   HPI: [name] [name] is an 81 y.o. male admitted yesterday with chest tightness, resolved with transfusion from Hb of 7.5 gr to Hb of 10.9 d/dL today.  He has normocytic anemia, but prior ferritin was already low.  GI saw him and planned endoscopy for tomorrow.  He is otherwise feeling well.  Never had colonoscopy before.  His troponin was negative.   Rewiew of Systems:  Constitutional: Negative for malaise, fever and chills. No significant weight loss or weight gain Eyes: Negative for eye pain, redness and discharge, diplopia, visual changes, or flashes of light. ENMT: Negative for ear pain, hoarseness, nasal congestion, sinus pressure and sore throat. No headaches; tinnitus, drooling, or problem swallowing. Cardiovascular: Negative for chest pain, palpitations, diaphoresis, dyspnea and peripheral edema. ; No orthopnea, PND Respiratory: Negative for cough, hemoptysis, wheezing and stridor. No pleuritic chestpain. Gastrointestinal: Negative for nausea, vomiting, diarrhea, constipation, abdominal pain, melena, blood in stool, hematemesis, jaundice and rectal bleeding.    Genitourinary: Negative for frequency, dysuria, incontinence,flank pain and hematuria; Musculoskeletal: Negative for back pain and neck pain. Negative for swelling and trauma.;  Skin: . Negative for pruritus, rash, abrasions, bruising and skin lesion.; ulcerations Neuro: Negative for headache, lightheadedness and neck stiffness. Negative for weakness, altered level of consciousness , altered mental status, extremity weakness, burning feet, involuntary movement, seizure and syncope.  Psych: negative for anxiety, depression, insomnia, tearfulness, panic attacks, hallucinations, paranoia, suicidal or homicidal ideation    Past Medical History  Diagnosis Date  . Diabetes mellitus, type 2   . Essential hypertension, benign   .  Coronary atherosclerosis of native coronary artery 1992    Multivessel s/p CABG 1992  . Hyperlipidemia   . TIA (transient ischemic attack)   . History of pneumonia   . Benign prostatic hypertrophy   . Chronic anemia     Past Surgical History  Procedure Laterality Date  . Coronary artery bypass graft  1992  . Tonsillectomy    . Hernia repair    . Cataract extraction Bilateral     Medications:  HOME MEDS: Prior to Admission medications   Medication Sig Start Date End Date Taking? Authorizing Provider  Albuterol Sulfate (PROAIR HFA IN) Inhale into the lungs as needed.     Yes Historical Provider, MD  aspirin 81 MG tablet Take 81 mg by mouth daily.   Yes Historical Provider, MD  Cholecalciferol (VITAMIN D PO) Take 1 capsule by mouth daily.   Yes Historical Provider, MD  clonazePAM (KLONOPIN) 0.5 MG tablet Take 1 mg by mouth at bedtime.  01/29/15  Yes Historical Provider, MD  finasteride (PROSCAR) 5 MG tablet Take 5 mg by mouth daily.   Yes Historical Provider, MD  Magnesium Oxide (MAG-200 PO) Take 1 tablet by mouth every morning.    Yes Historical Provider, MD  metFORMIN (GLUCOPHAGE) 500 MG tablet Take 500 mg by mouth 2 (two) times daily with a meal.  12/13/13  Yes Historical Provider, MD  Multiple Vitamins-Minerals (ONE-A-DAY 50 PLUS PO) Take 1 tablet by mouth daily.   Yes Historical Provider, MD  quinapril (ACCUPRIL) 20 MG tablet take 1 tablet by mouth once daily 11/28/14  Yes Satira Sark, MD  Tamsulosin HCl (FLOMAX) 0.4 MG CAPS 0.4 mg daily.  07/18/11  Yes Historical Provider, MD  vitamin B-12 (CYANOCOBALAMIN) 1000 MCG tablet Take 1,000 mcg by mouth daily.   Yes Historical  Provider, MD     Allergies:  No Known Allergies  Social History:   reports that he quit smoking about 20 years ago. His smoking use included Cigarettes. He started smoking about 75 years ago. He quit after 40 years of use. He does not have any smokeless tobacco history on file. He reports that he does not  drink alcohol or use illicit drugs.  Family History: Family History  Problem Relation Age of Onset  . Cancer Father   . Cancer Mother      Physical Exam: Filed Vitals:   02/23/15 0320 02/23/15 0355 02/23/15 0729 02/23/15 1421  BP: 123/61 131/60 110/88 166/90  Pulse: 92 89 84 101  Temp: 98.1 F (36.7 C) 98.3 F (36.8 C) 98.1 F (36.7 C) 97.9 F (36.6 C)  TempSrc: Oral Oral Oral Oral  Resp: 20 20 20 18   Height:      Weight:      SpO2:  96% 96% 97%   Blood pressure 166/90, pulse 101, temperature 97.9 F (36.6 C), temperature source Oral, resp. rate 18, height 5\' 8"  (1.727 m), weight 62.279 kg (137 lb 4.8 oz), SpO2 97 %.  GEN:  Pleasant patient lying in the stretcher in no acute distress; cooperative with exam. PSYCH:  alert and oriented x4; does not appear anxious or depressed; affect is appropriate. HEENT: Mucous membranes pink and anicteric; PERRLA; EOM intact; no cervical lymphadenopathy nor thyromegaly or carotid bruit; no JVD; There were no stridor. Neck is very supple. Breasts:: Not examined CHEST WALL: No tenderness CHEST: Normal respiration, clear to auscultation bilaterally.  HEART: Regular rate and rhythm.  There are no murmur, rub, or gallops.   BACK: No kyphosis or scoliosis; no CVA tenderness ABDOMEN: soft and non-tender; no masses, no organomegaly, normal abdominal bowel sounds; no pannus; no intertriginous candida. There is no rebound and no distention. Rectal Exam: Not done EXTREMITIES: No bone or joint deformity; age-appropriate arthropathy of the hands and knees; no edema; no ulcerations.  There is no calf tenderness. Genitalia: not examined PULSES: 2+ and symmetric SKIN: Normal hydration no rash or ulceration CNS: Cranial nerves 2-12 grossly intact no focal lateralizing neurologic deficit.  Speech is fluent; uvula elevated with phonation, facial symmetry and tongue midline. DTR are normal bilaterally, cerebella exam is intact, barbinski is negative and  strengths are equaled bilaterally.  No sensory loss.   Labs on Admission:  Basic Metabolic Panel:  Recent Labs Lab 02/22/15 1413 02/23/15 0751  NA 137 138  K 4.6 4.5  CL 104 107  CO2 28 25  GLUCOSE 111* 116*  BUN 21* 18  CREATININE 1.57* 1.37*  CALCIUM 9.2 8.4*   Liver Function Tests:  Recent Labs Lab 02/22/15 1413  AST 13*  ALT 16*  ALKPHOS 71  BILITOT 0.4  PROT 7.6  ALBUMIN 4.0   No results for input(s): LIPASE, AMYLASE in the last 168 hours. No results for input(s): AMMONIA in the last 168 hours. CBC:  Recent Labs Lab 02/22/15 1413 02/23/15 0751  WBC 7.2 7.2  NEUTROABS 5.1  --   HGB 7.5* 9.9*  HCT 23.4* 29.0*  MCV 99.6 93.2  PLT 194 162   Cardiac Enzymes:  Recent Labs Lab 02/22/15 1413  TROPONINI <0.03    CBG: No results for input(s): GLUCAP in the last 168 hours.   Radiological Exams on Admission: Dg Chest 2 View  02/22/2015   CLINICAL DATA:  One month history shortness of breath and chest pain  EXAM: CHEST  2 VIEW  COMPARISON:  Chest radiograph December 28, 2013; chest CT January 12, 2014  FINDINGS: There is scarring in the left lower lobe region. There is no new opacity in the lungs. No frank edema or consolidation. The heart size and pulmonary vascularity are normal. There is atherosclerotic change in the aorta. Patient is status post coronary artery bypass grafting.  IMPRESSION: Scarring left lower lobe region.  No edema or consolidation.   Electronically Signed   By: Lowella Grip III M.D.   On: 02/22/2015 13:03    Assessment/Plan Present on Admission:  . Symptomatic anemia . Fecal occult blood test positive  PLAN:   Chest tightness :  R/out.  Resolved with transfusion.   ASA OK with GI, continue with PPI.                Anemia:  Will get GI endoscopy tomorrow.                 Other plans as per orders.  Code Status: FULL CODE>    Orvan Falconer, MD. [facility] Pager [phone] 7pm to 7am.  02/23/2015, 3:38 PM

## 2015-02-23 NOTE — Consult Note (Signed)
Referring Provider: No ref. provider found Primary Care Physician:  [name], MD Primary Gastroenterologist:  Barney Drain  Reason for Consultation:  TRANSFUSION DEPENDENT ANEMIA   Impression: ADMITTED WITH NORMOCYTIC ANEMIA -NO OVERT EVIDENCE OF GI BLEEDING. DIFFERENTIAL DIAGNOSIS INCLUDES ANEMIA CHRONIC DISEASE(CRI, CAD/CHF) EXACERBATED BY COLON POLYPS/GASTRITIS, LESS LIKELY COLON CANCER OR COLON/SMALL BOWEL AVMs.  Plan: 1. CLEAR LIQUID DIET 2. TCS/POSSIBLE EGD OCT 2-DISCUSSED WITH PATIENT AND FAMILY THE PROCEDURE, BENEFITS, & RISKS: < 1% chance of medication reaction, bleeding, perforation, or rupture of spleen/liver. 3. DAILY PPI 4. MAY CONTINUE ASA.   HPI:  2012: Hb 10.9, MCV 95.9. 2013: FERRITIN 12. CREATININE 1.32-2.19 SINCE 2012. PT DEVELOPED PROGRESSIVE WEAKNESS, AND DOE OVER PAST 1-2 MOS. 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 BROUGHT TO ED BECAUSE HE WAS HAVING CHEST TIGHTNESS AND DOE. EVALUATION REVEALED HB 7.5, MCV 99.6. PT DENIES EVER HAVING ENDOSCOPY. HAS SOME NAUSEA. C/O INTERMITTENT LEFT GROIN PAIN DUE TO L INGUINAL HERNIA. FEELS MUCH BETTER AFTER BLOOD TRANSFUSION. TAKES ASA DAILY BUT NO PPI.  PT DENIES FEVER, CHILLS, HEMATOCHEZIA, HEMATEMESIS, vomiting, melena, diarrhea, CHEST PAIN, CHANGE IN BOWEL IN HABITS, constipation, abdominal pain, problems swallowing, problems with sedation, OR heartburn or indigestion.  Past Medical History  Diagnosis Date  . Diabetes mellitus, type 2   . Essential hypertension, benign   . Coronary atherosclerosis of native coronary artery 1992    Multivessel s/p CABG 1992  . Hyperlipidemia   . TIA (transient ischemic attack)   . History of pneumonia   . Benign prostatic hypertrophy   . Chronic anemia    Past Surgical History  Procedure Laterality Date  . Coronary artery bypass graft  1992  . Tonsillectomy    . Hernia repair    . Cataract extraction Bilateral    Prior to Admission medications   Medication  Sig Start Date End Date Taking? Authorizing Provider  Albuterol Sulfate (PROAIR HFA IN) Inhale into the lungs as needed.     Yes Historical Provider, MD  aspirin 81 MG tablet Take 81 mg by mouth daily.   Yes Historical Provider, MD  Cholecalciferol (VITAMIN D PO) Take 1 capsule by mouth daily.   Yes Historical Provider, MD  clonazePAM (KLONOPIN) 0.5 MG tablet Take 1 mg by mouth at bedtime.  01/29/15  Yes Historical Provider, MD  finasteride (PROSCAR) 5 MG tablet Take 5 mg by mouth daily.   Yes Historical Provider, MD  Magnesium Oxide (MAG-200 PO) Take 1 tablet by mouth every morning.    Yes Historical Provider, MD  metFORMIN (GLUCOPHAGE) 500 MG tablet Take 500 mg by mouth 2 (two) times daily with a meal.  12/13/13  Yes Historical Provider, MD  Multiple Vitamins-Minerals (ONE-A-DAY 50 PLUS PO) Take 1 tablet by mouth daily.   Yes Historical Provider, MD  quinapril (ACCUPRIL) 20 MG tablet take 1 tablet by mouth once daily 11/28/14  Yes Satira Sark, MD  Tamsulosin HCl (FLOMAX) 0.4 MG CAPS 0.4 mg daily.  07/18/11  Yes Historical Provider, MD  vitamin B-12 (CYANOCOBALAMIN) 1000 MCG tablet Take 1,000 mcg by mouth daily.   Yes Historical Provider, MD    Current Facility-Administered Medications  Medication Dose Route Frequency Provider Last Rate Last Dose  . acetaminophen (TYLENOL) tablet 650 mg  650 mg Oral Q6H PRN Truett Mainland, DO       Or  . acetaminophen (TYLENOL) suppository 650 mg  650 mg Rectal Q6H PRN Truett Mainland, DO      .  albuterol (PROVENTIL) (2.5 MG/3ML) 0.083% nebulizer solution 3 mL  3 mL Inhalation Q4H PRN Tanna Savoy Stinson, DO      . alum & mag hydroxide-simeth (MAALOX/MYLANTA) 200-200-20 MG/5ML suspension 30 mL  30 mL Oral Q6H PRN Truett Mainland, DO      . aspirin chewable tablet 81 mg  81 mg Oral Daily Tanna Savoy Stinson, DO      . clonazePAM [facility]) tablet 1 mg  1 mg Oral QHS Tanna Savoy Stinson, DO   1 mg at 02/22/15 2207  . feeding supplement (ENSURE ENLIVE) (ENSURE ENLIVE)  liquid 237 mL  237 mL Oral BID BM Tanna Savoy Stinson, DO      . finasteride (PROSCAR) tablet 5 mg  5 mg Oral Daily Tanna Savoy Stinson, DO      . lisinopril (PRINIVIL,ZESTRIL) tablet 20 mg  20 mg Oral Daily Orvan Falconer, MD      . metFORMIN (GLUCOPHAGE) tablet 500 mg  500 mg Oral BID WC Tanna Savoy Stinson, DO   500 mg at 02/22/15 1902  . ondansetron (ZOFRAN) tablet 4 mg  4 mg Oral Q6H PRN Truett Mainland, DO       Or  . ondansetron [facility]) injection 4 mg  4 mg Intravenous Q6H PRN Tanna Savoy Stinson, DO      . tamsulosin [facility]) capsule 0.4 mg  0.4 mg Oral Daily Tanna Savoy Stinson, DO      . vitamin B-12 (CYANOCOBALAMIN) tablet 1,000 mcg  1,000 mcg Oral Daily Tanna Savoy Stinson, DO        Allergies as of 02/22/2015  . (No Known Allergies)    Family History  Problem Relation Age of Onset  . Cancer Father   . Cancer Mother    Social History   Social History  . Marital Status: Married    Spouse Name: N/A  . Number of Children: N/A  . Years of Education: N/A   Occupational History  . Retired    Social History Main Topics  . Smoking status: Former Smoker -- 40 years    Types: Cigarettes    Start date: 05/26/1939    Quit date: 06/20/1994  . Smokeless tobacco: Not on file  . Alcohol Use: No  . Drug Use: No  . Sexual Activity: Not on file   Other Topics Concern  . Not on file   Social History Narrative   Review of Systems: PER HPI OTHERWISE ALL SYSTEMS ARE NEGATIVE.  Vitals: Blood pressure 110/88, pulse 84, temperature 98.1 F (36.7 C), temperature source Oral, resp. rate 20, height 5\' 8"  (1.727 m), weight 137 lb 4.8 oz (62.279 kg), SpO2 96 %.  Physical Exam: General:   Alert,  Well-developed, well-nourished, pleasant and cooperative in NAD Head:  Normocephalic and atraumatic. Eyes:  Sclera clear, no icterus.   Conjunctiva pink. Mouth:  No lesions, dentition ABnormal. Neck:  Supple; no masses. Lungs:  Clear throughout to auscultation.   No wheezes. No acute distress. Heart:  Regular  rate and rhythm; no murmurs. Abdomen:  Soft, nontender and nondistended. No masses noted. Normal bowel sounds, without guarding, and without rebound.   Msk:  Symmetrical without gross deformities.  Extremities:  Without edema. Neurologic:  Alert and  oriented x4;  NO  NEW FOCAL DEFICITS Psych:  Alert and cooperative. Normal mood and FLAT affect. DAUGHTER ANSWERS MOST OF HIS QUESTIONS FOR HIM.  Lab Results:  Recent Labs  02/22/15 1413 02/23/15 0751  WBC 7.2 7.2  HGB 7.5* 9.9*  HCT  23.4* 29.0*  PLT 194 162   BMET  Recent Labs  02/22/15 1413 02/23/15 0751  NA 137 138  K 4.6 4.5  CL 104 107  CO2 28 25  GLUCOSE 111* 116*  BUN 21* 18  CREATININE 1.57* 1.37*  CALCIUM 9.2 8.4*   LFT  Recent Labs  02/22/15 1413  PROT 7.6  ALBUMIN 4.0  AST 13*  ALT 16*  ALKPHOS 71  BILITOT 0.4     Studies/Results: SEP 30 ECG: NSR, CXR: NACPD     Sandi Fields  02/23/2015, 8:44 AM

## 2015-02-23 NOTE — Progress Notes (Signed)
Initial Nutrition Assessment  DOCUMENTATION CODES:  Not applicable  INTERVENTION:  Ensure Enlive po BID, each supplement provides 350 kcal and 20 grams of protein  NUTRITION DIAGNOSIS:  Inadequate oral intake related to lethargy/fatigue as evidenced by loss of ~5% bw in 8 months  GOAL:  Patient will meet greater than or equal to 90% of their needs  MONITOR:  PO intake, Supplement acceptance, Labs, I & O's  REASON FOR ASSESSMENT:  Malnutrition Screening Tool    ASSESSMENT:  81 y.o. male PMHx CAD w/ hx of CABG, DM2,  HTN, HLD, BPH, chronic anemia, presents to the hospital with one month of intermittent dyspnea, chest pressure and weakness. In ED, found to be severely anemic.    RD operating remotely. Per Notes: Patient has noted progressive fatigue. No mention of pt's recent PO intake.   Pt would appear to have lost ~ 7 lbs since February. This is not clinically significant  NFPE: unable to determine  Diet Order:  Diet Heart Room service appropriate?: Yes; Fluid consistency:: Thin  Skin:  Reviewed, no issues  Last BM:  9/30  Height:  Ht Readings from Last 1 Encounters:  02/22/15 5\' 8"  (1.727 m)   Weight:  Wt Readings from Last 1 Encounters:  02/22/15 137 lb 4.8 oz (62.279 kg)   Wt Readings from Last 10 Encounters:  02/22/15 137 lb 4.8 oz (62.279 kg)  02/20/15 142 lb (64.411 kg)  06/25/14 144 lb 9.6 oz (65.59 kg)  03/21/14 144 lb (65.318 kg)  01/09/14 148 lb (67.132 kg)  12/26/13 147 lb (66.679 kg)  06/02/13 142 lb (64.411 kg)  09/07/11 136 lb (61.689 kg)  08/19/11 138 lb (62.596 kg)  04/29/11 135 lb (61.236 kg)   Ideal Body Weight:  70 kg  BMI:  Body mass index is 20.88 kg/(m^2).  Estimated Nutritional Needs:  Kcal:  1550-1750 (25-28 kcal/kg) Protein:  68-81g (1.1-1.3 g/kg IBW) Fluid:  1.6-1.8 liters fluid  EDUCATION NEEDS:  No education needs identified at this time  Burtis Junes RD, LDN Nutrition Pager: [phone] 02/23/2015 8:24 AM

## 2015-02-24 ENCOUNTER — Telehealth: Payer: Self-pay | Admitting: Gastroenterology

## 2015-02-24 ENCOUNTER — Encounter (HOSPITAL_COMMUNITY)
Admission: EM | Disposition: A | Discharge status: Still In Hospital | Payer: Self-pay | Source: Home / Self Care | Attending: Emergency Medicine

## 2015-02-24 DIAGNOSIS — D649 Anemia, unspecified: Secondary | ICD-10-CM | POA: Diagnosis not present

## 2015-02-24 DIAGNOSIS — R195 Other fecal abnormalities: Secondary | ICD-10-CM | POA: Diagnosis not present

## 2015-02-24 DIAGNOSIS — D509 Iron deficiency anemia, unspecified: Secondary | ICD-10-CM | POA: Diagnosis not present

## 2015-02-24 HISTORY — PX: ESOPHAGOGASTRODUODENOSCOPY: SHX5428

## 2015-02-24 HISTORY — PX: COLONOSCOPY: SHX5424

## 2015-02-24 LAB — TYPE AND SCREEN
ABO/RH(D): O POS
Antibody Screen: NEGATIVE
UNIT DIVISION: 0
Unit division: 0

## 2015-02-24 SURGERY — COLONOSCOPY
Anesthesia: Moderate Sedation

## 2015-02-24 MED ORDER — ATROPINE SULFATE 1 MG/ML IJ SOLN
INTRAMUSCULAR | Status: AC
  Filled 2015-02-24: qty 1

## 2015-02-24 MED ORDER — ATROPINE SULFATE 1 MG/ML IJ SOLN
INTRAMUSCULAR | Status: DC | PRN
  Administered 2015-02-24: .5 mg via INTRAVENOUS

## 2015-02-24 MED ORDER — SODIUM CHLORIDE 0.9 % IV SOLN
510.0000 mg | Freq: Once | INTRAVENOUS | Status: AC
  Administered 2015-02-24: 510 mg via INTRAVENOUS
  Filled 2015-02-24: qty 17

## 2015-02-24 MED ORDER — MIDAZOLAM HCL 5 MG/5ML IJ SOLN
INTRAMUSCULAR | Status: DC | PRN
  Administered 2015-02-24 (×2): 1 mg via INTRAVENOUS
  Administered 2015-02-24: 2 mg via INTRAVENOUS

## 2015-02-24 MED ORDER — MIDAZOLAM HCL 5 MG/5ML IJ SOLN
INTRAMUSCULAR | Status: AC
  Filled 2015-02-24: qty 10

## 2015-02-24 MED ORDER — LIDOCAINE VISCOUS 2 % MT SOLN
OROMUCOSAL | Status: AC
  Filled 2015-02-24: qty 15

## 2015-02-24 MED ORDER — SODIUM CHLORIDE 0.9 % IV SOLN: 510.0000 mg | Freq: Once | INTRAVENOUS | Status: DC

## 2015-02-24 MED ORDER — MEPERIDINE HCL 100 MG/ML IJ SOLN
INTRAMUSCULAR | Status: DC | PRN
  Administered 2015-02-24: 25 mg via INTRAVENOUS

## 2015-02-24 MED ORDER — MEPERIDINE HCL 100 MG/ML IJ SOLN
INTRAMUSCULAR | Status: AC
  Filled 2015-02-24: qty 2

## 2015-02-24 NOTE — Op Note (Signed)
[facility] [address] [place], [address]   COLONOSCOPY PROCEDURE REPORT  PATIENT: [name], [name]  MR#: [id number] BIRTHDATE: 1931/05/03 , 70  yrs. old GENDER: male ENDOSCOPIST: Danie Binder, MD REFERRED MI:WOEH Hall, M.D.  Rozann Lesches, M.D. PROCEDURE DATE:  03/14/15 PROCEDURE:   Colonoscopy, diagnostic INDICATIONS:iron deficiency anemia. MEDICATIONS: Demerol 75 mg IV, Versed 5 mg IV, and Atropine 0.5 mg IV  DESCRIPTION OF PROCEDURE:    Physical exam was performed.  Informed consent was obtained from the patient after explaining the benefits, risks, and alternatives to procedure.  The patient was connected to monitor and placed in left lateral position. Continuous oxygen was provided by nasal cannula and IV medicine administered through an indwelling cannula.  After administration of sedation and rectal exam, the patients rectum was intubated and the EC-[id number] ([id number])  colonoscope was advanced under direct visualization to the [place].  The scope was removed slowly by carefully examining the color, texture, anatomy, and integrity mucosa on the way out.  The patient was recovered in endoscopy and discharged home in satisfactory condition. Estimated blood loss is zero unless otherwise noted in this procedure report.     COLON FINDINGS: The colon IS REDUNDANT. UNABLE TO ADVANCE PAST DISTAL TRANSVERSE COLON IN SPITE OF THE Patient BEING moved on to HIS back, AND STOMACH, ABDOMINAL PRESSURE, CHANGING SCOPES FROM ADULT TO PEDIATRIC AND UTILIZING AN OVERTUBE. There was moderate diverticulosis noted in the sigmoid colon and descending colon with associated muscular hypertrophy, angulation and tortuosity.  Small internal hemorrhoids were found.  Moderate sized external hemorrhoids were found.  PREP QUALITY: good.       COMPLICATIONS: HR DROPPED TO 31 WHEN SCOPE PASSING THROUGH SIGMOID COLON. ATROPIN 0.5 MG IV GIVEN. HR REMAINED >  60.  ENDOSCOPIC IMPRESSION: 1.   INCOMPLETE COLONOSCOPY DUE TO SEVERELY REDUNDANT LEFT COLON 2.   Moderate diverticulosis in the sigmoid colon and descending colon 3.   RECTAL BLEEDING DUE TO Small internal hemorrhoids 4.   Moderate sized external hemorrhoids  RECOMMENDATIONS: Follow a DIABETIC diet. PROTONIX 30 MINUTES PRIOR TO breakfast DAILY. IV IRON INFUSION TODAY.  REPEAT CBC AND FERRITIN IN ONE MONTH. [facility] FOR A COLONOSCOPY WITH FLUOROSCOPY AND AN OVERTUBE WITHIN THE NEXT MONTH AT [facility]. FOLLOW UP IN 3 MOS.  eSigned:  Danie Binder, MD Mar 14, 2015 9:33 AM    CPT CODES: ICD CODES:  The ICD and CPT codes recommended by this software are interpretations from the data that the clinical staff has captured with the software.  The verification of the translation of this report to the ICD and CPT codes and modifiers is the sole responsibility of the health care institution and practicing physician where this report was generated.  [place]. will not be held responsible for the validity of the ICD and CPT codes included on this report.  AMA assumes no liability for data contained or not contained herein. CPT is a Designer, television/film set of the [employer].

## 2015-02-24 NOTE — Discharge Instructions (Signed)
YOU HAVE A FLOPPY LEFT COLON WHICH WOULD NOT ALLOW DR. Alicyn Klann TO COMPLETE THE COLONOSCOPY. You have an esophageal stricture, a small hiatal hernia, and moderate gastritis & mild duodenitis DUE TO ASA USE. You have internal hemorrhoids, WHICH CAN BE THE CAUSE OF RECTAL BLEEDING.  I BIOPSIED YOUR STOMACH and SMALL BOWEL.    Follow a DIABETIC diet.  CONTINUE PROTONIX 30 MINUTES PRIOR TO breakfast DAILY.  YOU RECEIVED AN IV IRON INFUSION TODAY. YOU NEED A REPEAT CBC AND FERRITIN IN ONE MONTH.  I WILL CONTACT [facility] AND [facility] FOR A COLONOSCOPY WITH FLUOROSCOPY AND AN OVERTUBE WITHIN THE NEXT MONTH.   YOUR BIOPSY RESULTS WILL BE AVAILABLE IN MY CHART AFTER OCT 4 AND MY OFFICE WILL CONTACT YOU IN 10-14 DAYS WITH YOUR RESULTS.   FOLLOW UP IN 3 MOS.   ENDOSCOPY Care After Read the instructions outlined below and refer to this sheet in the next week. These discharge instructions provide you with general information on caring for yourself after you leave the hospital. While your treatment has been planned according to the most current medical practices available, unavoidable complications occasionally occur. If you have any problems or questions after discharge, call DR. Gali Spinney, [phone].  ACTIVITY  You may resume your regular activity, but move at a slower pace for the next 24 hours.   Take frequent rest periods for the next 24 hours.   Walking will help get rid of the air and reduce the bloated feeling in your belly (abdomen).   No driving for 24 hours (because of the medicine (anesthesia) used during the test).   You may shower.   Do not sign any important legal documents or operate any machinery for 24 hours (because of the anesthesia used during the test).    NUTRITION  Drink plenty of fluids.   You may resume your normal diet as instructed by your doctor.   Begin with a light meal and progress to your normal diet. Heavy or fried foods are harder to digest and may make you  feel sick to your stomach (nauseated).   Avoid alcoholic beverages for 24 hours or as instructed.    MEDICATIONS  You may resume your normal medications.   WHAT YOU CAN EXPECT TODAY  Some feelings of bloating in the abdomen.   Passage of more gas than usual.   Spotting of blood in your stool or on the toilet paper  .  IF YOU HAD POLYPS REMOVED DURING THE ENDOSCOPY:  Eat a soft diet IF YOU HAVE NAUSEA, BLOATING, ABDOMINAL PAIN, OR VOMITING.    FINDING OUT THE RESULTS OF YOUR TEST Not all test results are available during your visit. DR. Oneida Alar WILL CALL YOU WITHIN 14 DAYS OF YOUR PROCEDUE WITH YOUR RESULTS. Do not assume everything is normal if you have not heard from DR. Perrin Gens, CALL HER OFFICE AT [phone].  SEEK IMMEDIATE MEDICAL ATTENTION AND CALL THE OFFICE: [phone] IF:  You have more than a spotting of blood in your stool.   Your belly is swollen (abdominal distention).   You are nauseated or vomiting.   You have a temperature over 101F.   You have abdominal pain or discomfort that is severe or gets worse throughout the day.   Gastritis/DUODENITIS  Gastritis is an inflammation (the body's way of reacting to injury and/or infection) of the stomach. DUODENITIS is an inflammation (the body's way of reacting to injury and/or infection) of the FIRST PART OF THE SMALL INTESTINES. It is often caused  by bacterial (germ) infections. It can also be caused BY ASPIRIN, BC/GOODY POWDER'S, (IBUPROFEN) MOTRIN, OR ALEVE (NAPROXEN), chemicals (including alcohol), SPICY FOODS, and medications. This illness may be associated with generalized malaise (feeling tired, not well), UPPER ABDOMINAL STOMACH cramps, and fever. One common bacterial cause of gastritis is an organism known as H. Pylori. This can be treated with antibiotics.  Diverticulosis Diverticulosis is a common condition that develops when small pouches (diverticula) form in the wall of the colon. The risk of  diverticulosis increases with age. It happens more often in people who eat a low-fiber diet. Most individuals with diverticulosis have no symptoms. Those individuals with symptoms usually experience belly (abdominal) pain, constipation, or loose stools (diarrhea).  HOME CARE INSTRUCTIONS  Increase the amount of fiber in your diet as directed by your caregiver or dietician. This may reduce symptoms of diverticulosis.   Drink at least 6 to 8 glasses of water each day to prevent constipation.   Try not to strain when you have a bowel movement.   Avoiding nuts and seeds to prevent complications is NOT NECESSARY.   FOODS HAVING HIGH FIBER CONTENT INCLUDE:  Fruits. Apple, peach, pear, tangerine, raisins, prunes.   Vegetables. Brussels sprouts, asparagus, broccoli, cabbage, carrot, cauliflower, romaine lettuce, spinach, summer squash, tomato, winter squash, zucchini.   Starchy Vegetables. Baked beans, kidney beans, lima beans, split peas, lentils, potatoes (with skin).   Grains. Whole wheat bread, brown rice, bran flake cereal, plain oatmeal, white rice, shredded wheat, bran muffins.   SEEK IMMEDIATE MEDICAL CARE IF:  You develop increasing pain or severe bloating.   You have an oral temperature above 101F.   You develop vomiting or bowel movements that are bloody or black.     Hemorrhoids Hemorrhoids are dilated (enlarged) veins around the rectum. Sometimes clots will form in the veins. This makes them swollen and painful. These are called thrombosed hemorrhoids. Causes of hemorrhoids include:  Constipation.   Straining to have a bowel movement.   HEAVY LIFTING HOME CARE INSTRUCTIONS  Eat a well balanced diet and drink 6 to 8 glasses of water every day to avoid constipation. You may also use a bulk laxative.   Avoid straining to have bowel movements.   Keep anal area dry and clean.   Do not use a donut shaped pillow or sit on the toilet for long periods. This increases  blood pooling and pain.   Move your bowels when your body has the urge; this will require less straining and will decrease pain and pressure.

## 2015-02-24 NOTE — OR Nursing (Signed)
Heart rate dropped to mid 30 's during procedure, Dr. Oneida Alar ordered 0.5 atropine given. Heart rate increased to 99 .

## 2015-02-24 NOTE — Interval H&P Note (Signed)
History and Physical Interval Note:  02/24/2015 8:00 AM  [name] [name]  has presented today for surgery, with the diagnosis of transfusion dependent anemia  The various methods of treatment have been discussed with the patient and family. After consideration of risks, benefits and other options for treatment, the patient has consented to  Procedure(s): COLONOSCOPY (N/A) ESOPHAGOGASTRODUODENOSCOPY (EGD) (N/A) as a surgical intervention .  The patient's history has been reviewed, patient examined, no change in status, stable for surgery.  I have reviewed the patient's chart and labs.  Questions were answered to the patient's satisfaction.     [employer]

## 2015-02-24 NOTE — Telephone Encounter (Signed)
TCS WITH OVERTUBE/FLUOROSCOPY AT [facility] WITHIN THE NEXT MONTH. REPEAT CBC AND FERRITIN IN ONE MONTH.

## 2015-02-24 NOTE — Op Note (Signed)
[facility] [address] [place], [address]   ENDOSCOPY PROCEDURE REPORT  PATIENT: [name] [name], [name] [name]  MR#: [id number] BIRTHDATE: 12/30/1930 , 89  yrs. old GENDER: male  ENDOSCOPIST: Danie Binder, MD REFERRED ZH:GDJM Hall, M.D.  Rozann Lesches, M.D.  PROCEDURE DATE: 03/17/15 PROCEDURE:   EGD w/ biopsy  INDICATIONS:iron deficiency anemia. MEDICATIONS: TOPICAL ANESTHETIC: ASA CLASS:  DESCRIPTION OF PROCEDURE:     Physical exam was performed.  Informed consent was obtained from the patient after explaining the benefits, risks, and alternatives to the procedure.  The patient was connected to the monitor and placed in the left lateral position.  Continuous oxygen was provided by nasal cannula and IV medicine administered through an indwelling cannula.  After administration of sedation, the patients esophagus was intubated and the [id number] ([id number])  endoscope was advanced under direct visualization to the second portion of the duodenum.  The scope was removed slowly by carefully examining the color, texture, anatomy, and integrity of the mucosa on the way out.  The patient was recovered in endoscopy and discharged home in satisfactory condition.  Estimated blood loss is zero unless otherwise noted in this procedure report.    ESOPHAGUS: There was a stricture at the gastroesophageal junction. The stricture was easily traversable.   STOMACH: A small hiatal hernia was noted.   Moderate non-erosive gastritis (inflammation) was found in the gastric body and gastric antrum.  Multiple biopsies were performed using cold forceps.   DUODENUM: Mild duodenal inflammation was found in the duodenal bulb.   The duodenal mucosa showed no abnormalities in the 2nd part of the duodenum.  Cold forceps biopsies were taken in the bulb and second portion.        COMPLICATIONS: There were no immediate complications.  ENDOSCOPIC IMPRESSION: 1.   PATENT stricture at the  gastroesophageal junction 2.   Small hiatal hernia 3.   MODERATE Non-erosive gastritis AND MILD DUODENITIS  RECOMMENDATIONS: AWAIT BIOPSY. Follow a DIABETIC diet. PROTONIX 30 MINUTES PRIOR TO breakfast DAILY. IV IRON INFUSION TODAY.  REPEAT CBC AND FERRITIN IN ONE MONTH. ARRANGE FOR A COLONOSCOPY WITH FLUOROSCOPY AND AN OVERTUBE WITHIN THE NEXT MONTH AT [facility]. FOLLOW UP IN 3 MOS.  REPEAT EXAM: eSigned:  Danie Binder, MD 2015-03-17 9:38 AMevised  CPT CODES: ICD CODES:  The ICD and CPT codes recommended by this software are interpretations from the data that the clinical staff has captured with the software.  The verification of the translation of this report to the ICD and CPT codes and modifiers is the sole responsibility of the health care institution and practicing physician where this report was generated.  [place]. will not be held responsible for the validity of the ICD and CPT codes included on this report.  AMA assumes no liability for data contained or not contained herein. CPT is a Designer, television/film set of the [employer].

## 2015-02-24 NOTE — H&P (View-Only) (Signed)
Referring Provider: No ref. provider found Primary Care Physician:  [name], MD Primary Gastroenterologist:  Barney Drain  Reason for Consultation:  TRANSFUSION DEPENDENT ANEMIA   Impression: ADMITTED WITH NORMOCYTIC ANEMIA -NO OVERT EVIDENCE OF GI BLEEDING. DIFFERENTIAL DIAGNOSIS INCLUDES ANEMIA CHRONIC DISEASE(CRI, CAD/CHF) EXACERBATED BY COLON POLYPS/GASTRITIS, LESS LIKELY COLON CANCER OR COLON/SMALL BOWEL AVMs.  Plan: 1. CLEAR LIQUID DIET 2. TCS/POSSIBLE EGD OCT 2-DISCUSSED WITH PATIENT AND FAMILY THE PROCEDURE, BENEFITS, & RISKS: < 1% chance of medication reaction, bleeding, perforation, or rupture of spleen/liver. 3. DAILY PPI 4. MAY CONTINUE ASA.   HPI:  2012: Hb 10.9, MCV 95.9. 2013: FERRITIN 12. CREATININE 1.32-2.19 SINCE 2012. PT DEVELOPED PROGRESSIVE WEAKNESS, AND DOE OVER PAST 1-2 MOS. 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 BROUGHT TO ED BECAUSE HE WAS HAVING CHEST TIGHTNESS AND DOE. EVALUATION REVEALED HB 7.5, MCV 99.6. PT DENIES EVER HAVING ENDOSCOPY. HAS SOME NAUSEA. C/O INTERMITTENT LEFT GROIN PAIN DUE TO L INGUINAL HERNIA. FEELS MUCH BETTER AFTER BLOOD TRANSFUSION. TAKES ASA DAILY BUT NO PPI.  PT DENIES FEVER, CHILLS, HEMATOCHEZIA, HEMATEMESIS, vomiting, melena, diarrhea, CHEST PAIN, CHANGE IN BOWEL IN HABITS, constipation, abdominal pain, problems swallowing, problems with sedation, OR heartburn or indigestion.  Past Medical History  Diagnosis Date  . Diabetes mellitus, type 2   . Essential hypertension, benign   . Coronary atherosclerosis of native coronary artery 1992    Multivessel s/p CABG 1992  . Hyperlipidemia   . TIA (transient ischemic attack)   . History of pneumonia   . Benign prostatic hypertrophy   . Chronic anemia    Past Surgical History  Procedure Laterality Date  . Coronary artery bypass graft  1992  . Tonsillectomy    . Hernia repair    . Cataract extraction Bilateral    Prior to Admission medications   Medication  Sig Start Date End Date Taking? Authorizing Provider  Albuterol Sulfate (PROAIR HFA IN) Inhale into the lungs as needed.     Yes Historical Provider, MD  aspirin 81 MG tablet Take 81 mg by mouth daily.   Yes Historical Provider, MD  Cholecalciferol (VITAMIN D PO) Take 1 capsule by mouth daily.   Yes Historical Provider, MD  clonazePAM (KLONOPIN) 0.5 MG tablet Take 1 mg by mouth at bedtime.  01/29/15  Yes Historical Provider, MD  finasteride (PROSCAR) 5 MG tablet Take 5 mg by mouth daily.   Yes Historical Provider, MD  Magnesium Oxide (MAG-200 PO) Take 1 tablet by mouth every morning.    Yes Historical Provider, MD  metFORMIN (GLUCOPHAGE) 500 MG tablet Take 500 mg by mouth 2 (two) times daily with a meal.  12/13/13  Yes Historical Provider, MD  Multiple Vitamins-Minerals (ONE-A-DAY 50 PLUS PO) Take 1 tablet by mouth daily.   Yes Historical Provider, MD  quinapril (ACCUPRIL) 20 MG tablet take 1 tablet by mouth once daily 11/28/14  Yes Satira Sark, MD  Tamsulosin HCl (FLOMAX) 0.4 MG CAPS 0.4 mg daily.  07/18/11  Yes Historical Provider, MD  vitamin B-12 (CYANOCOBALAMIN) 1000 MCG tablet Take 1,000 mcg by mouth daily.   Yes Historical Provider, MD    Current Facility-Administered Medications  Medication Dose Route Frequency Provider Last Rate Last Dose  . acetaminophen (TYLENOL) tablet 650 mg  650 mg Oral Q6H PRN Truett Mainland, DO       Or  . acetaminophen (TYLENOL) suppository 650 mg  650 mg Rectal Q6H PRN Truett Mainland, DO      .  albuterol (PROVENTIL) (2.5 MG/3ML) 0.083% nebulizer solution 3 mL  3 mL Inhalation Q4H PRN Tanna Savoy Stinson, DO      . alum & mag hydroxide-simeth (MAALOX/MYLANTA) 200-200-20 MG/5ML suspension 30 mL  30 mL Oral Q6H PRN Truett Mainland, DO      . aspirin chewable tablet 81 mg  81 mg Oral Daily Tanna Savoy Stinson, DO      . clonazePAM [facility] [place]) tablet 1 mg  1 mg Oral QHS Tanna Savoy Stinson, DO   1 mg at 02/22/15 2207  . feeding supplement (ENSURE ENLIVE) (ENSURE ENLIVE)  liquid 237 mL  237 mL Oral BID BM Tanna Savoy Stinson, DO      . finasteride (PROSCAR) tablet 5 mg  5 mg Oral Daily Tanna Savoy Stinson, DO      . lisinopril (PRINIVIL,ZESTRIL) tablet 20 mg  20 mg Oral Daily Orvan Falconer, MD      . metFORMIN (GLUCOPHAGE) tablet 500 mg  500 mg Oral BID WC Tanna Savoy Stinson, DO   500 mg at 02/22/15 1902  . ondansetron (ZOFRAN) tablet 4 mg  4 mg Oral Q6H PRN Truett Mainland, DO       Or  . ondansetron [facility]) injection 4 mg  4 mg Intravenous Q6H PRN Tanna Savoy Stinson, DO      . tamsulosin [facility]) capsule 0.4 mg  0.4 mg Oral Daily Tanna Savoy Stinson, DO      . vitamin B-12 (CYANOCOBALAMIN) tablet 1,000 mcg  1,000 mcg Oral Daily Tanna Savoy Stinson, DO        Allergies as of 02/22/2015  . (No Known Allergies)    Family History  Problem Relation Age of Onset  . Cancer Father   . Cancer Mother    Social History   Social History  . Marital Status: Married    Spouse Name: N/A  . Number of Children: N/A  . Years of Education: N/A   Occupational History  . Retired    Social History Main Topics  . Smoking status: Former Smoker -- 87 years    Types: Cigarettes    Start date: 05/26/1939    Quit date: 06/20/1994  . Smokeless tobacco: Not on file  . Alcohol Use: No  . Drug Use: No  . Sexual Activity: Not on file   Other Topics Concern  . Not on file   Social History Narrative   Review of Systems: PER HPI OTHERWISE ALL SYSTEMS ARE NEGATIVE.  Vitals: Blood pressure 110/88, pulse 84, temperature 98.1 F (36.7 C), temperature source Oral, resp. rate 20, height 5\' 8"  (1.727 m), weight 137 lb 4.8 oz (62.279 kg), SpO2 96 %.  Physical Exam: General:   Alert,  Well-developed, well-nourished, pleasant and cooperative in NAD Head:  Normocephalic and atraumatic. Eyes:  Sclera clear, no icterus.   Conjunctiva pink. Mouth:  No lesions, dentition ABnormal. Neck:  Supple; no masses. Lungs:  Clear throughout to auscultation.   No wheezes. No acute distress. Heart:  Regular  rate and rhythm; no murmurs. Abdomen:  Soft, nontender and nondistended. No masses noted. Normal bowel sounds, without guarding, and without rebound.   Msk:  Symmetrical without gross deformities.  Extremities:  Without edema. Neurologic:  Alert and  oriented x4;  NO  NEW FOCAL DEFICITS Psych:  Alert and cooperative. Normal mood and FLAT affect. DAUGHTER ANSWERS MOST OF HIS QUESTIONS FOR HIM.  Lab Results:  Recent Labs  02/22/15 1413 02/23/15 0751  WBC 7.2 7.2  HGB 7.5* 9.9*  HCT  23.4* 29.0*  PLT 194 162   BMET  Recent Labs  02/22/15 1413 02/23/15 0751  NA 137 138  K 4.6 4.5  CL 104 107  CO2 28 25  GLUCOSE 111* 116*  BUN 21* 18  CREATININE 1.57* 1.37*  CALCIUM 9.2 8.4*   LFT  Recent Labs  02/22/15 1413  PROT 7.6  ALBUMIN 4.0  AST 13*  ALT 16*  ALKPHOS 71  BILITOT 0.4     Studies/Results: SEP 30 ECG: NSR, CXR: NACPD     Sandi Fields  02/23/2015, 8:44 AM

## 2015-02-24 NOTE — Progress Notes (Signed)
[facility] PROGRESS NOTE  [name] [name] HDQ:[id number] DOB: [birth year]    PCP:   [name], MD   HPI:  [name] [name] is an 81 y.o. male admitted yesterday with chest tightness, resolved with transfusion from Hb of 7.5 gr to Hb of 10.9 d/dL today. He has normocytic anemia, but prior ferritin was already low. GI saw him and Colonsocopy and EGD were done today.  It showed patent stricture at the [place], and moderate erosive gastritis and duodenitis.  He has been on PPI.  Dr Oneida Alar gave iron IV, and planned follow up.    Rewiew of Systems: Negative.   Past Medical History  Diagnosis Date  . Diabetes mellitus, type 2   . Essential hypertension, benign   . Coronary atherosclerosis of native coronary artery 1992    Multivessel s/p CABG 1992  . Hyperlipidemia   . TIA (transient ischemic attack)   . History of pneumonia   . Benign prostatic hypertrophy   . Chronic anemia     Past Surgical History  Procedure Laterality Date  . Coronary artery bypass graft  1992  . Tonsillectomy    . Hernia repair    . Cataract extraction Bilateral     Medications:  HOME MEDS: Prior to Admission medications   Medication Sig Start Date End Date Taking? Authorizing Provider  Albuterol Sulfate (PROAIR HFA IN) Inhale into the lungs as needed.     Yes Historical Provider, MD  aspirin 81 MG tablet Take 81 mg by mouth daily.   Yes Historical Provider, MD  Cholecalciferol (VITAMIN D PO) Take 1 capsule by mouth daily.   Yes Historical Provider, MD  clonazePAM (KLONOPIN) 0.5 MG tablet Take 1 mg by mouth at bedtime.  01/29/15  Yes Historical Provider, MD  finasteride (PROSCAR) 5 MG tablet Take 5 mg by mouth daily.   Yes Historical Provider, MD  Magnesium Oxide (MAG-200 PO) Take 1 tablet by mouth every morning.    Yes Historical Provider, MD  metFORMIN (GLUCOPHAGE) 500 MG tablet Take 500 mg by mouth 2 (two) times daily with a meal.  12/13/13  Yes Historical Provider, MD  Multiple Vitamins-Minerals  (ONE-A-DAY 50 PLUS PO) Take 1 tablet by mouth daily.   Yes Historical Provider, MD  quinapril (ACCUPRIL) 20 MG tablet take 1 tablet by mouth once daily 11/28/14  Yes Satira Sark, MD  Tamsulosin HCl (FLOMAX) 0.4 MG CAPS 0.4 mg daily.  07/18/11  Yes Historical Provider, MD  vitamin B-12 (CYANOCOBALAMIN) 1000 MCG tablet Take 1,000 mcg by mouth daily.   Yes Historical Provider, MD     Allergies:  No Known Allergies  Social History:   reports that he quit smoking about 20 years ago. His smoking use included Cigarettes. He started smoking about 75 years ago. He quit after 40 years of use. He does not have any smokeless tobacco history on file. He reports that he does not drink alcohol or use illicit drugs.  Family History: Family History  Problem Relation Age of Onset  . Cancer Father   . Cancer Mother      Physical Exam: Filed Vitals:   02/24/15 0905 02/24/15 0910 02/24/15 0915 02/24/15 0920  BP: 132/82 118/77 107/67 107/66  Pulse: 102 95 93 90  Temp:      TempSrc:      Resp: 11 9 11 10   Height:      Weight:      SpO2: 99% 100% 100% 100%   Blood pressure 107/66, pulse  90, temperature 98 F (36.7 C), temperature source Oral, resp. rate 10, height 5\' 8"  (1.727 m), weight 62.279 kg (137 lb 4.8 oz), SpO2 100 %.  GEN:  Pleasant patient lying in the stretcher in no acute distress; cooperative with exam. PSYCH:  alert and oriented x4; does not appear anxious or depressed; affect is appropriate. HEENT: Mucous membranes pink and anicteric; PERRLA; EOM intact; no cervical lymphadenopathy nor thyromegaly or carotid bruit; no JVD; There were no stridor. Neck is very supple. Breasts:: Not examined CHEST WALL: No tenderness CHEST: Normal respiration, clear to auscultation bilaterally.  HEART: Regular rate and rhythm.  There are no murmur, rub, or gallops.   BACK: No kyphosis or scoliosis; no CVA tenderness ABDOMEN: soft and non-tender; no masses, no organomegaly, normal abdominal bowel  sounds; no pannus; no intertriginous candida. There is no rebound and no distention. Rectal Exam: Not done EXTREMITIES: No bone or joint deformity; age-appropriate arthropathy of the hands and knees; no edema; no ulcerations.  There is no calf tenderness. Genitalia: not examined PULSES: 2+ and symmetric SKIN: Normal hydration no rash or ulceration CNS: Cranial nerves 2-12 grossly intact no focal lateralizing neurologic deficit.  Speech is fluent; uvula elevated with phonation, facial symmetry and tongue midline. DTR are normal bilaterally, cerebella exam is intact, barbinski is negative and strengths are equaled bilaterally.  No sensory loss.   Labs on Admission:  Basic Metabolic Panel:  Recent Labs Lab 02/22/15 1413 02/23/15 0751  NA 137 138  K 4.6 4.5  CL 104 107  CO2 28 25  GLUCOSE 111* 116*  BUN 21* 18  CREATININE 1.57* 1.37*  CALCIUM 9.2 8.4*   Liver Function Tests:  Recent Labs Lab 02/22/15 1413  AST 13*  ALT 16*  ALKPHOS 71  BILITOT 0.4  PROT 7.6  ALBUMIN 4.0   CBC:  Recent Labs Lab 02/22/15 1413 02/23/15 0751  WBC 7.2 7.2  NEUTROABS 5.1  --   HGB 7.5* 9.9*  HCT 23.4* 29.0*  MCV 99.6 93.2  PLT 194 162   Cardiac Enzymes:  Recent Labs Lab 02/22/15 1413  TROPONINI <0.03    Radiological Exams on Admission: Dg Chest 2 View  02/22/2015   CLINICAL DATA:  One month history shortness of breath and chest pain  EXAM: CHEST  2 VIEW  COMPARISON:  Chest radiograph December 28, 2013; chest CT January 12, 2014  FINDINGS: There is scarring in the left lower lobe region. There is no new opacity in the lungs. No frank edema or consolidation. The heart size and pulmonary vascularity are normal. There is atherosclerotic change in the aorta. Patient is status post coronary artery bypass grafting.  IMPRESSION: Scarring left lower lobe region.  No edema or consolidation.   Electronically Signed   By: Lowella Grip III M.D.   On: 02/22/2015 13:03   Assessment/Plan Present  on Admission:  . Symptomatic anemia . Fecal occult blood test positive  PLAN:  IV iron today.  Plan to check H and H in am, and plan to discharge in the morning.   Follow up as outlined.   He is stable.   Other plans as per orders.  Code Status: FULL Haskel Khan, MD. [facility] Pager [phone] 7pm to 7am.  02/24/2015, 12:01 PM

## 2015-02-25 ENCOUNTER — Other Ambulatory Visit: Payer: Self-pay

## 2015-02-25 DIAGNOSIS — R195 Other fecal abnormalities: Secondary | ICD-10-CM

## 2015-02-25 DIAGNOSIS — D649 Anemia, unspecified: Secondary | ICD-10-CM | POA: Diagnosis not present

## 2015-02-25 DIAGNOSIS — D509 Iron deficiency anemia, unspecified: Secondary | ICD-10-CM | POA: Diagnosis not present

## 2015-02-25 LAB — OCCULT BLOOD, POC DEVICE: Fecal Occult Bld: POSITIVE — AB

## 2015-02-25 MED ORDER — PANTOPRAZOLE SODIUM 40 MG PO TBEC: 40.0000 mg | DELAYED_RELEASE_TABLET | Freq: Every day | ORAL | Status: AC

## 2015-02-25 NOTE — Progress Notes (Signed)
    Subjective: No abdominal pain, N/V, no overt GI bleeding. Tolerating diet.   Objective: Vital signs in last 24 hours: Temp:  [97.5 F (36.4 C)-99.1 F (37.3 C)] 98.1 F (36.7 C) (10/03 0549) Pulse Rate:  [82-104] 99 (10/03 0549) Resp:  [9-19] 18 (10/03 0549) BP: (104-144)/(52-108) 119/52 mmHg (10/03 0549) SpO2:  [93 %-100 %] 93 % (10/03 0549) Last BM Date: 02/24/15 General:   Alert and oriented, pleasant Head:  Normocephalic and atraumatic. Eyes:  No icterus, sclera clear. Conjuctiva pink.  Abdomen:  Bowel sounds present, soft, non-tender, non-distended. No HSM or hernias noted. No rebound or guarding. No masses appreciated  Extremities:  Without  edema. Neurologic:  Alert and  oriented x4;  grossly normal neurologically. Psych:  Alert and cooperative. Normal mood and affect.  Intake/Output from previous day: 10/02 0701 - 10/03 0700 In: 440 [P.O.:240; I.V.:200] Out: -  Intake/Output this shift:    Lab Results:  Recent Labs  02/22/15 1413 02/23/15 0751  WBC 7.2 7.2  HGB 7.5* 9.9*  HCT 23.4* 29.0*  PLT 194 162   BMET  Recent Labs  02/22/15 1413 02/23/15 0751  NA 137 138  K 4.6 4.5  CL 104 107  CO2 28 25  GLUCOSE 111* 116*  BUN 21* 18  CREATININE 1.57* 1.37*  CALCIUM 9.2 8.4*   LFT  Recent Labs  02/22/15 1413  PROT 7.6  ALBUMIN 4.0  AST 13*  ALT 16*  ALKPHOS 71  BILITOT 0.4    Assessment: 81 year old male admitted with symptomatic anemia,  heme positive stool, with EGD noting patent stricture at GE junction, moderate non-erosive gastritis and mild duodenitis, colonoscopy incomplete due to severely redundant left colon. Hgb improved with 2 units PRBCs. No evidence of overt GI bleeding and clinically stable.  May ultimately need capsule study if colonoscopy unrevealing at [facility] or persistent IDA. Hopeful discharge today.     Plan: Follow-up on pending biopsies Colonoscopy with fluoroscopy and overtube at [facility] within next month, our  office to facilitate  Protonix 30 minutes before breakfast daily Outpatient CBC and ferritin in a month   Will arrange outpatient follow-up   Orvil Feil, ANP-BC [facility]     02/25/2015, 7:57 AM

## 2015-02-25 NOTE — Progress Notes (Signed)
Patient discharged home with family.  IV removed  - WNL.  Follow up appointments in place with PCP and GI.  Reviewed new medication and instructed to discontinue use of ASA and NSAIDs per MD orders.  Patient and family verbalize understanding, no questions at this time.

## 2015-02-25 NOTE — Care Management Note (Signed)
Case Management Note  Patient Details  Name: [name] [name] MRN: [id number] Date of Birth: Mar 24, 1931  Expected Discharge Date:    02/25/2015              Expected Discharge Plan:  Home/Self Care  In-House Referral:  NA  Discharge planning Services  CM Consult  Post Acute Care Choice:  NA Choice offered to:  NA  DME Arranged:    DME Agency:     HH Arranged:    [place] Agency:     Status of Service:  Completed, signed off  Medicare Important Message Given:    Date Medicare IM Given:    Medicare IM give by:    Date Additional Medicare IM Given:    Additional Medicare Important Message give by:     If discussed at [place] of Stay Meetings, dates discussed:    Additional Comments: Admitted with symptomatic anemia. Pt is from home, lives with wife and is ind at baseline. Pt has daughter who provides support as needed. Pt has no HH ser vices, DME or med needs prior to admission. Pt discharging home today with self care. No CM needs noted.  Sherald Barge, RN 02/25/2015, 11:13 AM

## 2015-02-25 NOTE — Progress Notes (Deleted)
Discharge instructions given, verbalized understanding, out in stable condition via w/c with staff. 

## 2015-02-25 NOTE — Telephone Encounter (Signed)
LMOM for a return call. ( Lab orders on file for 03/26/2015).

## 2015-02-25 NOTE — Discharge Summary (Signed)
Physician Discharge Summary  [name] [name] ZCH:[id number] DOB: 12-12-1930 DOA: 02/22/2015  PCP: [name], MD  Admit date: 02/22/2015 Discharge date: 02/25/2015  Time spent: 35 minutes  Recommendations for Outpatient Follow-up:  1. Follow up with Dr Oneida Alar in 2 weeks. 2. Follow up with PCP in one week recommnended.   Discharge Diagnoses:  Active Problems:   Symptomatic anemia   Fecal occult blood test positive   Occult GI bleeding   Discharge Condition: Improved.    Diet recommendation: Cardiac.   Filed Weights   02/22/15 1234 02/22/15 1757  Weight: 66.225 kg (146 lb) 62.279 kg (137 lb 4.8 oz)    History of present illness: patient was admitted by Dr Nehemiah Settle on Sept 30, 2016 for chest tightness and weakness, likely due to anemia and GI bleed.  As per his H and P:  " [name] [name] is a 81 y.o. male  With a history of coronary artery disease with a history of CABG, type 2 diabetes, essential hypertension, hyperlipidemia, BPH, chronic anemia who presents to the hospital with one month of intermittent dyspnea and chest pressure and weakness. The symptoms are worse with activity and improves with rest. The patient critically notes that his symptoms get worse when he relates to the mailbox which is approximately 500 feet away from his house. His fatigue has been worsening. He contacted his cardiologist, who instructed the patient come to the hospital for evaluation. In the emergency primary, the patient was noted to be severely anemic.   Hospital Course:  Patient was admitted into the hospital, and GI was consulted.  Dr Oneida Alar saw the patient, and after his chest tightness resolved with transfusions, bring his Hb from 7.5 grams to 10.9 grams, his symptoms resolved.  His troponin was negative.  She took him to the OR, and EGD along with colonoscopy was performed.  EGD showed patent stricture at [place], with moderate erosive gastritis, and duodenitis.  He has been on PPI, and she gave him IV  iron.  His ferritin was low at 22.  He was kept overnight, and his diet was advanced.  His colonsocopy was suboptimal, and will need to be repeated outpatient.  GI was going to help follow up with this.  He had no further bleeding, anxious to go home, and is stable for discharge.  Thank you so much for allowing me to participate in his care.   Discharge Exam: Filed Vitals:   02/25/15 0549  BP: 119/52  Pulse: 99  Temp: 98.1 F (36.7 C)  Resp: 18    Discharge Instructions   Discharge Instructions    Diet - low sodium heart healthy    Complete by:  As directed      Discharge instructions    Complete by:  As directed   Be sure to follow up with Dr Oneida Alar office so you can get your outpatient testing complete.  Don't take Non-steroidal anti inflammatory drugs like Aleve, Ibuprofen, Advil, Naprosyn, and don't take any aspirin or aspirin products.  Take your medicine as directed.     Increase activity slowly    Complete by:  As directed           Current Discharge Medication List    START taking these medications   Details  pantoprazole (PROTONIX) 40 MG tablet Take 1 tablet (40 mg total) by mouth daily before breakfast. Qty: 30 tablet, Refills: 1      CONTINUE these medications which have NOT CHANGED   Details  Albuterol Sulfate (PROAIR HFA IN) Inhale into the lungs as needed.      Cholecalciferol (VITAMIN D PO) Take 1 capsule by mouth daily.    clonazePAM (KLONOPIN) 0.5 MG tablet Take 1 mg by mouth at bedtime.  Refills: 0    finasteride (PROSCAR) 5 MG tablet Take 5 mg by mouth daily.    metFORMIN (GLUCOPHAGE) 500 MG tablet Take 500 mg by mouth 2 (two) times daily with a meal.     Multiple Vitamins-Minerals (ONE-A-DAY 50 PLUS PO) Take 1 tablet by mouth daily.    quinapril (ACCUPRIL) 20 MG tablet take 1 tablet by mouth once daily Qty: 30 tablet, Refills: 11    Tamsulosin HCl (FLOMAX) 0.4 MG CAPS 0.4 mg daily.     vitamin B-12 (CYANOCOBALAMIN) 1000 MCG tablet Take 1,000  mcg by mouth daily.      STOP taking these medications     aspirin 81 MG tablet      Magnesium Oxide (MAG-200 PO)        No Known Allergies    The results of significant diagnostics from this hospitalization (including imaging, microbiology, ancillary and laboratory) are listed below for reference.    Significant Diagnostic Studies: Dg Chest 2 View  02/22/2015   CLINICAL DATA:  One month history shortness of breath and chest pain  EXAM: CHEST  2 VIEW  COMPARISON:  Chest radiograph December 28, 2013; chest CT January 12, 2014  FINDINGS: There is scarring in the left lower lobe region. There is no new opacity in the lungs. No frank edema or consolidation. The heart size and pulmonary vascularity are normal. There is atherosclerotic change in the aorta. Patient is status post coronary artery bypass grafting.  IMPRESSION: Scarring left lower lobe region.  No edema or consolidation.   Electronically Signed   By: Lowella Grip III M.D.   On: 02/22/2015 13:03    Labs: Basic Metabolic Panel:  Recent Labs Lab 02/22/15 1413 02/23/15 0751  NA 137 138  K 4.6 4.5  CL 104 107  CO2 28 25  GLUCOSE 111* 116*  BUN 21* 18  CREATININE 1.57* 1.37*  CALCIUM 9.2 8.4*   Liver Function Tests:  Recent Labs Lab 02/22/15 1413  AST 13*  ALT 16*  ALKPHOS 71  BILITOT 0.4  PROT 7.6  ALBUMIN 4.0   CBC:  Recent Labs Lab 02/22/15 1413 02/23/15 0751  WBC 7.2 7.2  NEUTROABS 5.1  --   HGB 7.5* 9.9*  HCT 23.4* 29.0*  MCV 99.6 93.2  PLT 194 162   Cardiac Enzymes:  Recent Labs Lab 02/22/15 1413  TROPONINI <0.03   BNP: BNP (last 3 results)  Recent Labs  02/22/15 1446  BNP 84.0   Signed:  Shaunita Seney  [facility] 02/25/2015, 11:03 AM

## 2015-02-25 NOTE — Telephone Encounter (Signed)
Referral made and info sent to [facility]

## 2015-02-26 NOTE — Telephone Encounter (Signed)
PT is aware.

## 2015-02-27 ENCOUNTER — Telehealth: Payer: Self-pay | Admitting: Gastroenterology

## 2015-02-27 ENCOUNTER — Inpatient Hospital Stay (HOSPITAL_COMMUNITY): Admission: RE | Admit: 2015-02-27 | Payer: Medicare Other | Source: Ambulatory Visit

## 2015-02-27 ENCOUNTER — Ambulatory Visit (HOSPITAL_COMMUNITY): Payer: Medicare Other

## 2015-02-27 ENCOUNTER — Other Ambulatory Visit (HOSPITAL_COMMUNITY): Payer: Medicare Other

## 2015-02-27 ENCOUNTER — Encounter (HOSPITAL_COMMUNITY): Payer: Medicare Other

## 2015-02-27 ENCOUNTER — Encounter (HOSPITAL_COMMUNITY): Payer: Self-pay | Admitting: Gastroenterology

## 2015-02-27 NOTE — Telephone Encounter (Signed)
Please call pt. His stomach Bx shows ATROPHIC gastritis AND DUODENITIS. ATROPHIC GASTRITIS MEANS THE ACID CELLS IN HIS STOMACH ARE NOT PRODUCING ENOUGH ACID. HE SHOULD CONTINUE PROTONIX DAILY BUT HE DOES NOT NEED A HIGHER DOSE. FOLLOW UP NOV 2015 E30 ATROPHIC GASTRITIS/DYSPHAGIA. COMPLETE COLONOSCOPY AT [facility]. CBC AND FERRITIN AROUND OCT 31. OPV IN DEC 2016 E30 OBSCURE GI BLEED/ANEMIA.

## 2015-02-28 NOTE — Telephone Encounter (Signed)
PT is aware of results. Lab orders on file.

## 2015-02-28 NOTE — Telephone Encounter (Signed)
Patient scheduled.

## 2015-03-05 ENCOUNTER — Other Ambulatory Visit: Payer: Self-pay

## 2015-03-05 DIAGNOSIS — D649 Anemia, unspecified: Secondary | ICD-10-CM

## 2015-03-11 ENCOUNTER — Ambulatory Visit: Payer: Medicare Other | Admitting: Adult Health

## 2015-03-12 ENCOUNTER — Encounter: Payer: Medicare Other | Admitting: Adult Health

## 2015-03-12 NOTE — Progress Notes (Signed)
Cardiology Office Note   Date:  03/12/2015   ID:  [name] [name], DOB 09-25-1930, MRN [id number] ERROR Cancelled

## 2015-03-15 ENCOUNTER — Encounter: Payer: Self-pay | Admitting: Gastroenterology

## 2015-03-15 ENCOUNTER — Inpatient Hospital Stay: Payer: Medicare Other | Admitting: Gastroenterology

## 2015-03-15 ENCOUNTER — Telehealth: Payer: Self-pay | Admitting: Gastroenterology

## 2015-03-15 NOTE — Telephone Encounter (Signed)
PATIENT WAS A NO SHOW AND LETTER SENT  °

## 2015-03-21 ENCOUNTER — Ambulatory Visit (INDEPENDENT_AMBULATORY_CARE_PROVIDER_SITE_OTHER): Payer: Medicare Other | Admitting: Adult Health

## 2015-03-21 ENCOUNTER — Encounter: Payer: Self-pay | Admitting: Adult Health

## 2015-03-21 VITALS — BP 115/51 | HR 94 | Ht 66.0 in | Wt 136.2 lb

## 2015-03-21 DIAGNOSIS — I2510 Atherosclerotic heart disease of native coronary artery without angina pectoris: Secondary | ICD-10-CM

## 2015-03-21 DIAGNOSIS — E139 Other specified diabetes mellitus without complications: Secondary | ICD-10-CM | POA: Diagnosis not present

## 2015-03-21 DIAGNOSIS — R531 Weakness: Secondary | ICD-10-CM

## 2015-03-21 DIAGNOSIS — D509 Iron deficiency anemia, unspecified: Secondary | ICD-10-CM

## 2015-03-21 DIAGNOSIS — I208 Other forms of angina pectoris: Secondary | ICD-10-CM

## 2015-03-21 NOTE — Progress Notes (Signed)
Cardiology Office Note   Date:  03/21/2015   ID:  JAYTHAN HINELY, DOB Dec 13, 1930, MRN [id number]  PCP:  [name], MD  Cardiologist:  [place]/ Jory Sims, NP   Chief Complaint  Patient presents with  . Coronary Artery Disease  . Fatigue      History of Present Illness: [name] [name] is a 81 y.o. male who presents for ongoing assessment and management of multivessel CAD, s/p CABG,essential hypertension, hyperlipidemia, . Last ischemic evaluation was in 2013. He was admitted to [facility] for recurrent chest pain and fatigue. He was found to be anemic 7.5, with no overt bleeding.  EGD showed patent stricture at [place], with moderate erosive gastritis, and duodenitis.  He comes today with his daughter who is concerned about his continued weakness, lack of appetite and chronic dyspnea. She has not had him follow up with PCP or GI since discharge. He denies chest pain or dizziness. She would like [place] to check his stomach and prescribe a glucose meter as his is broken.   Past Medical History  Diagnosis Date  . Diabetes mellitus, type 2 ([place])   . Essential hypertension, benign   . Coronary atherosclerosis of native coronary artery 1992    Multivessel s/p CABG 1992  . Hyperlipidemia   . TIA (transient ischemic attack)   . History of pneumonia   . Benign prostatic hypertrophy   . Chronic anemia     Past Surgical History  Procedure Laterality Date  . Coronary artery bypass graft  1992  . Tonsillectomy    . Hernia repair    . Cataract extraction Bilateral   . Colonoscopy N/A 02/24/2015    INCOMPLETE DUE TO REDUNDANT LEFT COLON  . Esophagogastroduodenoscopy N/A 02/24/2015    ATROPHIC GASTRITIS, DUODENITIS     Current Outpatient Prescriptions  Medication Sig Dispense Refill  . Albuterol Sulfate (PROAIR HFA IN) Inhale into the lungs as needed.      . Cholecalciferol (VITAMIN D PO) Take 1 capsule by mouth daily.    . clonazePAM (KLONOPIN) 0.5 MG tablet Take 1 mg by mouth at bedtime.   0   . finasteride (PROSCAR) 5 MG tablet Take 5 mg by mouth daily.    . metFORMIN (GLUCOPHAGE) 500 MG tablet Take 500 mg by mouth 2 (two) times daily with a meal.     . Multiple Vitamins-Minerals (ONE-A-DAY 50 PLUS PO) Take 1 tablet by mouth daily.    . pantoprazole (PROTONIX) 40 MG tablet Take 1 tablet (40 mg total) by mouth daily before breakfast. 30 tablet 1  . quinapril (ACCUPRIL) 20 MG tablet take 1 tablet by mouth once daily 30 tablet 11  . Tamsulosin HCl (FLOMAX) 0.4 MG CAPS 0.4 mg daily.     . vitamin B-12 (CYANOCOBALAMIN) 1000 MCG tablet Take 1,000 mcg by mouth daily.     No current facility-administered medications for this visit.    Allergies:   Review of patient's allergies indicates no known allergies.    Social History:  The patient  reports that he quit smoking about 20 years ago. His smoking use included Cigarettes. He started smoking about 75 years ago. He quit after 40 years of use. He does not have any smokeless tobacco history on file. He reports that he does not drink alcohol or use illicit drugs.   Family History:  The patient's family history includes Cancer in his father and mother.    ROS: All other systems are reviewed and negative. Unless otherwise mentioned in H&P  PHYSICAL EXAM: VS:  BP 115/51 mmHg  Pulse 94  Ht 5\' 6"  (1.676 m)  Wt 136 lb 3.2 oz (61.78 kg)  BMI 21.99 kg/m2  SpO2 89% , BMI Body mass index is 21.99 kg/(m^2). GEN: Thin, stoic.  HEENT: normal Neck: no JVD, carotid bruits, or masses Cardiac: RRR, tachycardic; no murmurs, rubs, or gallops,no edema  Respiratory:  clear to auscultation bilaterally, normal work of breathing GI: soft, nontender, nondistended, + BS MS: no deformity or atrophy Skin: warm and dry, no rash Neuro:  Strength and sensation are intact Psych: euthymic mood, full affect   Recent Labs: 02/22/2015: ALT 16*; B Natriuretic Peptide 84.0 02/23/2015: BUN 18; Creatinine, Ser 1.37*; Hemoglobin 9.9*; Platelets 162; Potassium  4.5; Sodium 138    Lipid Panel    Component Value Date/Time   CHOL 143 12/16/2009   TRIG 133 12/16/2009   HDL 44 12/16/2009   LDLCALC 72 12/16/2009      Wt Readings from Last 3 Encounters:  03/21/15 136 lb 3.2 oz (61.78 kg)  02/22/15 137 lb 4.8 oz (62.279 kg)  02/20/15 142 lb (64.411 kg)     ASSESSMENT AND PLAN:  1.  CAD: complaining of ongoing fatigue. Was to have stress test and echo but was admitted for anemia and received blood transfusions. I will move forward with a Lexiscan/Cardiolite and echocardiogram. I will check CBC and BMET. Lab results will go to PCP as well. He will follow up in one month.   2.  Diabetes: He has not been eating well and has not been checking his blood glucose due to a broken meter. He will need to see his PCP for new monitor and follow up on diabetes management. I will check a Hgb A1c.   3. Anemia: Has not been seen by PCP or Gi since discharge. He is advised to see them for follow up. CBC is ordered.    Current medicines are reviewed at length with the patient today.    Labs/ tests ordered today include: CBC, BMET, Hgb A1C. Echo, Lexiscan stress test.   Orders Placed This Encounter  Procedures  . CBC  . Basic Metabolic Panel (BMET)  . HgB A1c  . Echocardiogram     Disposition:   FU with one month.    Signed, Jory Sims, NP  03/21/2015 3:25 PM    [facility] [address] [place], [place] [address] Phone: [phone]; Fax: [phone]

## 2015-03-21 NOTE — Patient Instructions (Signed)
Your physician recommends that you schedule a follow-up appointment in: 1 month  Your physician recommends that you continue on your current medications as directed. Please refer to the Current Medication list given to you today.   If you need a refill on your cardiac medications before your next appointment, please call your pharmacy.   Your physician has requested that you have a lexiscan myoview. For further information please visit [email or web address]. Please follow instruction sheet, as given.   Your physician has requested that you have an echocardiogram. Echocardiography is a painless test that uses sound waves to create images of your heart. It provides your doctor with information about the size and shape of your heart and how well your heart's chambers and valves are working. This procedure takes approximately one hour. There are no restrictions for this procedure.   Your physician recommends that you return for lab work in: [facility]        Thank you for choosing [facility] !

## 2015-03-21 NOTE — Progress Notes (Signed)
.  zrrr

## 2015-03-21 NOTE — Progress Notes (Signed)
Name: [name] [name]    DOB: 1930-08-19  Age: 81 y.o.  MR#: [id number]       PCP:  [name], MD      Insurance: Payor: MEDICARE / Plan: MEDICARE PART A AND B / Product Type: *No Product type* /   CC:   No chief complaint on file.   VS Filed Vitals:   03/21/15 1455  BP: 115/51  Pulse: 94  Height: 5\' 6"  (1.676 m)  Weight: 136 lb 3.2 oz (61.78 kg)  SpO2: 89%    Weights Current Weight  03/21/15 136 lb 3.2 oz (61.78 kg)  02/22/15 137 lb 4.8 oz (62.279 kg)  02/20/15 142 lb (64.411 kg)    Blood Pressure  BP Readings from Last 3 Encounters:  03/21/15 115/51  02/25/15 119/52  02/20/15 118/60     Admit date:  (Not on file) Last encounter with RMR:  11/28/2014   Allergy Review of patient's allergies indicates no known allergies.  Current Outpatient Prescriptions  Medication Sig Dispense Refill  . Albuterol Sulfate (PROAIR HFA IN) Inhale into the lungs as needed.      . Cholecalciferol (VITAMIN D PO) Take 1 capsule by mouth daily.    . clonazePAM (KLONOPIN) 0.5 MG tablet Take 1 mg by mouth at bedtime.   0  . finasteride (PROSCAR) 5 MG tablet Take 5 mg by mouth daily.    . metFORMIN (GLUCOPHAGE) 500 MG tablet Take 500 mg by mouth 2 (two) times daily with a meal.     . Multiple Vitamins-Minerals (ONE-A-DAY 50 PLUS PO) Take 1 tablet by mouth daily.    . pantoprazole (PROTONIX) 40 MG tablet Take 1 tablet (40 mg total) by mouth daily before breakfast. 30 tablet 1  . quinapril (ACCUPRIL) 20 MG tablet take 1 tablet by mouth once daily 30 tablet 11  . Tamsulosin HCl (FLOMAX) 0.4 MG CAPS 0.4 mg daily.     . vitamin B-12 (CYANOCOBALAMIN) 1000 MCG tablet Take 1,000 mcg by mouth daily.     No current facility-administered medications for this visit.    Discontinued Meds:   There are no discontinued medications.  Patient Active Problem List   Diagnosis Date Noted  . Occult GI bleeding   . Symptomatic anemia 02/22/2015  . Chronic kidney disease, stage III (moderate) 02/22/2015  . Fecal  occult blood test positive 02/22/2015  . Acute on chronic renal insufficiency ([place]) 01/09/2014  . Leg pain, bilateral 01/09/2014  . Anemia 09/07/2011  . Diabetes mellitus, type 2 ([place])   . Essential hypertension, benign   . Coronary atherosclerosis of native coronary artery   . Hyperlipidemia   . TIA (transient ischemic attack)   . Tobacco abuse   . Dyspnea 08/19/2011    LABS    Component Value Date/Time   NA 138 02/23/2015 0751   NA 137 02/22/2015 1413   NA 141 01/12/2014 0840   K 4.5 02/23/2015 0751   K 4.6 02/22/2015 1413   K 4.7 01/12/2014 0840   CL 107 02/23/2015 0751   CL 104 02/22/2015 1413   CL 105 01/12/2014 0840   CO2 25 02/23/2015 0751   CO2 28 02/22/2015 1413   CO2 27 01/12/2014 0840   GLUCOSE 116* 02/23/2015 0751   GLUCOSE 111* 02/22/2015 1413   GLUCOSE 117* 01/12/2014 0840   BUN 18 02/23/2015 0751   BUN 21* 02/22/2015 1413   BUN 23 01/12/2014 0840   CREATININE 1.37* 02/23/2015 0751   CREATININE 1.57* 02/22/2015 1413   CREATININE  1.67* 01/12/2014 0840   CREATININE 2.19* 12/26/2013 1345   CREATININE 1.34 04/22/2011 0552   CALCIUM 8.4* 02/23/2015 0751   CALCIUM 9.2 02/22/2015 1413   CALCIUM 8.9 01/12/2014 0840   GFRNONAA 46* 02/23/2015 0751   GFRNONAA 39* 02/22/2015 1413   GFRNONAA 49* 04/22/2011 0552   GFRAA 54* 02/23/2015 0751   GFRAA 46* 02/22/2015 1413   GFRAA 57* 04/22/2011 0552   CMP     Component Value Date/Time   NA 138 02/23/2015 0751   K 4.5 02/23/2015 0751   CL 107 02/23/2015 0751   CO2 25 02/23/2015 0751   GLUCOSE 116* 02/23/2015 0751   BUN 18 02/23/2015 0751   CREATININE 1.37* 02/23/2015 0751   CREATININE 1.67* 01/12/2014 0840   CALCIUM 8.4* 02/23/2015 0751   PROT 7.6 02/22/2015 1413   ALBUMIN 4.0 02/22/2015 1413   AST 13* 02/22/2015 1413   ALT 16* 02/22/2015 1413   ALKPHOS 71 02/22/2015 1413   BILITOT 0.4 02/22/2015 1413   GFRNONAA 46* 02/23/2015 0751   GFRAA 54* 02/23/2015 0751       Component Value Date/Time   WBC 7.2  02/23/2015 0751   WBC 7.2 02/22/2015 1413   WBC 7.8 04/22/2011 0552   HGB 9.9* 02/23/2015 0751   HGB 7.5* 02/22/2015 1413   HGB 10.9* 04/22/2011 0552   HCT 29.0* 02/23/2015 0751   HCT 23.4* 02/22/2015 1413   HCT 32.4* 04/22/2011 0552   MCV 93.2 02/23/2015 0751   MCV 99.6 02/22/2015 1413   MCV 95.9 04/22/2011 0552    Lipid Panel     Component Value Date/Time   CHOL 143 12/16/2009   TRIG 133 12/16/2009   HDL 44 12/16/2009   LDLCALC 72 12/16/2009    ABG No results found for: PHART, PCO2ART, PO2ART, HCO3, TCO2, ACIDBASEDEF, O2SAT   No results found for: TSH BNP (last 3 results)  Recent Labs  02/22/15 1446  BNP 84.0    ProBNP (last 3 results) No results for input(s): PROBNP in the last 8760 hours.  Cardiac Panel (last 3 results) No results for input(s): CKTOTAL, CKMB, TROPONINI, RELINDX in the last 72 hours.  Iron/TIBC/Ferritin/ %Sat    Component Value Date/Time   IRON 73 09/06/2011 1510   TIBC 439* 09/06/2011 1510   FERRITIN 22* 02/23/2015 0500   IRONPCTSAT 17* 09/06/2011 1510     EKG Orders placed or performed during the hospital encounter of 02/22/15  . EKG 12-Lead  . EKG 12-Lead  . ED EKG  . ED EKG  . EKG     Prior Assessment and Plan Problem List as of 03/21/2015      Cardiovascular and Mediastinum   Essential hypertension, benign   Last Assessment & Plan 06/25/2014 Office Visit Written 06/25/2014 10:25 AM by Satira Sark, MD    No change in current antihypertensive regimen. Keep follow-up with Dr. [place] Crane.      Coronary atherosclerosis of native coronary artery   Last Assessment & Plan 06/25/2014 Office Visit Written 06/25/2014 10:24 AM by Satira Sark, MD    Symptomatically stable on medical therapy, negative ischemic workup within the last 3 years. Continue aspirin, ACE inhibitor, and Zocor. ECG today is normal. Follow-up in 6 months.      TIA (transient ischemic attack)     Endocrine   Diabetes mellitus, type 2 ([place])     Genitourinary    Acute on chronic renal insufficiency [facility])   Last Assessment & Plan 01/09/2014 Office Visit Written 01/09/2014  2:25 PM by  Satira Sark, MD    ACE inhibitor dose was reduced and he was taken off HCTZ. Also plans to stop metformin for now and followup with Dr. [place] Crane. He will need a BMET for his next visit in the next 3 weeks.      Chronic kidney disease, stage III (moderate)     Other   Dyspnea   Last Assessment & Plan 12/26/2013 Office Visit Written 12/26/2013  1:17 PM by Lendon Colonel, NP    Chronic for him with bilateral crackles noted. Will have CXR completed to evaluate. May consider repeating echo on next visit.  Echo was last completed in 2013 with normal EF 55%-60%  In the setting of dyspnea.       Hyperlipidemia   Last Assessment & Plan 06/25/2014 Office Visit Written 06/25/2014 10:25 AM by Satira Sark, MD    Continues on Zocor, follows with Dr. [place] Crane for lab work.      Tobacco abuse   Anemia   Last Assessment & Plan 09/07/2011 Office Visit Written 09/07/2011  4:58 PM by Yehuda Savannah, MD    Mild to moderate anemia, not likely resulting in any symptoms.  Initial testing will be performed. If iron studies and stool Hemoccults are unremarkable, Dr. [place] Crane may wish to refer Mr. Mcbane to a Hematologist.      Leg pain, bilateral   Last Assessment & Plan 01/09/2014 Office Visit Written 01/09/2014  2:24 PM by Satira Sark, MD    At this point our plan is to discontinue Zocor for the next few weeks to see if he notices any improvement. He should call back to let us know. We will also obtain lower extremity ABIs and arterial Dopplers to exclude arterial insufficiency and claudication.      Symptomatic anemia   Fecal occult blood test positive   Occult GI bleeding       Imaging: Dg Chest 2 View  02/22/2015  CLINICAL DATA:  One month history shortness of breath and chest pain EXAM: CHEST  2 VIEW COMPARISON:  Chest radiograph December 28, 2013; chest CT January 12, 2014 FINDINGS:  There is scarring in the left lower lobe region. There is no new opacity in the lungs. No frank edema or consolidation. The heart size and pulmonary vascularity are normal. There is atherosclerotic change in the aorta. Patient is status post coronary artery bypass grafting. IMPRESSION: Scarring left lower lobe region.  No edema or consolidation. Electronically Signed   By: Lowella Grip III M.D.   On: 02/22/2015 13:03

## 2015-03-27 ENCOUNTER — Encounter: Payer: Medicare Other | Admitting: Cardiology

## 2015-03-27 ENCOUNTER — Encounter: Payer: Self-pay | Admitting: Cardiology

## 2015-03-27 NOTE — Progress Notes (Signed)
Erroneously encounter. This encounter was created in error - please disregard.

## 2015-03-28 ENCOUNTER — Ambulatory Visit (HOSPITAL_COMMUNITY): Payer: Medicare Other

## 2015-03-28 ENCOUNTER — Ambulatory Visit (HOSPITAL_COMMUNITY): Payer: Medicare Other | Attending: Adult Health

## 2015-03-28 ENCOUNTER — Encounter (HOSPITAL_COMMUNITY): Payer: Medicare Other

## 2015-03-28 ENCOUNTER — Inpatient Hospital Stay (HOSPITAL_COMMUNITY): Admission: RE | Admit: 2015-03-28 | Payer: Medicare Other | Source: Ambulatory Visit

## 2015-04-04 ENCOUNTER — Encounter (HOSPITAL_COMMUNITY): Payer: Self-pay | Admitting: Emergency Medicine

## 2015-04-04 ENCOUNTER — Emergency Department (HOSPITAL_COMMUNITY): Payer: Medicare Other

## 2015-04-04 ENCOUNTER — Observation Stay (HOSPITAL_COMMUNITY)
Admission: EM | Admit: 2015-04-04 | Discharge: 2015-04-05 | Disposition: A | Discharge status: Home / Self Care | Payer: Medicare Other | Attending: Internal Medicine | Admitting: Internal Medicine

## 2015-04-04 DIAGNOSIS — Z79899 Other long term (current) drug therapy: Secondary | ICD-10-CM | POA: Diagnosis not present

## 2015-04-04 DIAGNOSIS — E785 Hyperlipidemia, unspecified: Secondary | ICD-10-CM | POA: Diagnosis present

## 2015-04-04 DIAGNOSIS — Z8673 Personal history of transient ischemic attack (TIA), and cerebral infarction without residual deficits: Secondary | ICD-10-CM | POA: Insufficient documentation

## 2015-04-04 DIAGNOSIS — Z8701 Personal history of pneumonia (recurrent): Secondary | ICD-10-CM | POA: Diagnosis not present

## 2015-04-04 DIAGNOSIS — I10 Essential (primary) hypertension: Secondary | ICD-10-CM | POA: Diagnosis present

## 2015-04-04 DIAGNOSIS — M549 Dorsalgia, unspecified: Secondary | ICD-10-CM | POA: Diagnosis not present

## 2015-04-04 DIAGNOSIS — N400 Enlarged prostate without lower urinary tract symptoms: Secondary | ICD-10-CM | POA: Insufficient documentation

## 2015-04-04 DIAGNOSIS — R531 Weakness: Secondary | ICD-10-CM | POA: Diagnosis present

## 2015-04-04 DIAGNOSIS — E1122 Type 2 diabetes mellitus with diabetic chronic kidney disease: Secondary | ICD-10-CM

## 2015-04-04 DIAGNOSIS — D649 Anemia, unspecified: Principal | ICD-10-CM | POA: Diagnosis present

## 2015-04-04 DIAGNOSIS — I2510 Atherosclerotic heart disease of native coronary artery without angina pectoris: Secondary | ICD-10-CM | POA: Diagnosis not present

## 2015-04-04 DIAGNOSIS — Z951 Presence of aortocoronary bypass graft: Secondary | ICD-10-CM | POA: Diagnosis not present

## 2015-04-04 DIAGNOSIS — Z87891 Personal history of nicotine dependence: Secondary | ICD-10-CM | POA: Diagnosis not present

## 2015-04-04 DIAGNOSIS — E119 Type 2 diabetes mellitus without complications: Secondary | ICD-10-CM

## 2015-04-04 DIAGNOSIS — D509 Iron deficiency anemia, unspecified: Secondary | ICD-10-CM

## 2015-04-04 LAB — GLUCOSE, CAPILLARY
GLUCOSE-CAPILLARY: 104 mg/dL — AB (ref 65–99)
Glucose-Capillary: 88 mg/dL (ref 65–99)

## 2015-04-04 LAB — CBC WITH DIFFERENTIAL/PLATELET
BASOS ABS: 0 10*3/uL (ref 0.0–0.1)
Basophils Relative: 0 %
EOS ABS: 0.1 10*3/uL (ref 0.0–0.7)
EOS PCT: 2 %
HCT: 15.7 % — ABNORMAL LOW (ref 39.0–52.0)
Hemoglobin: 5 g/dL — CL (ref 13.0–17.0)
LYMPHS ABS: 0.6 10*3/uL — AB (ref 0.7–4.0)
Lymphocytes Relative: 8 %
MCH: 29.6 pg (ref 26.0–34.0)
MCHC: 31.8 g/dL (ref 30.0–36.0)
MCV: 92.9 fL (ref 78.0–100.0)
Monocytes Absolute: 0.4 10*3/uL (ref 0.1–1.0)
Monocytes Relative: 6 %
Neutro Abs: 5.8 10*3/uL (ref 1.7–7.7)
Neutrophils Relative %: 84 %
PLATELETS: 220 10*3/uL (ref 150–400)
RBC: 1.69 MIL/uL — AB (ref 4.22–5.81)
RDW: 16.1 % — ABNORMAL HIGH (ref 11.5–15.5)
WBC: 6.9 10*3/uL (ref 4.0–10.5)

## 2015-04-04 LAB — CBC
HCT: 16.7 % — ABNORMAL LOW (ref 39.0–52.0)
HEMOGLOBIN: 5.3 g/dL — AB (ref 13.0–17.0)
MCH: 29.6 pg (ref 26.0–34.0)
MCHC: 31.7 g/dL (ref 30.0–36.0)
MCV: 93.3 fL (ref 78.0–100.0)
Platelets: 277 10*3/uL (ref 150–400)
RBC: 1.79 MIL/uL — AB (ref 4.22–5.81)
RDW: 16.2 % — ABNORMAL HIGH (ref 11.5–15.5)
WBC: 8.4 10*3/uL (ref 4.0–10.5)

## 2015-04-04 LAB — HEPATIC FUNCTION PANEL
ALBUMIN: 2.8 g/dL — AB (ref 3.5–5.0)
ALK PHOS: 60 U/L (ref 38–126)
ALT: 18 U/L (ref 17–63)
AST: 19 U/L (ref 15–41)
BILIRUBIN TOTAL: 0.3 mg/dL (ref 0.3–1.2)
Bilirubin, Direct: 0.2 mg/dL (ref 0.1–0.5)
Indirect Bilirubin: 0.1 mg/dL — ABNORMAL LOW (ref 0.3–0.9)
Total Protein: 6.9 g/dL (ref 6.5–8.1)

## 2015-04-04 LAB — POC OCCULT BLOOD, ED: FECAL OCCULT BLD: NEGATIVE

## 2015-04-04 LAB — URINE MICROSCOPIC-ADD ON

## 2015-04-04 LAB — BASIC METABOLIC PANEL
ANION GAP: 9 (ref 5–15)
BUN: 26 mg/dL — ABNORMAL HIGH (ref 6–20)
CALCIUM: 8.6 mg/dL — AB (ref 8.9–10.3)
CO2: 24 mmol/L (ref 22–32)
Chloride: 99 mmol/L — ABNORMAL LOW (ref 101–111)
Creatinine, Ser: 1.95 mg/dL — ABNORMAL HIGH (ref 0.61–1.24)
GFR, EST AFRICAN AMERICAN: 35 mL/min — AB (ref >60)
GFR, EST NON AFRICAN AMERICAN: 30 mL/min — AB (ref >60)
Glucose, Bld: 133 mg/dL — ABNORMAL HIGH (ref 65–99)
Potassium: 5 mmol/L (ref 3.5–5.1)
Sodium: 132 mmol/L — ABNORMAL LOW (ref 135–145)

## 2015-04-04 LAB — URINALYSIS, ROUTINE W REFLEX MICROSCOPIC
Glucose, UA: NEGATIVE mg/dL
Ketones, ur: NEGATIVE mg/dL
LEUKOCYTES UA: NEGATIVE
NITRITE: NEGATIVE
PROTEIN: NEGATIVE mg/dL
Specific Gravity, Urine: 1.02 (ref 1.005–1.030)
UROBILINOGEN UA: 0.2 mg/dL (ref 0.0–1.0)
pH: 5.5 (ref 5.0–8.0)

## 2015-04-04 LAB — PREPARE RBC (CROSSMATCH)

## 2015-04-04 LAB — FERRITIN: Ferritin: 177 ng/mL (ref 24–336)

## 2015-04-04 LAB — RETICULOCYTES
RBC.: 1.69 MIL/uL — ABNORMAL LOW (ref 4.22–5.81)
Retic Count, Absolute: 35.5 10*3/uL (ref 19.0–186.0)
Retic Ct Pct: 2.1 % (ref 0.4–3.1)

## 2015-04-04 LAB — TROPONIN I

## 2015-04-04 LAB — IRON: Iron: 9 ug/dL — ABNORMAL LOW (ref 45–182)

## 2015-04-04 LAB — VITAMIN B12: Vitamin B-12: 1755 pg/mL — ABNORMAL HIGH (ref 180–914)

## 2015-04-04 MED ORDER — CYCLOBENZAPRINE HCL 10 MG PO TABS: 5.0000 mg | ORAL_TABLET | Freq: Three times a day (TID) | ORAL | Status: DC | PRN

## 2015-04-04 MED ORDER — ALBUTEROL SULFATE (2.5 MG/3ML) 0.083% IN NEBU: 2.5000 mg | INHALATION_SOLUTION | Freq: Four times a day (QID) | RESPIRATORY_TRACT | Status: DC | PRN

## 2015-04-04 MED ORDER — SODIUM CHLORIDE 0.9 % IV SOLN: Freq: Once | INTRAVENOUS | Status: DC

## 2015-04-04 MED ORDER — MORPHINE SULFATE (PF) 2 MG/ML IV SOLN
2.0000 mg | INTRAVENOUS | Status: DC | PRN
  Administered 2015-04-05: 2 mg via INTRAVENOUS
  Filled 2015-04-04: qty 1

## 2015-04-04 MED ORDER — CLONAZEPAM 0.5 MG PO TABS
1.0000 mg | ORAL_TABLET | Freq: Every day | ORAL | Status: DC
  Administered 2015-04-04: 1 mg via ORAL
  Filled 2015-04-04: qty 2

## 2015-04-04 MED ORDER — ACETAMINOPHEN 650 MG RE SUPP: 650.0000 mg | Freq: Four times a day (QID) | RECTAL | Status: DC | PRN

## 2015-04-04 MED ORDER — VITAMIN B-12 1000 MCG PO TABS
1000.0000 ug | ORAL_TABLET | Freq: Every day | ORAL | Status: DC
  Administered 2015-04-04 – 2015-04-05 (×2): 1000 ug via ORAL
  Filled 2015-04-04 (×2): qty 1

## 2015-04-04 MED ORDER — TAMSULOSIN HCL 0.4 MG PO CAPS
0.4000 mg | ORAL_CAPSULE | Freq: Every day | ORAL | Status: DC
  Administered 2015-04-04 – 2015-04-05 (×2): 0.4 mg via ORAL
  Filled 2015-04-04 (×2): qty 1

## 2015-04-04 MED ORDER — SODIUM CHLORIDE 0.9 % IV SOLN
INTRAVENOUS | Status: DC
Start: 2015-04-04 — End: 2015-04-05
  Administered 2015-04-05: 02:00:00 via INTRAVENOUS

## 2015-04-04 MED ORDER — ALBUTEROL SULFATE HFA 108 (90 BASE) MCG/ACT IN AERS
2.0000 | INHALATION_SPRAY | Freq: Four times a day (QID) | RESPIRATORY_TRACT | Status: DC | PRN
  Filled 2015-04-04: qty 6.7

## 2015-04-04 MED ORDER — SODIUM CHLORIDE 0.9 % IV BOLUS (SEPSIS)
250.0000 mL | Freq: Once | INTRAVENOUS | Status: AC
  Administered 2015-04-04: 250 mL via INTRAVENOUS

## 2015-04-04 MED ORDER — INSULIN ASPART 100 UNIT/ML ~~LOC~~ SOLN: 0.0000 [IU] | Freq: Three times a day (TID) | SUBCUTANEOUS | Status: DC

## 2015-04-04 MED ORDER — DUTASTERIDE 0.5 MG PO CAPS
0.5000 mg | ORAL_CAPSULE | Freq: Every day | ORAL | Status: DC
  Administered 2015-04-04 – 2015-04-05 (×2): 0.5 mg via ORAL
  Filled 2015-04-04 (×5): qty 1

## 2015-04-04 MED ORDER — PANTOPRAZOLE SODIUM 40 MG PO TBEC
40.0000 mg | DELAYED_RELEASE_TABLET | Freq: Every day | ORAL | Status: DC
  Administered 2015-04-05: 40 mg via ORAL
  Filled 2015-04-04: qty 1

## 2015-04-04 MED ORDER — ONDANSETRON HCL 4 MG PO TABS: 4.0000 mg | ORAL_TABLET | Freq: Four times a day (QID) | ORAL | Status: DC | PRN

## 2015-04-04 MED ORDER — INSULIN ASPART 100 UNIT/ML ~~LOC~~ SOLN: 0.0000 [IU] | Freq: Every day | SUBCUTANEOUS | Status: DC

## 2015-04-04 MED ORDER — ONDANSETRON HCL 4 MG/2ML IJ SOLN: 4.0000 mg | Freq: Four times a day (QID) | INTRAMUSCULAR | Status: DC | PRN

## 2015-04-04 MED ORDER — ACETAMINOPHEN 325 MG PO TABS
650.0000 mg | ORAL_TABLET | Freq: Four times a day (QID) | ORAL | Status: DC | PRN
  Administered 2015-04-04: 650 mg via ORAL
  Filled 2015-04-04: qty 2

## 2015-04-04 NOTE — ED Notes (Signed)
Pt reports generalized weakness for past few weeks.  Says fell approx  1 week ago and has had pain in left lower back and hip.  Also reports some SOB and swelling in lower extremities.

## 2015-04-04 NOTE — ED Notes (Signed)
Report given to Audrea Muscat for room 311.

## 2015-04-04 NOTE — H&P (Addendum)
[facility] History and Physical  [name] [name] [id number] DOB: 11-15-1930 DOA: 04/04/2015  Referring physician: Emergency Department PCP: [name], MD  Specialists:   Chief Complaint: Fall  HPI: ASTIN [name] [name] is a 81 y.o. male with a hx of DM2, lower GI bleeding thought to be secondary to bleeding hemorrhoids, HTN, HLD who presents to the ED with increasing weakness. In the ED, pt was noted to have hgb of 5.3, repeat of 5.0. Two units of PRBC's ordered. Pt's stool were heme neg. Pt denied abd pain or n/v. Hospitalist consulted for consideration for admission.  Of note, patient was seen by GI in 10/16 for lower GI bleeding s/p colonoscopy findings of rectal bleeding from small internal hemorrhoids and moderate external hemorrhoids. Colonoscopy was incomplete secondary to severely redundant L colon. EGD notable for gastritis and duodenitis. Patient was recommended to follow up at [facility] for colonoscopy with fluoroscopy and overtube, however pt declined to keep that appointment.  Review of Systems:  Review of Systems  Constitutional: Positive for malaise/fatigue. Negative for fever and chills.  HENT: Negative for ear pain, hearing loss and tinnitus.   Eyes: Negative for pain and discharge.  Respiratory: Positive for shortness of breath. Negative for wheezing.   Cardiovascular: Negative for orthopnea and claudication.  Gastrointestinal: Negative for abdominal pain, diarrhea, blood in stool and melena.  Genitourinary: Negative for hematuria and flank pain.  Musculoskeletal: Negative for back pain and joint pain.  Neurological: Positive for weakness. Negative for tremors and loss of consciousness.  Psychiatric/Behavioral: Negative for hallucinations. The patient does not have insomnia.      Past Medical History  Diagnosis Date  . Diabetes mellitus, type 2 ([place])   . Essential hypertension, benign   . Coronary atherosclerosis of native coronary artery 1992    Multivessel s/p  CABG 1992  . Hyperlipidemia   . TIA (transient ischemic attack)   . History of pneumonia   . Benign prostatic hypertrophy   . Chronic anemia    Past Surgical History  Procedure Laterality Date  . Coronary artery bypass graft  1992  . Tonsillectomy    . Hernia repair    . Cataract extraction Bilateral   . Colonoscopy N/A 02/24/2015    INCOMPLETE DUE TO REDUNDANT LEFT COLON  . Esophagogastroduodenoscopy N/A 02/24/2015    ATROPHIC GASTRITIS, DUODENITIS   Social History:  reports that he quit smoking about 20 years ago. His smoking use included Cigarettes. He started smoking about 75 years ago. He quit after 40 years of use. He does not have any smokeless tobacco history on file. He reports that he does not drink alcohol or use illicit drugs.  where does patient live--home, ALF, SNF? and with whom if at home?  Can patient participate in ADLs?  Allergies  Allergen Reactions  . Aspirin Other (See Comments)    unknown    Family History  Problem Relation Age of Onset  . Cancer Father   . Cancer Mother      Prior to Admission medications   Medication Sig Start Date End Date Taking? Authorizing Provider  albuterol (PROVENTIL HFA;VENTOLIN HFA) 108 (90 BASE) MCG/ACT inhaler Inhale 2 puffs into the lungs every 6 (six) hours as needed for wheezing or shortness of breath.   Yes Historical Provider, MD  Cholecalciferol (VITAMIN D PO) Take 1 capsule by mouth daily.   Yes Historical Provider, MD  clonazePAM (KLONOPIN) 0.5 MG tablet Take 1 mg by mouth at bedtime.  01/29/15  Yes Historical Provider, MD  dutasteride (AVODART) 0.5 MG capsule Take 0.5 mg by mouth daily.   Yes Historical Provider, MD  Magnesium 200 MG TABS Take 1 tablet by mouth daily.   Yes Historical Provider, MD  metFORMIN (GLUCOPHAGE) 500 MG tablet Take 500 mg by mouth 2 (two) times daily with a meal.  12/13/13  Yes Historical Provider, MD  Multiple Vitamins-Minerals (ONE-A-DAY 50 PLUS PO) Take 1 tablet by mouth daily.   Yes  Historical Provider, MD  pantoprazole (PROTONIX) 40 MG tablet Take 1 tablet (40 mg total) by mouth daily before breakfast. 02/25/15  Yes Orvan Falconer, MD  quinapril (ACCUPRIL) 20 MG tablet take 1 tablet by mouth once daily 11/28/14  Yes Satira Sark, MD  Tamsulosin HCl (FLOMAX) 0.4 MG CAPS 0.4 mg daily.  07/18/11  Yes Historical Provider, MD  vitamin B-12 (CYANOCOBALAMIN) 1000 MCG tablet Take 1,000 mcg by mouth daily.   Yes Historical Provider, MD   Physical Exam: Filed Vitals:   04/04/15 1200 04/04/15 1230 04/04/15 1315 04/04/15 1330  BP: 111/50 112/54 118/61 118/63  Pulse: 94 90 92 91  Temp:      TempSrc:      Resp: 20 17 13 14   Height:      Weight:      SpO2: 96% 98% 96% 96%     General:  Awake, in nad  Eyes: PERRL B  ENT: membranes moist, dentition fair  Neck: trachea midline, neck supple  Cardiovascular: regular, s1, s2  Respiratory: normal resp effort, no wheezing  Abdomen: soft,non distended  Skin: normal skin turgor, no abnormal skin lesions seen  Musculoskeletal: perfused, no clubbing  Psychiatric: mood/affect normal// no auditory/visual hallucinations  Neurologic: cn2-12 grossly intact, strength/sensation intact  Labs on Admission:  Basic Metabolic Panel:  Recent Labs Lab 04/04/15 1135  NA 132*  K 5.0  CL 99*  CO2 24  GLUCOSE 133*  BUN 26*  CREATININE 1.95*  CALCIUM 8.6*   Liver Function Tests: No results for input(s): AST, ALT, ALKPHOS, BILITOT, PROT, ALBUMIN in the last 168 hours. No results for input(s): LIPASE, AMYLASE in the last 168 hours. No results for input(s): AMMONIA in the last 168 hours. CBC:  Recent Labs Lab 04/04/15 1135 04/04/15 1159  WBC 8.4 6.9  NEUTROABS  --  5.8  HGB 5.3* 5.0*  HCT 16.7* 15.7*  MCV 93.3 92.9  PLT 277 220   Cardiac Enzymes:  Recent Labs Lab 04/04/15 1159  TROPONINI <0.03    BNP (last 3 results)  Recent Labs  02/22/15 1446  BNP 84.0    ProBNP (last 3 results) No results for input(s):  PROBNP in the last 8760 hours.  CBG: No results for input(s): GLUCAP in the last 168 hours.  Radiological Exams on Admission: Dg Lumbar Spine Complete  04/04/2015  CLINICAL DATA:  Fall 1 week prior.  Low back pain. EXAM: LUMBAR SPINE - COMPLETE 4+ VIEW COMPARISON:  None. FINDINGS: This report assumes 5 non rib-bearing lumbar vertebrae. There is a very mild anterior L1 vertebral body compression deformity of indeterminate chronicity, favor chronic. Remaining lumbar vertebral body heights are preserved. Mild degenerative disc disease at L2-3. Mild spondylosis throughout the remaining lumbar spine. No spondylolisthesis. Mild facet arthropathy. No appreciable foraminal stenosis. No aggressive appearing focal osseous lesions. IMPRESSION: 1. Very mild anterior L1 vertebral compression deformity of indeterminate chronicity, favor chronic. 2. Mild degenerative disc disease and facet arthropathy in the lumbar spine as described. Electronically Signed   By: Ilona Sorrel M.D.   On: 04/04/2015 13:19  Dg Chest Portable 1 View  04/04/2015  CLINICAL DATA:  Shortness of breath.  Fall 1 week ago. EXAM: PORTABLE CHEST - 1 VIEW COMPARISON:  Two-view chest x-ray 02/22/2015. FINDINGS: The heart size is exaggerated by low lung volumes. Emphysematous changes are again noted. Median sternotomy for CABG is evident. The visualized soft tissues and bony thorax are unremarkable. IMPRESSION: No acute cardiopulmonary disease or significant interval change. Emphysema. Electronically Signed   By: San Morelle M.D.   On: 04/04/2015 11:42   Dg Hips Bilat With Pelvis 3-4 Views  04/04/2015  CLINICAL DATA:  [place] in bathroom 1 week ago, LEFT hip and low back pain, initial encounter, personal history diabetes mellitus, hypertension, TIA, coronary artery disease EXAM: DG HIP (WITH OR WITHOUT PELVIS) 3-4V BILAT COMPARISON:  None. FINDINGS: Diffuse osseous demineralization. Symmetric hip and SI joints, appear preserved. No acute  fracture, dislocation, or bone destruction. Scattered atherosclerotic calcifications. Bowel gas is identified extending into the LEFT hemiscrotum compatible with inguinal hernia. IMPRESSION: Osseous demineralization without acute bony abnormalities. LEFT inguinal hernia containing bowel. Electronically Signed   By: Lavonia Dana M.D.   On: 04/04/2015 13:07    Assessment/Plan Principal Problem:   Symptomatic anemia Active Problems:   Diabetes mellitus, type 2 ([facility])   Essential hypertension, benign   Coronary atherosclerosis of native coronary artery   Hyperlipidemia   Anemia   1. Symptomatic anemia, noromocytic 1. Hgb of 5.0 on admit. Two units of PRBC's ordered, pending 2. Stools heme neg in ED 3. Will check iron studies, [id number], folic acid, haptoglobin, LFT's 4. Will keep with clear liquids for now 5. If signs of GI bleeding, would then formally consult GI 6. Admit to med-tele 2. DM2 1. Will cont on SSI coverage 2. Hold metformin given Cr of 1.9 3. HTN 1. BP stable and controlled 2. Cont monitor 4. CAD 1. Stable 2. No chest pain 5. HLD 1. Not on statin prior to admit 6. ARF 1. Cr up to 1.9 (baseline around 1.3) 2. Suspect secondary to anemia. Cont on IVF 3. Follow renal function 4. Hold ACEI for now and avoid home metformin 7. DVT prophylaxis 1. SCD's  Code Status: Full Family Communication: Pt in room, family at bedside Disposition Plan: Admit to [place], [place]pah Melter [facility] Pager [phone]  If 7PM-7AM, please contact night-coverage [email or web address] Password [email or web address] 04/04/2015, 2:13 PM

## 2015-04-04 NOTE — ED Notes (Addendum)
CRITICAL VALUE ALERT  Critical value received:  hgb-5.3  Date of notification:  04/04/15  Time of notification:  [id number] Critical value read back:Yes.    Nurse who received alert:  t Sable Knoles rn  MD notified (1st page):  Tammy Tripplett PA  Time of first page:    MD notified (2nd page):  Time of second page:  Responding MD:    Time MD responded:

## 2015-04-04 NOTE — ED Provider Notes (Signed)
CSN: [id number]     Arrival date & time 04/04/15  1048 History   First MD Initiated Contact with Patient 04/04/15 1107     Chief Complaint  Patient presents with  . Weakness     (Consider location/radiation/quality/duration/timing/severity/associated sxs/prior Treatment) HPI   [name] [name] is a 81 y.o. male who presents to the Emergency Department with family members.  Patient c/o generalized weakness, decreased appetite for few weeks.  He states that he fell in the bathtub approximately one week ago, landing on his left hip and complains of hip and low back pain since the fall.  Family members states that he has remained alert.  He was admitted here in September for anemia and with EGD showed stricture at [place], with moderate erosive gastritis, and duodenitis.  He denies chest pain, shortness of breath, dizziness, bloody stools, vomiting and fever or chills.    Past Medical History  Diagnosis Date  . Diabetes mellitus, type 2 ([place])   . Essential hypertension, benign   . Coronary atherosclerosis of native coronary artery 1992    Multivessel s/p CABG 1992  . Hyperlipidemia   . TIA (transient ischemic attack)   . History of pneumonia   . Benign prostatic hypertrophy   . Chronic anemia    Past Surgical History  Procedure Laterality Date  . Coronary artery bypass graft  1992  . Tonsillectomy    . Hernia repair    . Cataract extraction Bilateral   . Colonoscopy N/A 02/24/2015    INCOMPLETE DUE TO REDUNDANT LEFT COLON  . Esophagogastroduodenoscopy N/A 02/24/2015    ATROPHIC GASTRITIS, DUODENITIS   Family History  Problem Relation Age of Onset  . Cancer Father   . Cancer Mother    Social History  Substance Use Topics  . Smoking status: Former Smoker -- 78 years    Types: Cigarettes    Start date: 05/26/1939    Quit date: 06/20/1994  . Smokeless tobacco: None  . Alcohol Use: No    Review of Systems  Constitutional: Positive for appetite change. Negative for fever and  chills.  HENT: Negative for sore throat and trouble swallowing.   Respiratory: Negative for shortness of breath.   Cardiovascular: Negative for chest pain.  Gastrointestinal: Negative for nausea, vomiting, abdominal pain, diarrhea and blood in stool.  Genitourinary: Negative for dysuria.  Musculoskeletal: Positive for back pain and arthralgias (left hip pain).  Neurological: Positive for weakness. Negative for dizziness, seizures, syncope, facial asymmetry, numbness and headaches.  All other systems reviewed and are negative.     Allergies  Aspirin  Home Medications   Prior to Admission medications   Medication Sig Start Date End Date Taking? Authorizing Provider  Albuterol Sulfate (PROAIR HFA IN) Inhale into the lungs as needed.      Historical Provider, MD  Cholecalciferol (VITAMIN D PO) Take 1 capsule by mouth daily.    Historical Provider, MD  clonazePAM (KLONOPIN) 0.5 MG tablet Take 1 mg by mouth at bedtime.  01/29/15   Historical Provider, MD  finasteride (PROSCAR) 5 MG tablet Take 5 mg by mouth daily.    Historical Provider, MD  metFORMIN (GLUCOPHAGE) 500 MG tablet Take 500 mg by mouth 2 (two) times daily with a meal.  12/13/13   Historical Provider, MD  Multiple Vitamins-Minerals (ONE-A-DAY 50 PLUS PO) Take 1 tablet by mouth daily.    Historical Provider, MD  pantoprazole (PROTONIX) 40 MG tablet Take 1 tablet (40 mg total) by mouth daily before breakfast. 02/25/15  Orvan Falconer, MD  quinapril (ACCUPRIL) 20 MG tablet take 1 tablet by mouth once daily 11/28/14   Satira Sark, MD  Tamsulosin HCl (FLOMAX) 0.4 MG CAPS 0.4 mg daily.  07/18/11   Historical Provider, MD  vitamin B-12 (CYANOCOBALAMIN) 1000 MCG tablet Take 1,000 mcg by mouth daily.    Historical Provider, MD   BP 136/65 mmHg  Pulse 102  Temp(Src) 98.2 F (36.8 C) (Oral)  Resp 20  Ht 5\' 8"  (1.727 m)  Wt 134 lb 6 oz (60.952 kg)  BMI 20.44 kg/m2  SpO2 96% Physical Exam  Constitutional: He is oriented to person, place,  and time. No distress.  Frail appearing, non-toxic  HENT:  Head: Normocephalic.  Mouth/Throat: Oropharynx is clear and moist.  Eyes: EOM are normal. Pupils are equal, round, and reactive to light. No scleral icterus.  Neck: Normal range of motion. Neck supple. No JVD present.  Cardiovascular: Normal rate, regular rhythm and intact distal pulses.   No murmur heard. Pulmonary/Chest: Effort normal and breath sounds normal. No respiratory distress.  Abdominal: Soft. Bowel sounds are normal. He exhibits no distension. There is no tenderness. There is no rebound.  Genitourinary: Guaiac negative stool.  Dark brown heme negative stool.  No palpable rectal masses  Musculoskeletal: He exhibits no edema.  Mild ttp of the left lumbar paraspinal muscles and lateral left hip.  No bony deformities or shortening of the left LLE or external rotation.  DP pulses palpable bilaterally, distal sensation intact  Neurological: He is alert and oriented to person, place, and time.  Skin: Skin is warm.  Psychiatric: He has a normal mood and affect.  Nursing note and vitals reviewed.   ED Course  Procedures (including critical care time) Labs Review Labs Reviewed  BASIC METABOLIC PANEL - Abnormal; Notable for the following:    Sodium 132 (*)    Chloride 99 (*)    Glucose, Bld 133 (*)    BUN 26 (*)    Creatinine, Ser 1.95 (*)    Calcium 8.6 (*)    GFR calc non Af Amer 30 (*)    GFR calc Af Amer 35 (*)    All other components within normal limits  CBC - Abnormal; Notable for the following:    RBC 1.79 (*)    Hemoglobin 5.3 (*)    HCT 16.7 (*)    RDW 16.2 (*)    All other components within normal limits  URINALYSIS, ROUTINE W REFLEX MICROSCOPIC (NOT AT [facility]) - Abnormal; Notable for the following:    Hgb urine dipstick TRACE (*)    Bilirubin Urine SMALL (*)    All other components within normal limits  CBC WITH DIFFERENTIAL/PLATELET - Abnormal; Notable for the following:    RBC 1.69 (*)    Hemoglobin  5.0 (*)    HCT 15.7 (*)    RDW 16.1 (*)    Lymphs Abs 0.6 (*)    All other components within normal limits  URINE MICROSCOPIC-ADD ON  TROPONIN I  POC OCCULT BLOOD, ED  TYPE AND SCREEN  PREPARE RBC (CROSSMATCH)    Imaging Review Dg Lumbar Spine Complete  04/04/2015  CLINICAL DATA:  Fall 1 week prior.  Low back pain. EXAM: LUMBAR SPINE - COMPLETE 4+ VIEW COMPARISON:  None. FINDINGS: This report assumes 5 non rib-bearing lumbar vertebrae. There is a very mild anterior L1 vertebral body compression deformity of indeterminate chronicity, favor chronic. Remaining lumbar vertebral body heights are preserved. Mild degenerative disc disease at L2-3.  Mild spondylosis throughout the remaining lumbar spine. No spondylolisthesis. Mild facet arthropathy. No appreciable foraminal stenosis. No aggressive appearing focal osseous lesions. IMPRESSION: 1. Very mild anterior L1 vertebral compression deformity of indeterminate chronicity, favor chronic. 2. Mild degenerative disc disease and facet arthropathy in the lumbar spine as described. Electronically Signed   By: Ilona Sorrel M.D.   On: 04/04/2015 13:19   Dg Chest Portable 1 View  04/04/2015  CLINICAL DATA:  Shortness of breath.  Fall 1 week ago. EXAM: PORTABLE CHEST - 1 VIEW COMPARISON:  Two-view chest x-ray 02/22/2015. FINDINGS: The heart size is exaggerated by low lung volumes. Emphysematous changes are again noted. Median sternotomy for CABG is evident. The visualized soft tissues and bony thorax are unremarkable. IMPRESSION: No acute cardiopulmonary disease or significant interval change. Emphysema. Electronically Signed   By: San Morelle M.D.   On: 04/04/2015 11:42   Dg Hips Bilat With Pelvis 3-4 Views  04/04/2015  CLINICAL DATA:  [place] in bathroom 1 week ago, LEFT hip and low back pain, initial encounter, personal history diabetes mellitus, hypertension, TIA, coronary artery disease EXAM: DG HIP (WITH OR WITHOUT PELVIS) 3-4V BILAT COMPARISON:   None. FINDINGS: Diffuse osseous demineralization. Symmetric hip and SI joints, appear preserved. No acute fracture, dislocation, or bone destruction. Scattered atherosclerotic calcifications. Bowel gas is identified extending into the LEFT hemiscrotum compatible with inguinal hernia. IMPRESSION: Osseous demineralization without acute bony abnormalities. LEFT inguinal hernia containing bowel. Electronically Signed   By: Lavonia Dana M.D.   On: 04/04/2015 13:07   I have personally reviewed and evaluated these images and lab results as part of my medical decision-making.   EKG Interpretation   Date/Time:  Thursday April 04 2015 11:03:00 EST Ventricular Rate:  97 PR Interval:  168 QRS Duration: 71 QT Interval:  329 QTC Calculation: 418 R Axis:   35 Text Interpretation:  Sinus rhythm Abnormal R-wave progression, early  transition Abnormal T, consider ischemia, lateral leads Baseline wander in  lead(s) V5 Confirmed by COOK  MD, BRIAN ([id number]) on 04/04/2015 11:08:26 AM      MDM   Final diagnoses:  Symptomatic anemia   Pt is frail appearing, non-toxic.  Admitted here 02/22/15 for symptomatic anemia.  Discussed findings with Dr. Lacinda Axon and patient's family members.  Will consult hospitalist for admit.  Pt remains stable at this time.  1320  Blood administration orders initiated and consent forms signed.     [place] Dr. Wyline Copas.  Will admit.  Patient and family agree to plan.    Kem Parkinson, PA-C 04/04/15 1415  Nat Christen, MD 04/05/15 1234

## 2015-04-04 NOTE — ED Notes (Signed)
PT c/o generalized weakness with decreased po intake. PT stated he fell in the bathroom last week and c/o left hip pain and lower back pain.

## 2015-04-05 DIAGNOSIS — E1122 Type 2 diabetes mellitus with diabetic chronic kidney disease: Secondary | ICD-10-CM | POA: Diagnosis not present

## 2015-04-05 DIAGNOSIS — E785 Hyperlipidemia, unspecified: Secondary | ICD-10-CM | POA: Diagnosis not present

## 2015-04-05 DIAGNOSIS — I10 Essential (primary) hypertension: Secondary | ICD-10-CM | POA: Diagnosis not present

## 2015-04-05 DIAGNOSIS — D649 Anemia, unspecified: Secondary | ICD-10-CM | POA: Diagnosis not present

## 2015-04-05 DIAGNOSIS — D509 Iron deficiency anemia, unspecified: Secondary | ICD-10-CM

## 2015-04-05 LAB — IRON AND TIBC
IRON: 25 ug/dL — AB (ref 45–182)
Saturation Ratios: 12 % — ABNORMAL LOW (ref 17.9–39.5)
TIBC: 207 ug/dL — AB (ref 250–450)
UIBC: 182 ug/dL

## 2015-04-05 LAB — COMPREHENSIVE METABOLIC PANEL
ALBUMIN: 2.5 g/dL — AB (ref 3.5–5.0)
ALT: 18 U/L (ref 17–63)
ANION GAP: 8 (ref 5–15)
AST: 15 U/L (ref 15–41)
Alkaline Phosphatase: 54 U/L (ref 38–126)
BUN: 20 mg/dL (ref 6–20)
CALCIUM: 8.4 mg/dL — AB (ref 8.9–10.3)
CO2: 23 mmol/L (ref 22–32)
Chloride: 104 mmol/L (ref 101–111)
Creatinine, Ser: 1.59 mg/dL — ABNORMAL HIGH (ref 0.61–1.24)
GFR calc non Af Amer: 39 mL/min — ABNORMAL LOW (ref >60)
GFR, EST AFRICAN AMERICAN: 45 mL/min — AB (ref >60)
GLUCOSE: 103 mg/dL — AB (ref 65–99)
POTASSIUM: 5.1 mmol/L (ref 3.5–5.1)
Sodium: 135 mmol/L (ref 135–145)
TOTAL PROTEIN: 6 g/dL — AB (ref 6.5–8.1)
Total Bilirubin: 0.7 mg/dL (ref 0.3–1.2)

## 2015-04-05 LAB — TYPE AND SCREEN
ABO/RH(D): O POS
ANTIBODY SCREEN: NEGATIVE
UNIT DIVISION: 0
UNIT DIVISION: 0

## 2015-04-05 LAB — CBC
HEMATOCRIT: 24.1 % — AB (ref 39.0–52.0)
HEMOGLOBIN: 8 g/dL — AB (ref 13.0–17.0)
MCH: 30.3 pg (ref 26.0–34.0)
MCHC: 33.2 g/dL (ref 30.0–36.0)
MCV: 91.3 fL (ref 78.0–100.0)
Platelets: 260 10*3/uL (ref 150–400)
RBC: 2.64 MIL/uL — AB (ref 4.22–5.81)
RDW: 15.3 % (ref 11.5–15.5)
WBC: 10.2 10*3/uL (ref 4.0–10.5)

## 2015-04-05 LAB — GLUCOSE, CAPILLARY
GLUCOSE-CAPILLARY: 93 mg/dL (ref 65–99)
Glucose-Capillary: 108 mg/dL — ABNORMAL HIGH (ref 65–99)

## 2015-04-05 LAB — HAPTOGLOBIN: Haptoglobin: 423 mg/dL — ABNORMAL HIGH (ref 34–200)

## 2015-04-05 MED ORDER — DOCUSATE SODIUM 100 MG PO CAPS: 100.0000 mg | ORAL_CAPSULE | Freq: Every day | ORAL | Status: DC

## 2015-04-05 MED ORDER — DOCUSATE SODIUM 100 MG PO CAPS
100.0000 mg | ORAL_CAPSULE | Freq: Every day | ORAL | Status: DC
  Administered 2015-04-05: 100 mg via ORAL
  Filled 2015-04-05: qty 1

## 2015-04-05 MED ORDER — FERROUS SULFATE 325 (65 FE) MG PO TABS
325.0000 mg | ORAL_TABLET | Freq: Every day | ORAL | Status: DC
  Administered 2015-04-05: 325 mg via ORAL
  Filled 2015-04-05: qty 1

## 2015-04-05 MED ORDER — SITAGLIPTIN PHOSPHATE 50 MG PO TABS: 50.0000 mg | ORAL_TABLET | Freq: Every day | ORAL | Status: DC

## 2015-04-05 MED ORDER — FERROUS SULFATE 325 (65 FE) MG PO TABS: 325.0000 mg | ORAL_TABLET | Freq: Every day | ORAL | Status: AC

## 2015-04-05 MED ORDER — CYCLOBENZAPRINE HCL 5 MG PO TABS: 5.0000 mg | ORAL_TABLET | Freq: Three times a day (TID) | ORAL | Status: DC | PRN

## 2015-04-05 NOTE — Discharge Summary (Addendum)
Physician Discharge Summary  [name] [name] [id number] DOB: 1930/11/16 DOA: 04/04/2015  PCP: [name], MD  Admit date: 04/04/2015 Discharge date: 04/05/2015  Time spent: 20 minutes  Recommendations for Outpatient Follow-up:  1. Follow up with PCP in 1-2 weeks 2. Please repeat CBC in 1-2 weeks to follow up on hgb 3. Would follow up with colonoscopy with fluoroscopy and overtube at [facility] as originally planned by Dr. Oneida Alar in 10/16 (patient elected to skip this colonoscopy appointment)  Discharge Diagnoses:  Principal Problem:   Symptomatic anemia Active Problems:   Diabetes mellitus, type 2 ([place])   Essential hypertension, benign   Coronary atherosclerosis of native coronary artery   Hyperlipidemia   Anemia  Discharge Condition: Improved  Diet recommendation: Diabetic  Filed Weights   04/04/15 1058 04/04/15 1100 04/04/15 1549  Weight: 61.689 kg (136 lb) 60.952 kg (134 lb 6 oz) 59 kg (130 lb 1.1 oz)    History of present illness:  Please review dictated H and P from 11/10. Briefly, 81 y.o. male with a hx of DM2, lower GI bleeding thought to be secondary to bleeding hemorrhoids, HTN, HLD who presents to the ED with increasing weakness. In the ED, pt was noted to have hgb of 5.3, repeat of 5.0. Two units of PRBC's ordered. Pt's stool were heme neg. Pt denied abd pain or n/v. Hospitalist was consulted for consideration for admission  Hospital Course:  1. Symptomatic iron deficient anemia 1. Hgb of 5.0 on admit. Two units of PRBC's were given with hgb improved from 5.0 to 8.0 2. Stools were heme neg in ED 3. Iron studies were abnormal with an iron level of 9 4. Patient was started on iron supplimentation 5. Patient was advised to follow up with colonoscopy with fluoroscopy and overtube at [facility], which was scheduled one month ago but patient missed this appointment 6. Discussed case with patient's POA, who states she will try to have patient follow up at  [facility] 2. DM2 1. Cont on SSI coverage 2. Held metformin given Cr of 1.9 (GFR of 30) 3. Cr on discharge was improved to 1.5 (GFR of 39) and pt is to continue metformin on d/c 3. HTN 1. BP remained stable and controlled 2. Cont monitor 4. CAD 1. Stable 2. No chest pain 5. HLD 1. Not on statin prior to admit 6. ARF 1. Cr up to 1.9 (baseline around 1.3) 2. Suspect secondary to anemia. Continued on IVF 3. Cr improved to 1.5 overnight after hydration 7. DVT prophylaxis 1. SCD's while admitted  Discharge Exam: Filed Vitals:   04/04/15 2140 04/04/15 2153 04/05/15 0038 04/05/15 0651  BP: 123/68 128/64  105/44  Pulse: 89 74  75  Temp: 97.9 F (36.6 C) 98.5 F (36.9 C)  98.1 F (36.7 C)  TempSrc: Oral Oral  Oral  Resp: 20 20  20   Height:      Weight:      SpO2: 98% 94% 97% 98%    General: Awake, in nad Cardiovascular: regular, s1, s2 Respiratory: normal resp effort, no wheezing  Discharge Instructions     Medication List    TAKE these medications        albuterol 108 (90 BASE) MCG/ACT inhaler  Commonly known as:  PROVENTIL HFA;VENTOLIN HFA  Inhale 2 puffs into the lungs every 6 (six) hours as needed for wheezing or shortness of breath.     clonazePAM 0.5 MG tablet  Commonly known as:  KLONOPIN  Take 1 mg by mouth at bedtime.  cyclobenzaprine 5 MG tablet  Commonly known as:  FLEXERIL  Take 1 tablet (5 mg total) by mouth 3 (three) times daily as needed for muscle spasms.     docusate sodium 100 MG capsule  Commonly known as:  COLACE  Take 1 capsule (100 mg total) by mouth daily.     dutasteride 0.5 MG capsule  Commonly known as:  AVODART  Take 0.5 mg by mouth daily.     ferrous sulfate 325 (65 FE) MG tablet  Take 1 tablet (325 mg total) by mouth daily with breakfast.     Magnesium 200 MG Tabs  Take 1 tablet by mouth daily.     metFORMIN 500 MG tablet  Commonly known as:  GLUCOPHAGE  Take 500 mg by mouth 2 (two) times daily with a meal.      ONE-A-DAY 50 PLUS PO  Take 1 tablet by mouth daily.     pantoprazole 40 MG tablet  Commonly known as:  PROTONIX  Take 1 tablet (40 mg total) by mouth daily before breakfast.     quinapril 20 MG tablet  Commonly known as:  ACCUPRIL  take 1 tablet by mouth once daily     tamsulosin 0.4 MG Caps capsule  Commonly known as:  FLOMAX  0.4 mg daily.     vitamin B-12 1000 MCG tablet  Commonly known as:  CYANOCOBALAMIN  Take 1,000 mcg by mouth daily.     VITAMIN D PO  Take 1 capsule by mouth daily.       Allergies  Allergen Reactions  . Aspirin Other (See Comments)    unknown   Follow-up Information    Follow up with [name], MD. Schedule an appointment as soon as possible for a visit in 1 week.   Specialty:  Internal Medicine   Why:  Hospital follow up   Contact information:   [place] [id number] [phone]       Follow up with Follow up at [place] for coloscopy.       The results of significant diagnostics from this hospitalization (including imaging, microbiology, ancillary and laboratory) are listed below for reference.    Significant Diagnostic Studies: Dg Lumbar Spine Complete  04/04/2015  CLINICAL DATA:  Fall 1 week prior.  Low back pain. EXAM: LUMBAR SPINE - COMPLETE 4+ VIEW COMPARISON:  None. FINDINGS: This report assumes 5 non rib-bearing lumbar vertebrae. There is a very mild anterior L1 vertebral body compression deformity of indeterminate chronicity, favor chronic. Remaining lumbar vertebral body heights are preserved. Mild degenerative disc disease at L2-3. Mild spondylosis throughout the remaining lumbar spine. No spondylolisthesis. Mild facet arthropathy. No appreciable foraminal stenosis. No aggressive appearing focal osseous lesions. IMPRESSION: 1. Very mild anterior L1 vertebral compression deformity of indeterminate chronicity, favor chronic. 2. Mild degenerative disc disease and facet arthropathy in the lumbar spine as described.  Electronically Signed   By: Ilona Sorrel M.D.   On: 04/04/2015 13:19   Dg Chest Portable 1 View  04/04/2015  CLINICAL DATA:  Shortness of breath.  Fall 1 week ago. EXAM: PORTABLE CHEST - 1 VIEW COMPARISON:  Two-view chest x-ray 02/22/2015. FINDINGS: The heart size is exaggerated by low lung volumes. Emphysematous changes are again noted. Median sternotomy for CABG is evident. The visualized soft tissues and bony thorax are unremarkable. IMPRESSION: No acute cardiopulmonary disease or significant interval change. Emphysema. Electronically Signed   By: San Morelle M.D.   On: 04/04/2015 11:42   Dg Hips  Bilat With Pelvis 3-4 Views  04/04/2015  CLINICAL DATA:  [place] in bathroom 1 week ago, LEFT hip and low back pain, initial encounter, personal history diabetes mellitus, hypertension, TIA, coronary artery disease EXAM: DG HIP (WITH OR WITHOUT PELVIS) 3-4V BILAT COMPARISON:  None. FINDINGS: Diffuse osseous demineralization. Symmetric hip and SI joints, appear preserved. No acute fracture, dislocation, or bone destruction. Scattered atherosclerotic calcifications. Bowel gas is identified extending into the LEFT hemiscrotum compatible with inguinal hernia. IMPRESSION: Osseous demineralization without acute bony abnormalities. LEFT inguinal hernia containing bowel. Electronically Signed   By: Lavonia Dana M.D.   On: 04/04/2015 13:07    Microbiology: No results found for this or any previous visit (from the past 240 hour(s)).   Labs: Basic Metabolic Panel:  Recent Labs Lab 04/04/15 1135 04/05/15 0551  NA 132* 135  K 5.0 5.1  CL 99* 104  CO2 24 23  GLUCOSE 133* 103*  BUN 26* 20  CREATININE 1.95* 1.59*  CALCIUM 8.6* 8.4*   Liver Function Tests:  Recent Labs Lab 04/04/15 1120 04/05/15 0551  AST 19 15  ALT 18 18  ALKPHOS 60 54  BILITOT 0.3 0.7  PROT 6.9 6.0*  ALBUMIN 2.8* 2.5*   No results for input(s): LIPASE, AMYLASE in the last 168 hours. No results for input(s): AMMONIA in  the last 168 hours. CBC:  Recent Labs Lab 04/04/15 1135 04/04/15 1159 04/05/15 0551  WBC 8.4 6.9 10.2  NEUTROABS  --  5.8  --   HGB 5.3* 5.0* 8.0*  HCT 16.7* 15.7* 24.1*  MCV 93.3 92.9 91.3  PLT 277 220 260   Cardiac Enzymes:  Recent Labs Lab 04/04/15 1159  TROPONINI <0.03   BNP: BNP (last 3 results)  Recent Labs  02/22/15 1446  BNP 84.0    ProBNP (last 3 results) No results for input(s): PROBNP in the last 8760 hours.  CBG:  Recent Labs Lab 04/04/15 1642 04/04/15 2139 04/05/15 0726 04/05/15 1113  GLUCAP 104* 88 93 108*       Signed:  CHIU, STEPHEN K  [facility] 04/05/2015, 2:53 PM

## 2015-04-05 NOTE — Care Management Note (Signed)
Case Management Note  Patient Details  Name: JODAN BAPTIST MRN: [id number] Date of Birth: June 17, 1930  Subjective/Objective:                    Action/Plan:   Expected Discharge Date:  04/08/15               Expected Discharge Plan:  Home/Self Care  In-House Referral:  NA  Discharge planning Services  CM Consult  Post Acute Care Choice:  NA Choice offered to:  NA  DME Arranged:    DME Agency:     HH Arranged:    [place] Agency:     Status of Service:  Completed, signed off  Medicare Important Message Given:    Date Medicare IM Given:    Medicare IM give by:    Date Additional Medicare IM Given:    Additional Medicare Important Message give by:     If discussed at [place] of Stay Meetings, dates discussed:    Additional Comments: Pt discharged home today. No Cm needs noted. Christinia Gully [place], RN 04/05/2015, 2:44 PM

## 2015-04-05 NOTE — Care Management Obs Status (Signed)
[place] NOTIFICATION   Patient Details  Name: ZENAS MANK MRN: [id number] Date of Birth: 1930-05-31   Medicare Observation Status Notification Given:  Yes    Joylene Draft, RN 04/05/2015, 12:58 PM

## 2015-04-05 NOTE — Progress Notes (Signed)
Pt's IV catheter removed and intact. Pt's IV site clean dry and intact. Discharge instructions, follow up appointments and medications reviewed and discussed with patients' niece. All questions answered and no further questions at this time. Pt's niece verbalized understanding of discharge instructions and medications. Pt in stable condition and in no acute distress at time of discharge. Pt escorted by nurse tech.

## 2015-04-05 NOTE — Evaluation (Signed)
Physical Therapy Evaluation Patient Details Name: [name] [name] MRN: [id number] DOB: 09-24-1930 Today's Date: 04/05/2015   History of Present Illness  Pt is an 81 year old male admitted with anemia.  He has a hx of DM, TIA and HTN.  He lives at home with his wife and is normally independent with ADLs using a cane.  He has had one fall in the shower about 2 weeks ago  Clinical Impression  Pt was seen for evaluation.  He reports feeling very well and is anxious to get home.  Evaluation reveals him to be very close to his functional baseline.  Strength and balance are WNL and he is able to ambulate functional distances with his cane and good stability.    Follow Up Recommendations No PT follow up    Equipment Recommendations  None recommended by PT    Recommendations for Other Services   none    Precautions / Restrictions Precautions Precautions: None Restrictions Weight Bearing Restrictions: No      Mobility  Bed Mobility Overal bed mobility: Modified Independent                Transfers Overall transfer level: Modified independent                  Ambulation/Gait Ambulation/Gait assistance: Modified independent (Device/Increase time) Ambulation Distance (Feet): 220 Feet Assistive device: Straight cane Gait Pattern/deviations: WFL(Within Functional Limits)   Gait velocity interpretation: >2.62 ft/sec, indicative of independent community ambulator General Gait Details: good gait stability with a cane  Stairs            Wheelchair Mobility    Modified Rankin (Stroke Patients Only)       Balance Overall balance assessment: Needs assistance Sitting-balance support: No upper extremity supported;Feet supported Sitting balance-Leahy Scale: Normal     Standing balance support: Single extremity supported Standing balance-Leahy Scale: Good                               Pertinent Vitals/Pain Pain Assessment: No/denies pain    Home  Living Family/patient expects to be discharged to:: Private residence Living Arrangements: Spouse/significant other Available Help at Discharge: Family;Available 24 hours/day Type of Home: House Home Access: Stairs to enter Entrance Stairs-Rails: Right Entrance Stairs-Number of Steps: 4 Home Layout: One level Home Equipment: Walker - 2 wheels;Cane - single point      Prior Function Level of Independence: Independent with assistive device(s)         Comments: has stopped taking a shower due to recent fall and does a sponge bath     Hand Dominance   Dominant Hand: Right    Extremity/Trunk Assessment   Upper Extremity Assessment: Overall WFL for tasks assessed           Lower Extremity Assessment: Overall WFL for tasks assessed      Cervical / Trunk Assessment: Kyphotic  Communication   Communication: No difficulties  Cognition Arousal/Alertness: Awake/alert Behavior During Therapy: WFL for tasks assessed/performed Overall Cognitive Status: Within Functional Limits for tasks assessed                      General Comments      Exercises        Assessment/Plan    PT Assessment Patent does not need any further PT services  PT Diagnosis     PT Problem List    PT Treatment Interventions  PT Goals (Current goals can be found in the Care Plan section) Acute Rehab PT Goals PT Goal Formulation: All assessment and education complete, DC therapy    Frequency     Barriers to discharge        Co-evaluation               End of Session Equipment Utilized During Treatment: Gait belt Activity Tolerance: Patient tolerated treatment well Patient left: in chair;with call bell/phone within reach      Functional Assessment Tool Used: clinical judgement Functional Limitation: Mobility: Walking and moving around Mobility: Walking and Moving Around Current Status [phone]): 0 percent impaired, limited or restricted Mobility: Walking and Moving  Around Goal Status [phone]): 0 percent impaired, limited or restricted Mobility: Walking and Moving Around Discharge Status [phone]): 0 percent impaired, limited or restricted    Time: 1110-1144 PT Time Calculation (min) (ACUTE ONLY): 34 min   Charges:   PT Evaluation $Initial PT Evaluation Tier I: 1 Procedure     PT G Codes:   PT G-Codes **NOT FOR INPATIENT CLASS** Functional Assessment Tool Used: clinical judgement Functional Limitation: Mobility: Walking and moving around Mobility: Walking and Moving Around Current Status [id number]): 0 percent impaired, limited or restricted Mobility: Walking and Moving Around Goal Status [id number]): 0 percent impaired, limited or restricted Mobility: Walking and Moving Around Discharge Status [phone]): 0 percent impaired, limited or restricted    Sable Feil  PT 04/05/2015, 11:57 AM [phone]

## 2015-04-05 NOTE — Care Management Note (Signed)
Case Management Note  Patient Details  Name: [name] [name] MRN: [id number] Date of Birth: 05-11-1931  Subjective/Objective:                  Pt admitted from home with symptomatic anemia. Pt lives with his wife and will return home at discharge. Pt helps take care of his wife. Pt stated he is fairly independent with ADL's and still drives.   Action/Plan: Awaiting PT eval. Will continue to follow for discharge planning needs.  Expected Discharge Date:  04/08/15               Expected Discharge Plan:  Home/Self Care  In-House Referral:  NA  Discharge planning Services  CM Consult  Post Acute Care Choice:  NA Choice offered to:  NA  DME Arranged:    DME Agency:     HH Arranged:    HH Agency:     Status of Service:  Completed, signed off  Medicare Important Message Given:    Date Medicare IM Given:    Medicare IM give by:    Date Additional Medicare IM Given:    Additional Medicare Important Message give by:     If discussed at [place] of Stay Meetings, dates discussed:    Additional Comments:  Joylene Draft, RN 04/05/2015, 9:09 AM

## 2015-04-27 ENCOUNTER — Emergency Department (HOSPITAL_COMMUNITY): Payer: Medicare Other

## 2015-04-27 ENCOUNTER — Encounter (HOSPITAL_COMMUNITY): Payer: Self-pay

## 2015-04-27 ENCOUNTER — Inpatient Hospital Stay (HOSPITAL_COMMUNITY)
Admission: EM | Admit: 2015-04-27 | Discharge: 2015-05-12 | DRG: 329 | Disposition: A | Discharge status: Skilled Nursing Facility | Payer: Medicare Other | Attending: Internal Medicine | Admitting: Internal Medicine

## 2015-04-27 DIAGNOSIS — D500 Iron deficiency anemia secondary to blood loss (chronic): Secondary | ICD-10-CM | POA: Diagnosis present

## 2015-04-27 DIAGNOSIS — D72829 Elevated white blood cell count, unspecified: Secondary | ICD-10-CM

## 2015-04-27 DIAGNOSIS — E1122 Type 2 diabetes mellitus with diabetic chronic kidney disease: Secondary | ICD-10-CM | POA: Diagnosis present

## 2015-04-27 DIAGNOSIS — Z6820 Body mass index (BMI) 20.0-20.9, adult: Secondary | ICD-10-CM

## 2015-04-27 DIAGNOSIS — K922 Gastrointestinal hemorrhage, unspecified: Secondary | ICD-10-CM | POA: Diagnosis not present

## 2015-04-27 DIAGNOSIS — K5900 Constipation, unspecified: Secondary | ICD-10-CM | POA: Diagnosis present

## 2015-04-27 DIAGNOSIS — I10 Essential (primary) hypertension: Secondary | ICD-10-CM | POA: Diagnosis present

## 2015-04-27 DIAGNOSIS — E871 Hypo-osmolality and hyponatremia: Secondary | ICD-10-CM | POA: Diagnosis present

## 2015-04-27 DIAGNOSIS — Z8701 Personal history of pneumonia (recurrent): Secondary | ICD-10-CM

## 2015-04-27 DIAGNOSIS — E785 Hyperlipidemia, unspecified: Secondary | ICD-10-CM | POA: Diagnosis present

## 2015-04-27 DIAGNOSIS — A419 Sepsis, unspecified organism: Secondary | ICD-10-CM | POA: Diagnosis not present

## 2015-04-27 DIAGNOSIS — K4090 Unilateral inguinal hernia, without obstruction or gangrene, not specified as recurrent: Secondary | ICD-10-CM | POA: Diagnosis present

## 2015-04-27 DIAGNOSIS — K5641 Fecal impaction: Secondary | ICD-10-CM | POA: Diagnosis present

## 2015-04-27 DIAGNOSIS — E875 Hyperkalemia: Secondary | ICD-10-CM | POA: Diagnosis present

## 2015-04-27 DIAGNOSIS — N400 Enlarged prostate without lower urinary tract symptoms: Secondary | ICD-10-CM | POA: Diagnosis present

## 2015-04-27 DIAGNOSIS — Z87891 Personal history of nicotine dependence: Secondary | ICD-10-CM

## 2015-04-27 DIAGNOSIS — E43 Unspecified severe protein-calorie malnutrition: Secondary | ICD-10-CM | POA: Diagnosis present

## 2015-04-27 DIAGNOSIS — K5731 Diverticulosis of large intestine without perforation or abscess with bleeding: Secondary | ICD-10-CM | POA: Diagnosis not present

## 2015-04-27 DIAGNOSIS — K644 Residual hemorrhoidal skin tags: Secondary | ICD-10-CM | POA: Diagnosis present

## 2015-04-27 DIAGNOSIS — N179 Acute kidney failure, unspecified: Secondary | ICD-10-CM | POA: Diagnosis present

## 2015-04-27 DIAGNOSIS — Z951 Presence of aortocoronary bypass graft: Secondary | ICD-10-CM | POA: Diagnosis not present

## 2015-04-27 DIAGNOSIS — K5730 Diverticulosis of large intestine without perforation or abscess without bleeding: Secondary | ICD-10-CM | POA: Diagnosis present

## 2015-04-27 DIAGNOSIS — F419 Anxiety disorder, unspecified: Secondary | ICD-10-CM | POA: Diagnosis present

## 2015-04-27 DIAGNOSIS — N289 Disorder of kidney and ureter, unspecified: Secondary | ICD-10-CM

## 2015-04-27 DIAGNOSIS — N183 Chronic kidney disease, stage 3 unspecified: Secondary | ICD-10-CM | POA: Diagnosis present

## 2015-04-27 DIAGNOSIS — R296 Repeated falls: Secondary | ICD-10-CM | POA: Diagnosis present

## 2015-04-27 DIAGNOSIS — M545 Low back pain: Secondary | ICD-10-CM | POA: Diagnosis present

## 2015-04-27 DIAGNOSIS — N401 Enlarged prostate with lower urinary tract symptoms: Secondary | ICD-10-CM | POA: Diagnosis present

## 2015-04-27 DIAGNOSIS — Q430 Meckel's diverticulum (displaced) (hypertrophic): Secondary | ICD-10-CM

## 2015-04-27 DIAGNOSIS — S32049A Unspecified fracture of fourth lumbar vertebra, initial encounter for closed fracture: Secondary | ICD-10-CM | POA: Diagnosis present

## 2015-04-27 DIAGNOSIS — K626 Ulcer of anus and rectum: Secondary | ICD-10-CM | POA: Diagnosis present

## 2015-04-27 DIAGNOSIS — F329 Major depressive disorder, single episode, unspecified: Secondary | ICD-10-CM | POA: Diagnosis present

## 2015-04-27 DIAGNOSIS — I129 Hypertensive chronic kidney disease with stage 1 through stage 4 chronic kidney disease, or unspecified chronic kidney disease: Secondary | ICD-10-CM | POA: Diagnosis present

## 2015-04-27 DIAGNOSIS — W19XXXA Unspecified fall, initial encounter: Secondary | ICD-10-CM | POA: Diagnosis present

## 2015-04-27 DIAGNOSIS — J189 Pneumonia, unspecified organism: Secondary | ICD-10-CM | POA: Diagnosis not present

## 2015-04-27 DIAGNOSIS — Z8673 Personal history of transient ischemic attack (TIA), and cerebral infarction without residual deficits: Secondary | ICD-10-CM

## 2015-04-27 DIAGNOSIS — S32000A Wedge compression fracture of unspecified lumbar vertebra, initial encounter for closed fracture: Secondary | ICD-10-CM

## 2015-04-27 DIAGNOSIS — Z01818 Encounter for other preprocedural examination: Secondary | ICD-10-CM | POA: Diagnosis not present

## 2015-04-27 DIAGNOSIS — Z7189 Other specified counseling: Secondary | ICD-10-CM | POA: Insufficient documentation

## 2015-04-27 DIAGNOSIS — Z01810 Encounter for preprocedural cardiovascular examination: Secondary | ICD-10-CM | POA: Diagnosis not present

## 2015-04-27 DIAGNOSIS — Z66 Do not resuscitate: Secondary | ICD-10-CM | POA: Diagnosis present

## 2015-04-27 DIAGNOSIS — D638 Anemia in other chronic diseases classified elsewhere: Secondary | ICD-10-CM | POA: Diagnosis present

## 2015-04-27 DIAGNOSIS — K629 Disease of anus and rectum, unspecified: Secondary | ICD-10-CM | POA: Diagnosis not present

## 2015-04-27 DIAGNOSIS — I2510 Atherosclerotic heart disease of native coronary artery without angina pectoris: Secondary | ICD-10-CM | POA: Diagnosis present

## 2015-04-27 DIAGNOSIS — Z515 Encounter for palliative care: Secondary | ICD-10-CM | POA: Diagnosis present

## 2015-04-27 DIAGNOSIS — E119 Type 2 diabetes mellitus without complications: Secondary | ICD-10-CM

## 2015-04-27 DIAGNOSIS — Y95 Nosocomial condition: Secondary | ICD-10-CM | POA: Diagnosis not present

## 2015-04-27 DIAGNOSIS — R339 Retention of urine, unspecified: Secondary | ICD-10-CM | POA: Diagnosis not present

## 2015-04-27 DIAGNOSIS — K6389 Other specified diseases of intestine: Secondary | ICD-10-CM | POA: Diagnosis not present

## 2015-04-27 DIAGNOSIS — K4091 Unilateral inguinal hernia, without obstruction or gangrene, recurrent: Secondary | ICD-10-CM | POA: Insufficient documentation

## 2015-04-27 DIAGNOSIS — I25708 Atherosclerosis of coronary artery bypass graft(s), unspecified, with other forms of angina pectoris: Secondary | ICD-10-CM | POA: Diagnosis not present

## 2015-04-27 DIAGNOSIS — R000 Tachycardia, unspecified: Secondary | ICD-10-CM

## 2015-04-27 DIAGNOSIS — E860 Dehydration: Secondary | ICD-10-CM | POA: Diagnosis present

## 2015-04-27 DIAGNOSIS — N189 Chronic kidney disease, unspecified: Secondary | ICD-10-CM

## 2015-04-27 DIAGNOSIS — D509 Iron deficiency anemia, unspecified: Secondary | ICD-10-CM | POA: Diagnosis not present

## 2015-04-27 DIAGNOSIS — D649 Anemia, unspecified: Secondary | ICD-10-CM | POA: Diagnosis not present

## 2015-04-27 DIAGNOSIS — C179 Malignant neoplasm of small intestine, unspecified: Principal | ICD-10-CM | POA: Diagnosis present

## 2015-04-27 DIAGNOSIS — J690 Pneumonitis due to inhalation of food and vomit: Secondary | ICD-10-CM | POA: Diagnosis not present

## 2015-04-27 LAB — GLUCOSE, CAPILLARY
GLUCOSE-CAPILLARY: 180 mg/dL — AB (ref 65–99)
GLUCOSE-CAPILLARY: 95 mg/dL (ref 65–99)

## 2015-04-27 LAB — IRON AND TIBC
Iron: 16 ug/dL — ABNORMAL LOW (ref 45–182)
Saturation Ratios: 6 % — ABNORMAL LOW (ref 17.9–39.5)
TIBC: 251 ug/dL (ref 250–450)
UIBC: 235 ug/dL

## 2015-04-27 LAB — CBC WITH DIFFERENTIAL/PLATELET
Basophils Absolute: 0 10*3/uL (ref 0.0–0.1)
Basophils Relative: 0 %
Eosinophils Absolute: 0.2 10*3/uL (ref 0.0–0.7)
Eosinophils Relative: 1 %
HCT: 20.8 % — ABNORMAL LOW (ref 39.0–52.0)
Hemoglobin: 6.6 g/dL — CL (ref 13.0–17.0)
Lymphocytes Relative: 4 %
Lymphs Abs: 0.6 10*3/uL — ABNORMAL LOW (ref 0.7–4.0)
MCH: 28.3 pg (ref 26.0–34.0)
MCHC: 31.7 g/dL (ref 30.0–36.0)
MCV: 89.3 fL (ref 78.0–100.0)
Monocytes Absolute: 0.6 10*3/uL (ref 0.1–1.0)
Monocytes Relative: 4 %
Neutro Abs: 12.8 10*3/uL — ABNORMAL HIGH (ref 1.7–7.7)
Neutrophils Relative %: 91 %
Platelets: 271 10*3/uL (ref 150–400)
RBC: 2.33 MIL/uL — ABNORMAL LOW (ref 4.22–5.81)
RDW: 16.8 % — ABNORMAL HIGH (ref 11.5–15.5)
WBC: 14.1 10*3/uL — ABNORMAL HIGH (ref 4.0–10.5)

## 2015-04-27 LAB — URINALYSIS, ROUTINE W REFLEX MICROSCOPIC
BILIRUBIN URINE: NEGATIVE
Glucose, UA: NEGATIVE mg/dL
Hgb urine dipstick: NEGATIVE
KETONES UR: NEGATIVE mg/dL
Leukocytes, UA: NEGATIVE
Nitrite: NEGATIVE
PH: 6.5 (ref 5.0–8.0)
Protein, ur: NEGATIVE mg/dL
SPECIFIC GRAVITY, URINE: 1.01 (ref 1.005–1.030)

## 2015-04-27 LAB — BASIC METABOLIC PANEL
Anion gap: 11 (ref 5–15)
BUN: 31 mg/dL — ABNORMAL HIGH (ref 6–20)
CO2: 23 mmol/L (ref 22–32)
Calcium: 8.6 mg/dL — ABNORMAL LOW (ref 8.9–10.3)
Chloride: 96 mmol/L — ABNORMAL LOW (ref 101–111)
Creatinine, Ser: 2.1 mg/dL — ABNORMAL HIGH (ref 0.61–1.24)
GFR calc Af Amer: 32 mL/min — ABNORMAL LOW (ref >60)
GFR calc non Af Amer: 28 mL/min — ABNORMAL LOW (ref >60)
Glucose, Bld: 237 mg/dL — ABNORMAL HIGH (ref 65–99)
Potassium: 5.2 mmol/L — ABNORMAL HIGH (ref 3.5–5.1)
Sodium: 130 mmol/L — ABNORMAL LOW (ref 135–145)

## 2015-04-27 LAB — FERRITIN: Ferritin: 170 ng/mL (ref 24–336)

## 2015-04-27 LAB — OCCULT BLOOD X 1 CARD TO LAB, STOOL: Fecal Occult Bld: POSITIVE — AB

## 2015-04-27 LAB — TSH: TSH: 4.389 u[IU]/mL (ref 0.350–4.500)

## 2015-04-27 LAB — PREPARE RBC (CROSSMATCH)

## 2015-04-27 MED ORDER — INSULIN ASPART 100 UNIT/ML ~~LOC~~ SOLN
0.0000 [IU] | Freq: Three times a day (TID) | SUBCUTANEOUS | Status: DC
  Administered 2015-04-27: 3 [IU] via SUBCUTANEOUS
  Administered 2015-05-01: 2 [IU] via SUBCUTANEOUS
  Administered 2015-05-02: 3 [IU] via SUBCUTANEOUS
  Administered 2015-05-03 – 2015-05-06 (×5): 2 [IU] via SUBCUTANEOUS
  Administered 2015-05-07: 3 [IU] via SUBCUTANEOUS
  Administered 2015-05-09 – 2015-05-10 (×3): 2 [IU] via SUBCUTANEOUS
  Administered 2015-05-11: 3 [IU] via SUBCUTANEOUS
  Administered 2015-05-12: 2 [IU] via SUBCUTANEOUS

## 2015-04-27 MED ORDER — ZOLPIDEM TARTRATE 5 MG PO TABS
5.0000 mg | ORAL_TABLET | Freq: Every evening | ORAL | Status: DC | PRN
  Administered 2015-04-28 – 2015-05-02 (×3): 5 mg via ORAL
  Filled 2015-04-27 (×3): qty 1

## 2015-04-27 MED ORDER — ACETAMINOPHEN 650 MG RE SUPP: 650.0000 mg | Freq: Four times a day (QID) | RECTAL | Status: DC | PRN

## 2015-04-27 MED ORDER — SODIUM CHLORIDE 0.9 % IV SOLN: 10.0000 mL/h | Freq: Once | INTRAVENOUS | Status: AC

## 2015-04-27 MED ORDER — PNEUMOCOCCAL VAC POLYVALENT 25 MCG/0.5ML IJ INJ
0.5000 mL | INJECTION | INTRAMUSCULAR | Status: DC
  Filled 2015-04-27: qty 0.5

## 2015-04-27 MED ORDER — ENSURE ENLIVE PO LIQD
237.0000 mL | Freq: Two times a day (BID) | ORAL | Status: DC
  Administered 2015-05-04 – 2015-05-12 (×12): 237 mL via ORAL

## 2015-04-27 MED ORDER — ONDANSETRON HCL 4 MG/2ML IJ SOLN
4.0000 mg | Freq: Four times a day (QID) | INTRAMUSCULAR | Status: DC | PRN
  Administered 2015-05-03: 4 mg via INTRAVENOUS
  Filled 2015-04-27: qty 2

## 2015-04-27 MED ORDER — ONDANSETRON HCL 4 MG PO TABS: 4.0000 mg | ORAL_TABLET | Freq: Four times a day (QID) | ORAL | Status: DC | PRN

## 2015-04-27 MED ORDER — SODIUM CHLORIDE 0.9 % IV SOLN: INTRAVENOUS | Status: DC

## 2015-04-27 MED ORDER — POLYETHYLENE GLYCOL 3350 17 G PO PACK
17.0000 g | PACK | Freq: Once | ORAL | Status: DC
  Filled 2015-04-27: qty 1

## 2015-04-27 MED ORDER — POLYETHYLENE GLYCOL 3350 17 G PO PACK
17.0000 g | PACK | Freq: Two times a day (BID) | ORAL | Status: DC
  Administered 2015-04-27 – 2015-04-28 (×3): 17 g via ORAL
  Filled 2015-04-27 (×3): qty 1

## 2015-04-27 MED ORDER — SODIUM CHLORIDE 0.9 % IV BOLUS (SEPSIS)
500.0000 mL | Freq: Once | INTRAVENOUS | Status: AC
  Administered 2015-04-27: 500 mL via INTRAVENOUS

## 2015-04-27 MED ORDER — ACETAMINOPHEN 325 MG PO TABS
650.0000 mg | ORAL_TABLET | Freq: Four times a day (QID) | ORAL | Status: DC | PRN
  Administered 2015-05-10: 650 mg via ORAL
  Filled 2015-04-27: qty 2

## 2015-04-27 MED ORDER — HYDROCODONE-ACETAMINOPHEN 5-325 MG PO TABS
1.0000 | ORAL_TABLET | ORAL | Status: DC | PRN
  Administered 2015-04-27: 2 via ORAL
  Administered 2015-04-28 – 2015-04-30 (×2): 1 via ORAL
  Administered 2015-05-01 – 2015-05-02 (×2): 2 via ORAL
  Administered 2015-05-02: 1 via ORAL
  Administered 2015-05-02: 2 via ORAL
  Administered 2015-05-03 (×2): 1 via ORAL
  Administered 2015-05-04: 2 via ORAL
  Administered 2015-05-04: 1 via ORAL
  Administered 2015-05-05 – 2015-05-06 (×4): 2 via ORAL
  Administered 2015-05-09 – 2015-05-12 (×4): 1 via ORAL
  Filled 2015-04-27: qty 2
  Filled 2015-04-27: qty 1
  Filled 2015-04-27: qty 2
  Filled 2015-04-27: qty 1
  Filled 2015-04-27: qty 2
  Filled 2015-04-27 (×2): qty 1
  Filled 2015-04-27: qty 2
  Filled 2015-04-27 (×2): qty 1
  Filled 2015-04-27: qty 2
  Filled 2015-04-27 (×3): qty 1
  Filled 2015-04-27 (×2): qty 2
  Filled 2015-04-27 (×3): qty 1
  Filled 2015-04-27 (×3): qty 2

## 2015-04-27 MED ORDER — SODIUM CHLORIDE 0.9 % IV SOLN
INTRAVENOUS | Status: DC
  Administered 2015-04-27 – 2015-04-29 (×3): via INTRAVENOUS

## 2015-04-27 MED ORDER — INFLUENZA VAC SPLIT QUAD 0.5 ML IM SUSY: 0.5000 mL | PREFILLED_SYRINGE | INTRAMUSCULAR | Status: DC

## 2015-04-27 MED ORDER — FENTANYL CITRATE (PF) 100 MCG/2ML IJ SOLN
50.0000 ug | Freq: Once | INTRAMUSCULAR | Status: AC
  Administered 2015-04-27: 50 ug via INTRAVENOUS
  Filled 2015-04-27: qty 2

## 2015-04-27 MED ORDER — INSULIN ASPART 100 UNIT/ML ~~LOC~~ SOLN: 0.0000 [IU] | Freq: Every day | SUBCUTANEOUS | Status: DC

## 2015-04-27 MED ORDER — SODIUM CHLORIDE 0.9 % IJ SOLN
3.0000 mL | Freq: Two times a day (BID) | INTRAMUSCULAR | Status: DC
  Administered 2015-04-29 – 2015-05-11 (×17): 3 mL via INTRAVENOUS

## 2015-04-27 MED ORDER — MILK AND MOLASSES ENEMA
1.0000 | Freq: Once | RECTAL | Status: AC
  Administered 2015-04-27: 250 mL via RECTAL

## 2015-04-27 NOTE — ED Notes (Signed)
Report given to Janett Billow all questions answered.

## 2015-04-27 NOTE — ED Notes (Signed)
CRITICAL VALUE ALERT  Critical value received:  hgb 6.6  Date of notification:  04/27/15  Time of notification:  [id number]  Critical value read back:Yes.    Nurse who received alert:  Iona Coach  MD notified (1st page):  [place]  Time of first page:  1245   Time MD responded:  1245

## 2015-04-27 NOTE — ED Notes (Signed)
Patient c/o of lower back pain with constipation for two weeks. Per EMS daughter stated patient has passed a small amount of stool but no regular BM. Seen here recently for the same.

## 2015-04-27 NOTE — ED Notes (Signed)
Pt unable to provide urine at this time

## 2015-04-27 NOTE — ED Provider Notes (Signed)
CSN: [id number]     Arrival date & time 04/27/15  1205 History   First MD Initiated Contact with Patient 04/27/15 1206     Chief Complaint  Patient presents with  . Back Pain     (Consider location/radiation/quality/duration/timing/severity/associated sxs/prior Treatment) HPI   81yM with multiple complaints. Constipation. Reports ongoing for about two-three weeks. Daughter reports that did pass a small amount of stool yesterday though. Patient was admitted from 11/10-11/11 with symptomatic iron deficiency anemia. Stools were heme-negative. Fe level of 9. Started on Fe supplementation. Not previously on supplementation. Constipation began shortly after starting. Daughter has been giving Colace concurrently. With regards to the anemia he/family also advised to follow colonoscopy with fluoroscopy and overtube at [facility], which was scheduled previously. Patient/POA have still yet to do this. He was transfused PRBCs during this admission. Patient reports that he felt signifcnatly better for about a week after discharge until began declining again. Just didn't seem to have energy and appetite very poor.   He is additionally complaining of lower back pain. Family report 3 falls that they're aware of since last admission. Increasing back pain since which is worse with any type of movement. Patient says he has fallen because he is "blacking out." They have not actually witnessed the falls though.   Past Medical History  Diagnosis Date  . Diabetes mellitus, type 2 ([place])   . Essential hypertension, benign   . Coronary atherosclerosis of native coronary artery 1992    Multivessel s/p CABG 1992  . Hyperlipidemia   . TIA (transient ischemic attack)   . History of pneumonia   . Benign prostatic hypertrophy   . Chronic anemia    Past Surgical History  Procedure Laterality Date  . Coronary artery bypass graft  1992  . Tonsillectomy    . Hernia repair    . Cataract extraction Bilateral   .  Colonoscopy N/A 02/24/2015    INCOMPLETE DUE TO REDUNDANT LEFT COLON  . Esophagogastroduodenoscopy N/A 02/24/2015    ATROPHIC GASTRITIS, DUODENITIS   Family History  Problem Relation Age of Onset  . Cancer Father   . Cancer Mother    Social History  Substance Use Topics  . Smoking status: Former Smoker -- 7 years    Types: Cigarettes    Start date: 05/26/1939    Quit date: 06/20/1994  . Smokeless tobacco: None  . Alcohol Use: No    Review of Systems  All systems reviewed and negative, other than as noted in HPI.   Allergies  Aspirin  Home Medications   Prior to Admission medications   Medication Sig Start Date End Date Taking? Authorizing Provider  albuterol (PROVENTIL HFA;VENTOLIN HFA) 108 (90 BASE) MCG/ACT inhaler Inhale 2 puffs into the lungs every 6 (six) hours as needed for wheezing or shortness of breath.    Historical Provider, MD  Cholecalciferol (VITAMIN D PO) Take 1 capsule by mouth daily.    Historical Provider, MD  clonazePAM (KLONOPIN) 0.5 MG tablet Take 1 mg by mouth at bedtime.  01/29/15   Historical Provider, MD  cyclobenzaprine (FLEXERIL) 5 MG tablet Take 1 tablet (5 mg total) by mouth 3 (three) times daily as needed for muscle spasms. 04/05/15   Donne Hazel, MD  docusate sodium (COLACE) 100 MG capsule Take 1 capsule (100 mg total) by mouth daily. 04/05/15   Donne Hazel, MD  dutasteride (AVODART) 0.5 MG capsule Take 0.5 mg by mouth daily.    Historical Provider, MD  ferrous sulfate 325 (  65 FE) MG tablet Take 1 tablet (325 mg total) by mouth daily with breakfast. 04/05/15   Donne Hazel, MD  Magnesium 200 MG TABS Take 1 tablet by mouth daily.    Historical Provider, MD  metFORMIN (GLUCOPHAGE) 500 MG tablet Take 500 mg by mouth 2 (two) times daily with a meal.  12/13/13   Historical Provider, MD  Multiple Vitamins-Minerals (ONE-A-DAY 50 PLUS PO) Take 1 tablet by mouth daily.    Historical Provider, MD  pantoprazole (PROTONIX) 40 MG tablet Take 1 tablet (40  mg total) by mouth daily before breakfast. 02/25/15   Orvan Falconer, MD  quinapril (ACCUPRIL) 20 MG tablet take 1 tablet by mouth once daily 11/28/14   Satira Sark, MD  Tamsulosin HCl (FLOMAX) 0.4 MG CAPS 0.4 mg daily.  07/18/11   Historical Provider, MD  vitamin B-12 (CYANOCOBALAMIN) 1000 MCG tablet Take 1,000 mcg by mouth daily.    Historical Provider, MD   BP 105/57 mmHg  Pulse 116  Temp(Src) 97.5 F (36.4 C) (Oral)  Resp 18  SpO2 100% Physical Exam  Constitutional:  Sitting in bed awake. Appears pale and frail but not distressed.  HENT:  Head: Normocephalic and atraumatic.  Eyes: Conjunctivae are normal. Right eye exhibits no discharge. Left eye exhibits no discharge.  Neck: Neck supple.  Cardiovascular: Regular rhythm and normal heart sounds.  Exam reveals no gallop and no friction rub.   No murmur heard. Regular tachycardia  Pulmonary/Chest: Effort normal and breath sounds normal. No respiratory distress.  Abdominal: Soft. He exhibits no distension. There is tenderness.  Minimal tenderness across lower abdomen. No mass appreciated.  Musculoskeletal: He exhibits no edema or tenderness.  Winces/groans in pain when sitting forward in bed. Reports that pain in lower back. nontender to palpation though.   Neurological: He is alert.  Skin: Skin is warm and dry.  Psychiatric: He has a normal mood and affect. His behavior is normal. Thought content normal.  Nursing note and vitals reviewed.   ED Course  Procedures (including critical care time)  CRITICAL CARE Performed by: Virgel Manifold Total critical care time: 35 minutes Critical care time was exclusive of separately billable procedures and treating other patients. Critical care was necessary to treat or prevent imminent or life-threatening deterioration. Critical care was time spent personally by me on the following activities: development of treatment plan with patient and/or surrogate as well as nursing, discussions with  consultants, evaluation of patient's response to treatment, examination of patient, obtaining history from patient or surrogate, ordering and performing treatments and interventions, ordering and review of laboratory studies, ordering and review of radiographic studies, pulse oximetry and re-evaluation of patient's condition. Labs Review Labs Reviewed  CBC WITH DIFFERENTIAL/PLATELET - Abnormal; Notable for the following:    WBC 14.1 (*)    RBC 2.33 (*)    Hemoglobin 6.6 (*)    HCT 20.8 (*)    RDW 16.8 (*)    Neutro Abs 12.8 (*)    Lymphs Abs 0.6 (*)    All other components within normal limits  BASIC METABOLIC PANEL  URINALYSIS, ROUTINE W REFLEX MICROSCOPIC (NOT AT [facility])  OCCULT BLOOD X 1 CARD TO LAB, STOOL  TYPE AND SCREEN    Imaging Review No results found. I have personally reviewed and evaluated these images and lab results as part of my medical decision-making.   EKG Interpretation   Date/Time:  Saturday April 27 2015 12:26:07 EST Ventricular Rate:  111 PR Interval:  168 QRS Duration:  71 QT Interval:  301 QTC Calculation: 409 R Axis:   39 Text Interpretation:  Sinus tachycardia Confirmed by Wilson Singer  MD, Mouhamadou Gittleman  ([id number]) on 04/27/2015 12:54:03 PM      MDM   Final diagnoses:  Symptomatic anemia  Acute on chronic renal insufficiency ([place])    81 year old male with multiple complaints. Suspect that he is significantly anemic again. He is very pale. Has a resting heart rate of approximately 110-115. Reports compliance with iron supplementation. Suspect his constipation is secondary to this. A small amount of stool he is passing is without gross blood per report. It is dark in color which is not unexpected with iron usage but it does not look tarry to them. His back is nontender to palpation but he does react to pain with movement. Imaging of his lumbar spine during last admission showed an age-indeterminate L1 compression fracture. With this recent falls since discharge,  will reimage.  Hemoglobin down again to 6.6. Will transfuse PRBCs. Will check stool for occult blood, although this was negative during last admission. Patient was referred to [facility] for colonoscopy, but I don't how much this would acutely change management if his stools are again consistently Hemoccult negative. Enemas for constipation then better bowel regimen. In terms of him "blacking out" this very well may be secondary to severe anemia as well as some element of dehydration. Cr up to 2.1 from 1.59 on discharge. Recent anorexia. IVF and reassess. His EKG shows sinus tachycardia with normal intervals. Patient is not a very good historian and tries to defer most questioning to his family. Hard to get a good review of systems in terms of symptomatology these events.    Virgel Manifold, MD 04/27/15 1332

## 2015-04-27 NOTE — H&P (Signed)
[facility] History and Physical  TRUC HETLAND [id number] DOB: 08/24/1930 DOA: 04/27/2015  Referring physician: Virgel Manifold, MD PCP: [name], MD   Chief Complaint: Back pain   HPI: [name] [name] is a 81 y.o. male with a hx of anemia, DM type 2, HTN, CAD, HLD, and CKD stage III that presents with multiple complaints. Patient was admitted to this hospital from 11/10-11/11 with symptomatic iron deficiency anemia. He had heme negative stool at that time and a hgb of 5.3, requiring a transfusion of 2U. Upon discharge he was feeling better with a hgb of 8.0 and plans were to follow up at [facility] for a colonoscopy with fluoroscopy and overtube. Patient has not yet followed up. He reports feeling better for about a week after discharge until he began experiencing fatigue, lack of appetite, intermittent nausea, and constipation. Per daughter, his constipation began shortly after he started taking iron supplementation. Patient reports he passed a small amount of dark stool yesterday with very minimal BRB in it. He has been taking Colace without relief. He also complains of lower back pain worse with movement. Per family members, he has had three unwitnessed falls since his last admission. Patient reports his falls are due to "blacking out".  Daughter also reports a gradual weight loss over the last few months and periods of depression. He denies any SOB, CP, fever, chills, headache, or urinary changes.  While in the ED, vitals revealed a BP of 100/46 and a HR of 106. Labs were significant for a Hgb of 6.6, WBC 14.1, Sodium 130,  Creatinine 2.10 and a BUN of 31. EKG was relatively unremarkable. Xray of his lumbar spine revealed an acute body fracture of L4 with mild height loss and possible rectal impaction.  EDP initiated a transfusion of 2U PRBCs. He will be admitted for further management.   Of note, patient was also seen by GI in 02/2015 for lower GI bleeding s/p colonoscopy findings of rectal  bleeding from small internal hemorrhoids and moderate external hemorrhoids. Colonoscopy was incomplete secondary to severely redundant L colon. EGD notable for gastritis and duodenitis. Patient was recommended to follow up at [facility] for colonoscopy with fluoroscopy and overtube at that time as well however he declined to keep that appointment.  Review of Systems:  Constitutional:  Fatigue, syncope, weight loss No night sweats, Fevers, chills   HEENT:  No headaches, Difficulty swallowing,Tooth/dental problems,Sore throat,  No sneezing, itching, ear ache, nasal congestion, post nasal drip,  Cardio-vascular:  No chest pain, Orthopnea, PND, swelling in lower extremities, anasarca, dizziness, palpitations  GI:  loss of appetite, constipation, intermittent nausea No heartburn, indigestion, abdominal pain, vomiting, diarrhea  Resp:  No shortness of breath with exertion or at rest. No excess mucus, no productive cough, No non-productive cough, No coughing up of blood.No change in color of mucus.No wheezing.No chest wall deformity  Skin:  no rash or lesions.  GU:  no dysuria, change in color of urine, no urgency or frequency. No flank pain.  Musculoskeletal:   back pain   No joint pain or swelling. No decreased range of motion.   Psych:  Depression  No memory loss.   Past Medical History  Diagnosis Date  . Diabetes mellitus, type 2 ([place])   . Essential hypertension, benign   . Coronary atherosclerosis of native coronary artery 1992    Multivessel s/p CABG 1992  . Hyperlipidemia   . TIA (transient ischemic attack)   . History of pneumonia   .  Benign prostatic hypertrophy   . Chronic anemia    Past Surgical History  Procedure Laterality Date  . Coronary artery bypass graft  1992  . Tonsillectomy    . Hernia repair    . Cataract extraction Bilateral   . Colonoscopy N/A 02/24/2015    INCOMPLETE DUE TO REDUNDANT LEFT COLON  . Esophagogastroduodenoscopy N/A 02/24/2015    ATROPHIC  GASTRITIS, DUODENITIS   Social History:  reports that he quit smoking about 20 years ago. His smoking use included Cigarettes. He started smoking about 75 years ago. He quit after 40 years of use. He does not have any smokeless tobacco history on file. He reports that he does not drink alcohol or use illicit drugs.  Allergies  Allergen Reactions  . Aspirin Other (See Comments)    unknown    Family History  Problem Relation Age of Onset  . Cancer Father   . Cancer Mother      Prior to Admission medications   Medication Sig Start Date End Date Taking? Authorizing Provider  acetaminophen (TYLENOL) 650 MG CR tablet Take 650 mg by mouth every 8 (eight) hours as needed for pain.   Yes Historical Provider, MD  albuterol (PROVENTIL HFA;VENTOLIN HFA) 108 (90 BASE) MCG/ACT inhaler Inhale 2 puffs into the lungs every 6 (six) hours as needed for wheezing or shortness of breath.   Yes Historical Provider, MD  Cholecalciferol (VITAMIN D PO) Take 1 capsule by mouth daily.   Yes Historical Provider, MD  clonazePAM (KLONOPIN) 0.5 MG tablet Take 1 mg by mouth at bedtime.  01/29/15  Yes Historical Provider, MD  Docusate Sodium (RA COL-RITE PO) Take 2 capsules by mouth at bedtime.   Yes Historical Provider, MD  ferrous sulfate 325 (65 FE) MG tablet Take 1 tablet (325 mg total) by mouth daily with breakfast. 04/05/15  Yes Donne Hazel, MD  finasteride (PROSCAR) 5 MG tablet Take 1 tablet by mouth daily. 04/09/15  Yes Historical Provider, MD  Magnesium 200 MG TABS Take 1 tablet by mouth daily.   Yes Historical Provider, MD  metFORMIN (GLUCOPHAGE) 500 MG tablet Take 500 mg by mouth 2 (two) times daily with a meal.  12/13/13  Yes Historical Provider, MD  Multiple Vitamins-Minerals (ONE-A-DAY 50 PLUS PO) Take 1 tablet by mouth daily.   Yes Historical Provider, MD  Omega-3 Fatty Acids (FISH OIL) 1200 MG CAPS Take 1 capsule by mouth daily.   Yes Historical Provider, MD  pantoprazole (PROTONIX) 40 MG tablet Take 1  tablet (40 mg total) by mouth daily before breakfast. 02/25/15  Yes Orvan Falconer, MD  quinapril (ACCUPRIL) 20 MG tablet take 1 tablet by mouth once daily 11/28/14  Yes Satira Sark, MD  Tamsulosin HCl (FLOMAX) 0.4 MG CAPS Take 0.4 mg by mouth daily.  07/18/11  Yes Historical Provider, MD  traMADol (ULTRAM) 50 MG tablet Take 1 tablet by mouth every 6 (six) hours as needed for moderate pain.  04/17/15  Yes Historical Provider, MD  vitamin B-12 (CYANOCOBALAMIN) 1000 MCG tablet Take 1,000 mcg by mouth daily.   Yes Historical Provider, MD  cyclobenzaprine (FLEXERIL) 5 MG tablet Take 1 tablet (5 mg total) by mouth 3 (three) times daily as needed for muscle spasms. Patient not taking: Reported on 04/27/2015 04/05/15   Donne Hazel, MD  levofloxacin (LEVAQUIN) 250 MG tablet Take by mouth daily. Starting 04/13/2015, 2 tablets on the first day, then 1 tablet daily for 13 days. 04/13/15   Historical Provider, MD  Physical Exam: Filed Vitals:   04/27/15 1300 04/27/15 1315 04/27/15 1330 04/27/15 1400  BP: 111/56  108/44 100/46  Pulse: 111 105 104   Temp:      TempSrc:      Resp: 19 14 15 30   SpO2: 98% 97% 96%     Wt Readings from Last 3 Encounters:  04/04/15 59 kg (130 lb 1.1 oz)  03/21/15 61.78 kg (136 lb 3.2 oz)  02/22/15 62.279 kg (137 lb 4.8 oz)    General:  Appears calm and comfortable Eyes: PERRL, normal lids, irises & conjunctiva ENT: grossly normal hearing, lips & tongue Neck: no LAD, masses or thyromegaly Cardiovascular: RRR, no m/r/g. No LE edema. Telemetry: SR, no arrhythmias  Respiratory: CTA bilaterally, no w/r/r. Normal respiratory effort. Abdomen: soft, ntnd Skin: no rash or induration seen on limited exam Musculoskeletal: grossly normal tone BUE/BLE Psychiatric: grossly normal mood and affect, speech fluent and appropriate Neurologic: grossly non-focal.          Labs on Admission:  Basic Metabolic Panel:  Recent Labs Lab 04/27/15 1221  NA 130*  K 5.2*  CL 96*  CO2  23  GLUCOSE 237*  BUN 31*  CREATININE 2.10*  CALCIUM 8.6*   CBC:  Recent Labs Lab 04/27/15 1221  WBC 14.1*  NEUTROABS 12.8*  HGB 6.6*  HCT 20.8*  MCV 89.3  PLT 271    BNP (last 3 results)  Recent Labs  02/22/15 1446  BNP 84.0    EKG: Independently reviewed --sinus tachycardia.   Assessment/Plan Active Problems:   Diabetes mellitus, type 2 ([facility])   Essential hypertension, benign   Hyperlipidemia   Symptomatic anemia   Acute renal failure superimposed on stage 3 chronic kidney disease ([facility])   Dehydration   Fecal impaction in rectum ([facility])   Constipation   Falls   Lumbar compression fracture ([facility])   Hyperkalemia   Hyponatremia   1. Symptomatic iron deficiency anemia. Hgb 6.6. Will transfuse 2U PRBCs and monitor closely. Check serum iron and ferritin. Anemia is likely multifactorial due to chronic disease as well as possible chronic GI blood loss. Stool occult blood has been ordered, if positive can consider a GI evaluation. He will likely need definitive evaluation at [facility]. Daughter will make arrangements through Dr. Nona Dell office.  2. AKI superimposed on CKD stage III, likely related to poor oral intake and anemia. Creatinine 2.10. Will continue IVF and monitor closely. Check bladder scan. If there appears to be bladder obstruction, he will need a foley catheter. Hold ACE inhibitor. 3. Dehydration due to poor oral intake. Continue IVF and normal diet as tolerated. 4. Hyponatremia related to volume depletion, will continue IVF and monitor. 5. Hyperkalemia, likely related to dehydration. Should improve with hydration.  6. Acute compression fracture of L4 related to recent falls, provide pain management and PT consult.  7. Fecal impaction, will provide enema and start on a regular bowel regimen.  8. Anorexia,  Family reports he has not been eating or drinking well for the past several months. Weight appears to be declining. With his significant anemia and abdominal  complaints, can consider CT of the abdomen to r/o any large colon lesions prior to discharge.  9. DM type 2, stable. Continue SSI. Hold metformin for now 10. Essential HTN, BP soft. Will hold antihypertensives and monitor. 11. CAD, stable. Denies any CP.  12. Anxiety, continue home meds. 13. BPH, continue flomax and finasteride    Code Status:  Full  DVT Prophylaxis: SCDs Family Communication:  Discussed with patient who understands and has no concerns at this time. Disposition Plan: Admit to medical floor.   Time spent: 55 minutes  [employer]. MD [facility] Pager [phone]  By signing my name below, I, Rosalie Doctor, attest that this documentation has been prepared under the direction and in the presence of [facility]. MD  I, Dr. Kathie Dike, personally performed the services described in this documentaiton. All medical record entries made by the scribe were at my direction and in my presence. I have reviewed the chart and agree that the record reflects my personal performance and is accurate and complete  Kathie Dike, MD, 04/27/2015 3:20 PM    Electronically Signed: Rosalie Doctor, Scribe. 04/27/2015 1:49pm

## 2015-04-28 DIAGNOSIS — D509 Iron deficiency anemia, unspecified: Secondary | ICD-10-CM

## 2015-04-28 DIAGNOSIS — K922 Gastrointestinal hemorrhage, unspecified: Secondary | ICD-10-CM

## 2015-04-28 LAB — TYPE AND SCREEN
ABO/RH(D): O POS
Antibody Screen: NEGATIVE
Unit division: 0
Unit division: 0

## 2015-04-28 LAB — GLUCOSE, CAPILLARY
GLUCOSE-CAPILLARY: 107 mg/dL — AB (ref 65–99)
GLUCOSE-CAPILLARY: 118 mg/dL — AB (ref 65–99)
Glucose-Capillary: 109 mg/dL — ABNORMAL HIGH (ref 65–99)
Glucose-Capillary: 107 mg/dL — ABNORMAL HIGH (ref 65–99)

## 2015-04-28 LAB — COMPREHENSIVE METABOLIC PANEL
ALK PHOS: 53 U/L (ref 38–126)
ALT: 11 U/L — AB (ref 17–63)
AST: 9 U/L — ABNORMAL LOW (ref 15–41)
Albumin: 2.4 g/dL — ABNORMAL LOW (ref 3.5–5.0)
Anion gap: 7 (ref 5–15)
BILIRUBIN TOTAL: 0.8 mg/dL (ref 0.3–1.2)
BUN: 23 mg/dL — ABNORMAL HIGH (ref 6–20)
CALCIUM: 8.2 mg/dL — AB (ref 8.9–10.3)
CO2: 24 mmol/L (ref 22–32)
CREATININE: 1.61 mg/dL — AB (ref 0.61–1.24)
Chloride: 103 mmol/L (ref 101–111)
GFR, EST AFRICAN AMERICAN: 44 mL/min — AB (ref >60)
GFR, EST NON AFRICAN AMERICAN: 38 mL/min — AB (ref >60)
Glucose, Bld: 101 mg/dL — ABNORMAL HIGH (ref 65–99)
Potassium: 4.7 mmol/L (ref 3.5–5.1)
Sodium: 134 mmol/L — ABNORMAL LOW (ref 135–145)
TOTAL PROTEIN: 5.9 g/dL — AB (ref 6.5–8.1)

## 2015-04-28 LAB — CBC
HCT: 26.1 % — ABNORMAL LOW (ref 39.0–52.0)
Hemoglobin: 8.6 g/dL — ABNORMAL LOW (ref 13.0–17.0)
MCH: 29 pg (ref 26.0–34.0)
MCHC: 33 g/dL (ref 30.0–36.0)
MCV: 87.9 fL (ref 78.0–100.0)
PLATELETS: 238 10*3/uL (ref 150–400)
RBC: 2.97 MIL/uL — AB (ref 4.22–5.81)
RDW: 15.4 % (ref 11.5–15.5)
WBC: 11.9 10*3/uL — AB (ref 4.0–10.5)

## 2015-04-28 MED ORDER — INFLUENZA VAC SPLIT QUAD 0.5 ML IM SUSY
0.5000 mL | PREFILLED_SYRINGE | INTRAMUSCULAR | Status: DC
  Filled 2015-04-28: qty 0.5

## 2015-04-28 MED ORDER — BISACODYL 5 MG PO TBEC
10.0000 mg | DELAYED_RELEASE_TABLET | Freq: Once | ORAL | Status: AC
  Administered 2015-04-28: 10 mg via ORAL
  Filled 2015-04-28: qty 2

## 2015-04-28 MED ORDER — MIRTAZAPINE 15 MG PO TABS
15.0000 mg | ORAL_TABLET | Freq: Every day | ORAL | Status: DC
  Administered 2015-04-28 – 2015-05-11 (×14): 15 mg via ORAL
  Filled 2015-04-28 (×14): qty 1

## 2015-04-28 MED ORDER — PNEUMOCOCCAL VAC POLYVALENT 25 MCG/0.5ML IJ INJ
0.5000 mL | INJECTION | INTRAMUSCULAR | Status: DC
  Filled 2015-04-28 (×2): qty 0.5

## 2015-04-28 MED ORDER — POLYETHYLENE GLYCOL 3350 17 G PO PACK
17.0000 g | PACK | Freq: Once | ORAL | Status: AC
  Administered 2015-04-28: 17 g via ORAL
  Filled 2015-04-28: qty 1

## 2015-04-28 MED ORDER — MAGNESIUM CITRATE PO SOLN
1.0000 | Freq: Once | ORAL | Status: AC
  Administered 2015-04-28: 1 via ORAL
  Filled 2015-04-28: qty 296

## 2015-04-28 NOTE — Consult Note (Signed)
Referring Provider: No ref. provider found Primary Care Physician:  [name], MD Primary Gastroenterologist:  Dr.Fields, MD   Reason for Consultation:    Iron deficiency anemia with need for recurrent transfusion.  HPI:   Patient is 81 year old Caucasian male with multiple medical problems who presented to emergency room yesterday with progressive weakness and acute on chronic lower back pain. Plain films revealed compression fraction to L4 felt to be acute and he was also felt to have fecal impaction. He was given milk of molasses enema with good results. He was begun on polyethylene glycol 17 g twice daily. On admission patient was noted to have hemoglobin of 6.6 g and his stool was noted to be heme positive. Patient has received 2 units of PRBCs and his hemoglobin is up to 8.6 g. Patient was also noted to have large residual and therefore Foley's catheter was placed. Patient was admitted this facility on 02/22/2015 for symptomatic anemia. His hemoglobin was 7.5 g. His stool was noted to be heme positive. He was given 2 units of PRBCs. He underwent EGD and colonoscopy by Dr. Oneida Alar. EGD revealed noncritical stricture at GE junction small sliding hiatal hernia and gastroduodenitis. Gastric biopsy revealed chronic gastritis with a trophy and intestinal metaplasia. H. pylori stains were negative. Duodenal biopsy revealed peptic duodenitis but no evidence of celiac disease. Colonoscopy was incomplete because of poor prep and very tortuous colon. He was noted to have sigmoid colon diverticulosis and external hemorrhoids. Dr. Oneida Alar arrange for patient to go to [facility] for colonoscopy. Unfortunately did not keep his appointment. Patient was readmitted on 04/04/2015 following a fall. He was noted to have hemoglobin of 5.3 g and again received 2 units of PRBCs. During this admission his stool was guaiac negative. Patient acknowledges that he has been losing his memory. However he denies abdominal pain melena  or rectal bleeding. He also denies nausea vomiting heartburn or dysphagia. He does not have a good appetite. He believes he has lost 20 pounds this year. Most he ever weighed was 164 pounds several years ago. He lives at home with his wife and their daughter Helene Kelp attends to to his needs and keeps up with his medications and appointments. He has 2 other children who live in [place]. Patient does not drink alcohol. In the past he would drink alcohol socially but not daily. He quit cigarette smoking for 20 years ago. He states he had 13 siblings and they're all disease but he does not know anymore details.   Past Medical History  Diagnosis Date  . Diabetes mellitus, type 2 ([place])   . Essential hypertension, benign   . Coronary atherosclerosis of native coronary artery 1992    Multivessel s/p CABG 1992  . Hyperlipidemia   . TIA (transient ischemic attack)   . History of pneumonia   . Benign prostatic hypertrophy   . Chronic anemia     Past Surgical History  Procedure Laterality Date  . Coronary artery bypass graft  1992  . Tonsillectomy    . Hernia repair    . Cataract extraction Bilateral   . Colonoscopy N/A 02/24/2015    INCOMPLETE DUE TO REDUNDANT LEFT COLON  . Esophagogastroduodenoscopy N/A 02/24/2015    ATROPHIC GASTRITIS, DUODENITIS    Prior to Admission medications   Medication Sig Start Date End Date Taking? Authorizing Provider  acetaminophen (TYLENOL) 650 MG CR tablet Take 650 mg by mouth every 8 (eight) hours as needed for pain.   Yes Historical Provider, MD  albuterol (  PROVENTIL HFA;VENTOLIN HFA) 108 (90 BASE) MCG/ACT inhaler Inhale 2 puffs into the lungs every 6 (six) hours as needed for wheezing or shortness of breath.   Yes Historical Provider, MD  Cholecalciferol (VITAMIN D PO) Take 1 capsule by mouth daily.   Yes Historical Provider, MD  clonazePAM (KLONOPIN) 0.5 MG tablet Take 1 mg by mouth at bedtime.  01/29/15  Yes Historical Provider, MD  Docusate Sodium (RA  COL-RITE PO) Take 2 capsules by mouth at bedtime.   Yes Historical Provider, MD  ferrous sulfate 325 (65 FE) MG tablet Take 1 tablet (325 mg total) by mouth daily with breakfast. 04/05/15  Yes Donne Hazel, MD  finasteride (PROSCAR) 5 MG tablet Take 1 tablet by mouth daily. 04/09/15  Yes Historical Provider, MD  Magnesium 200 MG TABS Take 1 tablet by mouth daily.   Yes Historical Provider, MD  metFORMIN (GLUCOPHAGE) 500 MG tablet Take 500 mg by mouth 2 (two) times daily with a meal.  12/13/13  Yes Historical Provider, MD  Multiple Vitamins-Minerals (ONE-A-DAY 50 PLUS PO) Take 1 tablet by mouth daily.   Yes Historical Provider, MD  Omega-3 Fatty Acids (FISH OIL) 1200 MG CAPS Take 1 capsule by mouth daily.   Yes Historical Provider, MD  pantoprazole (PROTONIX) 40 MG tablet Take 1 tablet (40 mg total) by mouth daily before breakfast. 02/25/15  Yes Orvan Falconer, MD  quinapril (ACCUPRIL) 20 MG tablet take 1 tablet by mouth once daily 11/28/14  Yes Satira Sark, MD  Tamsulosin HCl (FLOMAX) 0.4 MG CAPS Take 0.4 mg by mouth daily.  07/18/11  Yes Historical Provider, MD  traMADol (ULTRAM) 50 MG tablet Take 1 tablet by mouth every 6 (six) hours as needed for moderate pain.  04/17/15  Yes Historical Provider, MD  vitamin B-12 (CYANOCOBALAMIN) 1000 MCG tablet Take 1,000 mcg by mouth daily.   Yes Historical Provider, MD  cyclobenzaprine (FLEXERIL) 5 MG tablet Take 1 tablet (5 mg total) by mouth 3 (three) times daily as needed for muscle spasms. Patient not taking: Reported on 04/27/2015 04/05/15   Donne Hazel, MD  levofloxacin (LEVAQUIN) 250 MG tablet Take by mouth daily. Starting 04/13/2015, 2 tablets on the first day, then 1 tablet daily for 13 days. 04/13/15   Historical Provider, MD    Current Facility-Administered Medications  Medication Dose Route Frequency Provider Last Rate Last Dose  . 0.9 %  sodium chloride infusion   Intravenous Continuous Kathie Dike, MD 100 mL/hr at 04/28/15 1054    .  acetaminophen (TYLENOL) tablet 650 mg  650 mg Oral Q6H PRN Kathie Dike, MD       Or  . acetaminophen (TYLENOL) suppository 650 mg  650 mg Rectal Q6H PRN Kathie Dike, MD      . feeding supplement (ENSURE ENLIVE) (ENSURE ENLIVE) liquid 237 mL  237 mL Oral BID BM Kathie Dike, MD   237 mL at 04/27/15 1615  . HYDROcodone-acetaminophen (NORCO/VICODIN) 5-325 MG per tablet 1-2 tablet  1-2 tablet Oral Q4H PRN Kathie Dike, MD   1 tablet at 04/28/15 1106  . [START ON 04/29/2015] Influenza vac split quadrivalent PF (FLUARIX) injection 0.5 mL  0.5 mL Intramuscular Tomorrow-1000 Kathie Dike, MD      . insulin aspart (novoLOG) injection 0-15 Units  0-15 Units Subcutaneous TID WC Kathie Dike, MD   3 Units at 04/27/15 1742  . insulin aspart (novoLOG) injection 0-5 Units  0-5 Units Subcutaneous QHS Kathie Dike, MD   0 Units at 04/27/15 2200  .  ondansetron (ZOFRAN) tablet 4 mg  4 mg Oral Q6H PRN Kathie Dike, MD       Or  . ondansetron (ZOFRAN) injection 4 mg  4 mg Intravenous Q6H PRN Kathie Dike, MD      . Derrill Memo ON 04/29/2015] pneumococcal 23 valent vaccine (PNU-IMMUNE) injection 0.5 mL  0.5 mL Intramuscular Tomorrow-1000 Kathie Dike, MD      . polyethylene glycol (MIRALAX / GLYCOLAX) packet 17 g  17 g Oral BID Kathie Dike, MD   17 g at 04/28/15 1106  . sodium chloride 0.9 % injection 3 mL  3 mL Intravenous Q12H Kathie Dike, MD   3 mL at 04/27/15 2200  . zolpidem (AMBIEN) tablet 5 mg  5 mg Oral QHS PRN Kathie Dike, MD        Allergies as of 04/27/2015 - Review Complete 04/27/2015  Allergen Reaction Noted  . Aspirin Other (See Comments) 04/04/2015    Family History  Problem Relation Age of Onset  . Cancer Father   . Cancer Mother     Social History   Social History  . Marital Status: Married    Spouse Name: N/A  . Number of Children: N/A  . Years of Education: N/A   Occupational History  . Retired    Social History Main Topics  . Smoking status: Former Smoker  -- 51 years    Types: Cigarettes    Start date: 05/26/1939    Quit date: 06/20/1994  . Smokeless tobacco: Not on file  . Alcohol Use: No  . Drug Use: No  . Sexual Activity: No   Other Topics Concern  . Not on file   Social History Narrative    Review of Systems: See HPI, otherwise normal ROS  Physical Exam: Temp:  [97.8 F (36.6 C)-99.1 F (37.3 C)] 98.3 F (36.8 C) (12/04 0546) Pulse Rate:  [91-122] 91 (12/04 0546) Resp:  [20-30] 20 (12/04 0546) BP: (100-137)/(46-60) 111/50 mmHg (12/04 0546) SpO2:  [98 %-100 %] 100 % (12/04 0546) Weight:  [122 lb 9.2 oz (55.6 kg)] 122 lb 9.2 oz (55.6 kg) (12/03 1453) Last BM Date: 04/28/15  Lab Results:  Recent Labs  04/27/15 1221 04/28/15 0541  WBC 14.1* 11.9*  HGB 6.6* 8.6*  HCT 20.8* 26.1*  PLT 271 238   BMET  Recent Labs  04/27/15 1221 04/28/15 0541  NA 130* 134*  K 5.2* 4.7  CL 96* 103  CO2 23 24  GLUCOSE 237* 101*  BUN 31* 23*  CREATININE 2.10* 1.61*  CALCIUM 8.6* 8.2*   LFT  Recent Labs  04/28/15 0541  PROT 5.9*  ALBUMIN 2.4*  AST 9*  ALT 11*  ALKPHOS 53  BILITOT 0.8    Studies/Results: Dg Lumbar Spine Complete  04/27/2015  CLINICAL DATA:  Low back pain.  Recent falls. EXAM: LUMBAR SPINE - COMPLETE 4+ VIEW COMPARISON:  04/04/2015 FINDINGS: There is an L4 body fracture with superior and inferior endplate compression deformities. Height loss is approximately 25% at maximum. An L1 superior endplate fracture is chronic. No subluxation. Spondylosis without focal or advanced disc narrowing. Extensive aortic atherosclerosis. Large stool volume, especially in the rectum. Osteopenia. IMPRESSION: 1. L4 vertebral body acute fracture with mild height loss. 2. Large stool volume with potential rectal impaction. Electronically Signed   By: Monte Fantasia M.D.   On: 04/27/2015 14:12    Assessment;  Patient is 81 year old Caucasian male who was admitted to Dr. Blythe Stanford service yesterday via emergency room where he  presented with lower  back pain and noted to have acute L4 compression fracture but also found to be anemic with hemoglobin of 6.6 g. He has received 2 units of PRBCs and his hemoglobin has increased by 2 g. Patient was noted to have heme-positive stool. He has history of iron deficiency anemia dating back to April 2013 when three Hemoccults were negative but he was not evaluated until two months ago when he was admitted with hemoglobin of 7.5 g and received 2 units of PRBCs. No bleeding lesion was identified on esophagogastroduodenoscopy and colonoscopy was incomplete due to redundant colon with poor prep. He has not gone to [facility] colonoscopy. Given his presentation it is important to do a complete colonoscopy before small bowel study considered. Right-sided colonic neoplasm needs to be ruled out given anorexia and weight loss although the symptoms could be secondary to [place] impairment and dementia. Since it is difficult for him to keep his appointment at [facility] another colonoscopy can be attempted at this facility by Dr. Gala Romney will be covering for Dr. Oneida Alar tomorrow. Patient is reluctant to undergo prep today. Will talk with patient's daughter Helene Kelp and proceed with prep if she can convince and help her dad take the prep. I have left message on Teresa's cell phone. Renal function is improving with IV fluids.   Recommendations;  Recommend colonoscopy during this hospitalization if patient changes his mind. Will ask Dr. Gala Romney who is covering for Dr. Oneida Alar to do the procedure.   LOS: 1 day   REHMAN,NAJEEB U  04/28/2015, 1:38 PM

## 2015-04-28 NOTE — Progress Notes (Signed)
Patient's condition discuss with his daughter Helene Kelp. She would like for her dad to have colonoscopy during this admission. Patient will not drink PEG solution. Therefore will prep with Dulcolax and mag citrate. He has IV fluids going therefore we don't have to worry about dehydration.

## 2015-04-28 NOTE — Progress Notes (Signed)
[facility] PROGRESS NOTE  [name] [name] [id number] DOB: [birth year] DOA: 04/27/2015 PCP: [name], MD  Assessment/Plan: 1. Symptomatic iron deficiency anemia. Hgb 8.6 following transfusion of 2U PRBCs. Will continue to monitor. Serum iron and ferritin pending. Anemia is likely multifactorial due to chronic disease as well as possible chronic GI blood loss. Stool occult blood is positive, GI was consulted, awaiting further recommendations.  2. AKI superimposed on CKD stage III, likely related to poor oral intake and anemia. Creatinine improved at 1.61. Will continue IVF and monitor closely. Patient found to have 200ccs of urine in his bladder. He was unable to pass his urine so will continue foley catheter for now. Hold ACE inhibitor.  3. Dehydration due to poor oral intake. Continue IVF and normal diet. 4. Hyponatremia related to volume depletion, will continue IVF and monitor. 5. Hyperkalemia, likely related to dehydration. Resolved. 6. Acute compression fracture of L4 related to recent falls, provide pain management and PT consult.  7. Fecal impaction, resolved with enema. Will continue on a regular bowel regimen.  8. Anorexia, Family reports he has not been eating or drinking well for the past several months. Weight appears to be declining. With his significant anemia and abdominal complaints, can consider CT of the abdomen to r/o any large colon lesions prior to discharge. Consider Megace as an appetite stimulant. Depression may also be playing a role and he has been asked to follow up with his PCP for further management.  9. DM type 2, stable. Continue SSI. Hold metformin for now 10. Essential HTN, BP soft. Will hold antihypertensives and monitor. 11. CAD, stable. Denies any CP.  12. Anxiety, continue home meds. 13. BPH, continue flomax and finasteride   Code Status: Full  DVT Prophylaxis: SCDs Family Communication: Discussed with patient who understands and has no  concerns at this time. Disposition Plan: Discharge when improved.   Consultants:  GI  Procedures:  Transfusion of 2U PRBCs 12/3  Antibiotics:    HPI/Subjective: Feels good and has a good appetite. Constipation has resolved.  Lower back is still sore.   Objective: Filed Vitals:   04/27/15 2330 04/28/15 0546  BP: 106/58 111/50  Pulse: 100 91  Temp: 98.2 F (36.8 C) 98.3 F (36.8 C)  Resp: 20 20    Intake/Output Summary (Last 24 hours) at 04/28/15 0756 Last data filed at 04/28/15 0548  Gross per 24 hour  Intake 1641.58 ml  Output   1700 ml  Net -58.42 ml   Filed Weights   04/27/15 1453  Weight: 55.6 kg (122 lb 9.2 oz)    Exam:  General: NAD, looks comfortable.   Cardiovascular: RRR, S1, S2  Respiratory: clear bilaterally, No wheezing, rales or rhonchi Abdomen: soft, non tender, no distention , bowel sounds normal Musculoskeletal: No edema b/l Psych: Flat affect, does not make eye contact. Hypophonia    Data Reviewed: Basic Metabolic Panel:  Recent Labs Lab 04/27/15 1221 04/28/15 0541  NA 130* 134*  K 5.2* 4.7  CL 96* 103  CO2 23 24  GLUCOSE 237* 101*  BUN 31* 23*  CREATININE 2.10* 1.61*  CALCIUM 8.6* 8.2*   Liver Function Tests:  Recent Labs Lab 04/28/15 0541  AST 9*  ALT 11*  ALKPHOS 53  BILITOT 0.8  PROT 5.9*  ALBUMIN 2.4*    CBC:  Recent Labs Lab 04/27/15 1221 04/28/15 0541  WBC 14.1* 11.9*  NEUTROABS 12.8*  --   HGB 6.6* 8.6*  HCT 20.8* 26.1*  MCV 89.3 87.9  PLT 271 238    BNP (last 3 results)  Recent Labs  02/22/15 1446  BNP 84.0     CBG:  Recent Labs Lab 04/27/15 1729 04/27/15 2004  GLUCAP 180* 95      Studies: Dg Lumbar Spine Complete  04/27/2015  CLINICAL DATA:  Low back pain.  Recent falls. EXAM: LUMBAR SPINE - COMPLETE 4+ VIEW COMPARISON:  04/04/2015 FINDINGS: There is an L4 body fracture with superior and inferior endplate compression deformities. Height loss is approximately 25% at maximum. An  L1 superior endplate fracture is chronic. No subluxation. Spondylosis without focal or advanced disc narrowing. Extensive aortic atherosclerosis. Large stool volume, especially in the rectum. Osteopenia. IMPRESSION: 1. L4 vertebral body acute fracture with mild height loss. 2. Large stool volume with potential rectal impaction. Electronically Signed   By: Monte Fantasia M.D.   On: 04/27/2015 14:12    Scheduled Meds: . feeding supplement (ENSURE ENLIVE)  237 mL Oral BID BM  . Influenza vac split quadrivalent PF  0.5 mL Intramuscular Tomorrow-1000  . insulin aspart  0-15 Units Subcutaneous TID WC  . insulin aspart  0-5 Units Subcutaneous QHS  . pneumococcal 23 valent vaccine  0.5 mL Intramuscular Tomorrow-1000  . polyethylene glycol  17 g Oral BID  . sodium chloride  3 mL Intravenous Q12H   Continuous Infusions: . sodium chloride 100 mL/hr at 04/27/15 2123    Active Problems:   Diabetes mellitus, type 2 ([place])   Essential hypertension, benign   Symptomatic anemia   Acute renal failure superimposed on stage 3 chronic kidney disease (HCC)   Dehydration   Fecal impaction in rectum (HCC)   Constipation   Falls   Lumbar compression fracture (H[facility])   Hyperkalemia   Hyponatremia   BPH (benign prostatic hypertrophy)    Time spent: 25  minutes   Manfred Laspina. MD  [facility] Pager [phone]. If 7PM-7AM, please contact night-coverage at [email or web address], password [facility] 04/28/2015, 7:56 AM  LOS: 1 day     By signing my name below, I, Rosalie Doctor, attest that this documentation has been prepared under the direction and in the presence of [facility]. MD Electronically Signed: Rosalie Doctor, Scribe. 04/28/2015 12:52pm  I, Dr. Kathie Dike, personally performed the services described in this documentaiton. All medical record entries made by the scribe were at my direction and in my presence. I have reviewed the chart and agree that the record reflects my personal  performance and is accurate and complete  Kathie Dike, MD, 04/28/2015 1:11 PM

## 2015-04-29 ENCOUNTER — Encounter (HOSPITAL_COMMUNITY)
Admission: EM | Disposition: A | Discharge status: Skilled Nursing Facility | Payer: Self-pay | Source: Home / Self Care | Attending: Internal Medicine

## 2015-04-29 ENCOUNTER — Encounter: Payer: Self-pay | Admitting: Cardiology

## 2015-04-29 ENCOUNTER — Encounter (HOSPITAL_COMMUNITY): Payer: Self-pay | Admitting: *Deleted

## 2015-04-29 ENCOUNTER — Ambulatory Visit: Payer: Medicare Other | Admitting: Cardiology

## 2015-04-29 DIAGNOSIS — K5731 Diverticulosis of large intestine without perforation or abscess with bleeding: Secondary | ICD-10-CM

## 2015-04-29 DIAGNOSIS — E860 Dehydration: Secondary | ICD-10-CM

## 2015-04-29 DIAGNOSIS — R0989 Other specified symptoms and signs involving the circulatory and respiratory systems: Secondary | ICD-10-CM

## 2015-04-29 HISTORY — PX: COLONOSCOPY: SHX5424

## 2015-04-29 LAB — GLUCOSE, CAPILLARY
GLUCOSE-CAPILLARY: 113 mg/dL — AB (ref 65–99)
GLUCOSE-CAPILLARY: 153 mg/dL — AB (ref 65–99)
Glucose-Capillary: 133 mg/dL — ABNORMAL HIGH (ref 65–99)
Glucose-Capillary: 117 mg/dL — ABNORMAL HIGH (ref 65–99)

## 2015-04-29 LAB — BASIC METABOLIC PANEL
ANION GAP: 6 (ref 5–15)
BUN: 15 mg/dL (ref 6–20)
CO2: 24 mmol/L (ref 22–32)
Calcium: 7.9 mg/dL — ABNORMAL LOW (ref 8.9–10.3)
Chloride: 104 mmol/L (ref 101–111)
Creatinine, Ser: 1.23 mg/dL (ref 0.61–1.24)
GFR, EST NON AFRICAN AMERICAN: 53 mL/min — AB (ref >60)
Glucose, Bld: 126 mg/dL — ABNORMAL HIGH (ref 65–99)
POTASSIUM: 4.3 mmol/L (ref 3.5–5.1)
SODIUM: 134 mmol/L — AB (ref 135–145)

## 2015-04-29 LAB — CBC
HEMATOCRIT: 26.2 % — AB (ref 39.0–52.0)
HEMOGLOBIN: 8.5 g/dL — AB (ref 13.0–17.0)
MCH: 28.5 pg (ref 26.0–34.0)
MCHC: 32.4 g/dL (ref 30.0–36.0)
MCV: 87.9 fL (ref 78.0–100.0)
Platelets: 249 10*3/uL (ref 150–400)
RBC: 2.98 MIL/uL — AB (ref 4.22–5.81)
RDW: 15.6 % — ABNORMAL HIGH (ref 11.5–15.5)
WBC: 12.9 10*3/uL — AB (ref 4.0–10.5)

## 2015-04-29 SURGERY — COLONOSCOPY
Anesthesia: Moderate Sedation

## 2015-04-29 MED ORDER — POLYETHYLENE GLYCOL 3350 17 G PO PACK
17.0000 g | PACK | Freq: Every day | ORAL | Status: DC
  Administered 2015-04-29 – 2015-05-02 (×3): 17 g via ORAL
  Filled 2015-04-29 (×3): qty 1

## 2015-04-29 MED ORDER — SODIUM CHLORIDE 0.9 % IV SOLN
INTRAVENOUS | Status: DC
  Administered 2015-04-29: 1000 mL via INTRAVENOUS

## 2015-04-29 MED ORDER — ONDANSETRON HCL 4 MG/2ML IJ SOLN
INTRAMUSCULAR | Status: AC
Start: 2015-04-29 — End: 2015-04-30
  Filled 2015-04-29: qty 2

## 2015-04-29 MED ORDER — BISACODYL 5 MG PO TBEC
10.0000 mg | DELAYED_RELEASE_TABLET | Freq: Once | ORAL | Status: AC
  Administered 2015-04-29: 10 mg via ORAL
  Filled 2015-04-29: qty 2

## 2015-04-29 MED ORDER — ONDANSETRON HCL 4 MG/2ML IJ SOLN
INTRAMUSCULAR | Status: DC | PRN
  Administered 2015-04-29: 4 mg via INTRAVENOUS

## 2015-04-29 MED ORDER — ATROPINE SULFATE 1 MG/ML IJ SOLN
INTRAMUSCULAR | Status: AC
  Filled 2015-04-29: qty 1

## 2015-04-29 MED ORDER — MEPERIDINE HCL 100 MG/ML IJ SOLN
INTRAMUSCULAR | Status: AC
  Filled 2015-04-29: qty 2

## 2015-04-29 MED ORDER — LORAZEPAM 2 MG/ML IJ SOLN
0.5000 mg | Freq: Once | INTRAMUSCULAR | Status: AC
  Administered 2015-04-29: 0.5 mg via INTRAVENOUS
  Filled 2015-04-29: qty 1

## 2015-04-29 MED ORDER — MIDAZOLAM HCL 5 MG/5ML IJ SOLN
INTRAMUSCULAR | Status: DC | PRN
  Administered 2015-04-29 (×2): 1 mg via INTRAVENOUS

## 2015-04-29 MED ORDER — MEPERIDINE HCL 100 MG/ML IJ SOLN
INTRAMUSCULAR | Status: DC | PRN
  Administered 2015-04-29 (×2): 25 mg

## 2015-04-29 MED ORDER — MIDAZOLAM HCL 5 MG/5ML IJ SOLN
INTRAMUSCULAR | Status: AC
  Filled 2015-04-29: qty 10

## 2015-04-29 MED ORDER — STERILE WATER FOR IRRIGATION IR SOLN
Status: DC | PRN
  Administered 2015-04-29: 15:00:00

## 2015-04-29 MED ORDER — ATROPINE SULFATE 1 MG/ML IJ SOLN
INTRAMUSCULAR | Status: DC | PRN
  Administered 2015-04-29: .25 mg via INTRAVENOUS

## 2015-04-29 NOTE — Progress Notes (Signed)
See colonoscopy dictation [id number]. Attempted colonoscopy. Unable to complete. Contrast CT of the abdomen and pelvis ordered for tomorrow. Clear liquid diet for now.

## 2015-04-29 NOTE — Op Note (Signed)
NAME:  [name] [name], [name] [name]                 ACCOUNT NO.:  [id number]  MEDICAL RECORD NO.:  [id number]  LOCATION:  [id number]                          FACILITY:  [facility]  PHYSICIAN:  R. Garfield Cornea, MD [place]:  1930-06-28  DATE OF PROCEDURE:  04/29/2015 DATE OF DISCHARGE:                              OPERATIVE REPORT   PROCEDURE:  Flexible sigmoidoscopy with biopsy.  INDICATIONS FOR PROCEDURE:  81 year old gentleman with iron deficiency anemia, Hemoccult-positive stool, prior colonoscopy attempted and incomplete.  He comes now for second attempted colonoscopy.  Risks, benefits, limitations, alternatives, imponderables have been discussed with the patient's daughter and the patient.  PROCEDURE NOTE:  O2 saturation, blood pressure, pulse, respirations were monitored throughout the entirety of the procedure.  Conscious sedation, Versed 2 mg IV and Demerol 50 mg IV in divided doses, atropine 0.25 mg IV (given prior to the beginning of the procedure given a history of bradycardia previously), Zofran 4 mg IV.  INSTRUMENT:  Pentax video chip colonoscope.  FINDINGS:  Digital rectal exam revealed no abnormalities.  Endoscopic findings, prep was adequate.  Examination of rectal mucosa revealed 2 oval-shaped, somewhat stellate-shaped ulcers in the distal rectum with overlying exudate.  Please see photos.  The remainder of the rectal mucosa appeared normal.  The patient had left-sided diverticula.  I advanced the scope easily well into the descending colon and noted the upstream colon was pinched, apparently extrinsically, could not advance the scope beyond this area as the colon would not open up and the patient was uncomfortable.  This appears to be the same area reached at the prior attempted colonoscopy.  From this level, the scope was withdrawn all previous mucosal surfaces were again seen.  No other abnormalities were observed.  The rectal ulcers were biopsied.  The patient tolerated  the procedure well.  IMPRESSION: 1. Rectal ulcers biopsied. 2. Colonic diverticulosis.  Occlusion of the upstream colonic lumen     possibly from extrinsic compression.  Query, segment of colon     entrapped in a hernia sac precluded complete colonoscopy. 3. No recent CT of the abdomen.  RECOMMENDATIONS:  We will plan a contrast CT of the abdomen and pelvis to define the intraabdominal anatomy further tomorrow.  Clear liquid diet.  Further recommendations to follow.  Follow up on pathology. Findings and recommendations were discussed at length with patient's wife and family members.     Bridgette Habermann, MD [place]     RMR/MEDQ  D:  04/29/2015  T:  04/29/2015  Job:  [id number]

## 2015-04-29 NOTE — Progress Notes (Signed)
Patient is confused this morning.  He is unsure where he is, climbing out of the bed, pulling at tubes and wires. Several attempts have been made to reorient the patient and have been unsuccessful.  Will continue to reorient the patient and ensue the bed alarm stays on.

## 2015-04-29 NOTE — Evaluation (Signed)
Physical Therapy Evaluation Patient Details Name: [name] [name] MRN: [id number] DOB: 1930/06/04 Today's Date: 04/29/2015   History of Present Illness  [name] [name] is a 81 y.o. male with a hx of anemia, DM type 2, HTN, CAD, HLD, and CKD stage III that presents with multiple complaints. Patient was admitted to this hospital from 11/10-11/11 with symptomatic iron deficiency anemia. He had heme negative stool at that time and a hgb of 5.3, requiring a transfusion of 2U. Upon discharge he was feeling better with a hgb of 8.0 and plans were to follow up at [facility] for a colonoscopy with fluoroscopy and overtube. Patient has not yet followed up. He reports feeling better for about a week after discharge until he began experiencing fatigue, lack of appetite, intermittent nausea, and constipation. Per daughter, his constipation began shortly after he started taking iron supplementation. Patient reports he passed a small amount of dark stool yesterday with very minimal BRB in it. He has been taking Colace without relief. He also complains of lower back pain worse with movement. Per family members, he has had three unwitnessed falls since his last admission. Patient reports his falls are due to "blacking out". Daughter also reports a gradual weight loss over the last few months and periods of depression. He denies any SOB, CP, fever, chills, headache, or urinary changes.  Pt is found to have a new L4 compression fx.  He has been using a walker at home due to increased falls.  Clinical Impression    Pt was seen for evaluation.  Wife and daughter present.  Pt is very disoriented this morning but is able to follow directions.  Per family these cognitive problems are resulting from minimal sleep and medications.  He is found to have generalized weakness from baseline.  Gait with walker is stable but with multiple falls I do think it would warrant HHPT at d/c.    Follow Up Recommendations Home health PT     Equipment Recommendations  None recommended by PT    Recommendations for Other Services   none    Precautions / Restrictions Precautions Precautions: Fall Restrictions Weight Bearing Restrictions: No      Mobility  Bed Mobility Overal bed mobility: Modified Independent                Transfers Overall transfer level: Needs assistance Equipment used: Rolling walker (2 wheeled) Transfers: Sit to/from Stand Sit to Stand: Supervision            Ambulation/Gait Ambulation/Gait assistance: Supervision Ambulation Distance (Feet): 130 Feet Assistive device: Rolling walker (2 wheeled) Gait Pattern/deviations: WFL(Within Functional Limits);Trunk flexed;Decreased stride length   Gait velocity interpretation: <1.8 ft/sec, indicative of risk for recurrent falls    Stairs            Wheelchair Mobility    Modified Rankin (Stroke Patients Only)       Balance Overall balance assessment: History of Falls                                           Pertinent Vitals/Pain Pain Assessment: No/denies pain    Home Living Family/patient expects to be discharged to:: Private residence Living Arrangements: Spouse/significant other Available Help at Discharge: Family;Available 24 hours/day Type of Home: House Home Access: Stairs to enter Entrance Stairs-Rails: Right Entrance Stairs-Number of Steps: 4 Home Layout: One level Home Equipment: Gilford Rile -  2 wheels;Cane - single point      Prior Function Level of Independence: Independent with assistive device(s)         Comments: has been using a walker recently due to falls at home     Hand Dominance   Dominant Hand: Right    Extremity/Trunk Assessment               Lower Extremity Assessment: Generalized weakness      Cervical / Trunk Assessment: Kyphotic  Communication   Communication: No difficulties  Cognition Arousal/Alertness: Awake/alert Behavior During Therapy: WFL  for tasks assessed/performed Overall Cognitive Status: Impaired/Different from baseline Area of Impairment: Orientation Orientation Level: Disoriented to;Place;Situation;Time             General Comments: family states that he is normally very alert and oriented with no cognitive deficit    General Comments      Exercises        Assessment/Plan    PT Assessment All further PT needs can be met in the next venue of care  PT Diagnosis Generalized weakness   PT Problem List Decreased strength;Decreased activity tolerance;Decreased mobility;Decreased balance  PT Treatment Interventions     PT Goals (Current goals can be found in the Care Plan section) Acute Rehab PT Goals PT Goal Formulation: All assessment and education complete, DC therapy    Frequency     Barriers to discharge        Co-evaluation               End of Session Equipment Utilized During Treatment: Gait belt Activity Tolerance: Patient tolerated treatment well Patient left: in chair;with call bell/phone within reach;with family/visitor present           Time: 4158-3094 PT Time Calculation (min) (ACUTE ONLY): 28 min   Charges:   PT Evaluation $Initial PT Evaluation Tier I: 1 Procedure     PT G CodesDemetrios Isaacs L  PT 04/29/2015, 10:16 AM [phone]

## 2015-04-29 NOTE — Progress Notes (Signed)
Patients stool is still dark in color this AM will pass to the morning nurse to report to the MD.

## 2015-04-29 NOTE — Progress Notes (Addendum)
Spoke briefly with daughter who states output is clear. Nurse states still dark but remains clear. Will order additional 10 mg Dulcolax now, tap water enema X 2 around noon. Patient scheduled at 1400 this afternoon. Orvil Feil, ANP-BC [facility] Gastroenterology   Addendum at 0930: will add additional dose of Miralax as well. Orvil Feil, ANP-BC [facility] Gastroenterology

## 2015-04-29 NOTE — Progress Notes (Signed)
[facility] PROGRESS NOTE  REG UHLAND [id number] DOB: 1931/05/07 DOA: 04/27/2015 PCP: [name], MD  Assessment/Plan: 1. Symptomatic iron deficiency anemia. Hgb stable following transfusion of 2U PRBCs. Will continue to monitor. Serum iron 16 and ferritin WNL. Anemia is likely multifactorial due to chronic disease as well as possible chronic GI blood loss. Stool occult blood is positive. GI has evaluated and recommends colonoscopy scheduled for today.  2. AKI superimposed on CKD stage III, likely related to poor oral intake and anemia. Resolved with IVF. Discontinue foley catheter today. Continue to hold ACE inhibitor.  3. Dehydration due to poor oral intake. Resolved with IVF and normal diet.  4. Hyponatremia related to volume depletion, will continue IVF and monitor. 5. Hyperkalemia, likely related to dehydration. Resolved. 6. Acute compression fracture of L4 related to recent falls, provide pain management and PT consult.  7. Fecal impaction, resolved with enema. Will continue on a regular bowel regimen.  8. Anorexia, Family reports he has not been eating or drinking well for the past several months. Weight appears to be declining. With his significant anemia and abdominal complaints, can consider CT of the abdomen to r/o any large colon lesions prior to discharge. Depression may also be playing a role and he has been asked to follow up with his PCP for further management. Patient was started on Remeron, which can hopefully help with depression as well as act as an appetite stimulant.  9. DM type 2, stable. Continue SSI. Hold metformin for now. 10. Essential HTN, BP soft. Will hold antihypertensives and monitor. 11. CAD, stable. Denies any CP.  12. Anxiety, continue home meds. 13. BPH, continue flomax and finasteride   Code Status: Full  DVT Prophylaxis: SCDs Family Communication Daughter bedside. Discussed with family who understands and has no concerns at this  time. Disposition Plan: Discharge home with [facility] 12/6.   Consultants:  GI  PT- Discharge home with [facility].  Procedures:  Transfusion of 2U PRBCs 12/3  Antibiotics:    HPI/Subjective:  Patient mildly confused not a good historian.  Family member is concerned with his lack of sleep. She reports that he has no signs of dementia. The blood loss has caused his mental confusion.   Objective: Filed Vitals:   04/28/15 1523 04/29/15 0459  BP: 128/61 136/67  Pulse: 85 104  Temp: 97.8 F (36.6 C) 98.5 F (36.9 C)  Resp: 20 20    Intake/Output Summary (Last 24 hours) at 04/29/15 0741 Last data filed at 04/28/15 1730  Gross per 24 hour  Intake    360 ml  Output   1050 ml  Net   -690 ml   Filed Weights   04/27/15 1453  Weight: 55.6 kg (122 lb 9.2 oz)    Exam:  General: NAD, looks comfortable Cardiovascular: RRR, S1, S2  Respiratory: clear bilaterally, No wheezing, rales or rhonchi Abdomen: soft, non tender, no distention , bowel sounds normal Musculoskeletal: No edema b/l   Data Reviewed: Basic Metabolic Panel:  Recent Labs Lab 04/27/15 1221 04/28/15 0541 04/29/15 0621  NA 130* 134* 134*  K 5.2* 4.7 4.3  CL 96* 103 104  CO2 23 24 24   GLUCOSE 237* 101* 126*  BUN 31* 23* 15  CREATININE 2.10* 1.61* 1.23  CALCIUM 8.6* 8.2* 7.9*   Liver Function Tests:  Recent Labs Lab 04/28/15 0541  AST 9*  ALT 11*  ALKPHOS 53  BILITOT 0.8  PROT 5.9*  ALBUMIN 2.4*    CBC:  Recent Labs Lab  04/27/15 1221 04/28/15 0541  WBC 14.1* 11.9*  NEUTROABS 12.8*  --   HGB 6.6* 8.6*  HCT 20.8* 26.1*  MCV 89.3 87.9  PLT 271 238    BNP (last 3 results)  Recent Labs  02/22/15 1446  BNP 84.0     CBG:  Recent Labs Lab 04/27/15 2004 04/28/15 0810 04/28/15 1212 04/28/15 1701 04/28/15 2018  GLUCAP 95 107* 118* 109* 107*      Studies: Dg Lumbar Spine Complete  04/27/2015  CLINICAL DATA:  Low back pain.  Recent falls. EXAM: LUMBAR SPINE - COMPLETE 4+ VIEW  COMPARISON:  04/04/2015 FINDINGS: There is an L4 body fracture with superior and inferior endplate compression deformities. Height loss is approximately 25% at maximum. An L1 superior endplate fracture is chronic. No subluxation. Spondylosis without focal or advanced disc narrowing. Extensive aortic atherosclerosis. Large stool volume, especially in the rectum. Osteopenia. IMPRESSION: 1. L4 vertebral body acute fracture with mild height loss. 2. Large stool volume with potential rectal impaction. Electronically Signed   By: Monte Fantasia M.D.   On: 04/27/2015 14:12    Scheduled Meds: . feeding supplement (ENSURE ENLIVE)  237 mL Oral BID BM  . Influenza vac split quadrivalent PF  0.5 mL Intramuscular Tomorrow-1000  . insulin aspart  0-15 Units Subcutaneous TID WC  . insulin aspart  0-5 Units Subcutaneous QHS  . mirtazapine  15 mg Oral QHS  . pneumococcal 23 valent vaccine  0.5 mL Intramuscular Tomorrow-1000  . polyethylene glycol  17 g Oral BID  . sodium chloride  3 mL Intravenous Q12H   Continuous Infusions: . sodium chloride 100 mL/hr at 04/29/15 [id number]    Active Problems:   Diabetes mellitus, type 2 (HCC)   Essential hypertension, benign   Symptomatic anemia   Acute renal failure superimposed on stage 3 chronic kidney disease (HCC)   Dehydration   Fecal impaction in rectum (HCC)   Constipation   Falls   Lumbar compression fracture (HCC)   Hyperkalemia   Hyponatremia   BPH (benign prostatic hypertrophy)    Time spent: 20  minutes   Karnisha Lefebre. MD  [facility] Pager [phone]. If 7PM-7AM, please contact night-coverage at [email or web address], password [facility] 04/29/2015, 7:41 AM  LOS: 2 days      By signing my name below, I, Rennis Harding, attest that this documentation has been prepared under the direction and in the presence of Kathie Dike, MD. Electronically signed: Rennis Harding, Scribe. 04/29/2015 12:02pm  I, Dr. Kathie Dike, personally performed the  services described in this documentaiton. All medical record entries made by the scribe were at my direction and in my presence. I have reviewed the chart and agree that the record reflects my personal performance and is accurate and complete  Kathie Dike, MD, 04/29/2015 12:13 PM

## 2015-04-29 NOTE — Progress Notes (Signed)
Nutrition Brief Note  Patient identified on the Malnutrition Screening Tool (MST) Report  He is sleeping soundly following his flexible sigmoidoscopy this afternoon. Talked with his nurse and will follow up to complete full assessment tomorrow when pt is more able to participate.  Colman Cater MS,RD,CSG,LDN Office: [phone] Pager: [phone]

## 2015-04-29 NOTE — Care Management Note (Signed)
Case Management Note  Patient Details  Name: [name] [name] MRN: [id number] Date of Birth: 1930-12-06  Subjective/Objective:                  Pt is from home, lives with his wife whom he cares for. Pt is ind with ADL's at baseline. PT has recommended HH PT at DC. Pt has chosen [facility] for [facility] services.   Action/Plan: Pt plans to return home with [facility] RN/PT services. Romualdo Bolk, of [facility], made aware of referral and will obtain pt info from chart. Will cont to follow.     Expected Discharge Date:  04/29/15               Expected Discharge Plan:  [place]  In-House Referral:  NA  Discharge planning Services  CM Consult  Post Acute Care Choice:  Home Health Choice offered to:  Patient  DME Arranged:    DME Agency:     HH Arranged:  RN, PT [place] Agency:  [place]  Status of Service:  In process, will continue to follow  Medicare Important Message Given:    Date Medicare IM Given:    Medicare IM give by:    Date Additional Medicare IM Given:    Additional Medicare Important Message give by:     If discussed at [place] of Stay Meetings, dates discussed:    Additional Comments:  Sherald Barge, RN 04/29/2015, 1:39 PM

## 2015-04-30 ENCOUNTER — Inpatient Hospital Stay (HOSPITAL_COMMUNITY): Payer: Medicare Other

## 2015-04-30 ENCOUNTER — Encounter (HOSPITAL_COMMUNITY): Payer: Self-pay | Admitting: Internal Medicine

## 2015-04-30 DIAGNOSIS — D649 Anemia, unspecified: Secondary | ICD-10-CM

## 2015-04-30 DIAGNOSIS — D509 Iron deficiency anemia, unspecified: Secondary | ICD-10-CM

## 2015-04-30 DIAGNOSIS — E43 Unspecified severe protein-calorie malnutrition: Secondary | ICD-10-CM | POA: Insufficient documentation

## 2015-04-30 LAB — GLUCOSE, CAPILLARY
GLUCOSE-CAPILLARY: 131 mg/dL — AB (ref 65–99)
Glucose-Capillary: 122 mg/dL — ABNORMAL HIGH (ref 65–99)
Glucose-Capillary: 118 mg/dL — ABNORMAL HIGH (ref 65–99)
Glucose-Capillary: 101 mg/dL — ABNORMAL HIGH (ref 65–99)

## 2015-04-30 LAB — CBC
HCT: 27.7 % — ABNORMAL LOW (ref 39.0–52.0)
HEMOGLOBIN: 8.9 g/dL — AB (ref 13.0–17.0)
MCH: 28.7 pg (ref 26.0–34.0)
MCHC: 32.1 g/dL (ref 30.0–36.0)
MCV: 89.4 fL (ref 78.0–100.0)
PLATELETS: 212 10*3/uL (ref 150–400)
RBC: 3.1 MIL/uL — ABNORMAL LOW (ref 4.22–5.81)
RDW: 15.7 % — ABNORMAL HIGH (ref 11.5–15.5)
WBC: 9.8 10*3/uL (ref 4.0–10.5)

## 2015-04-30 MED ORDER — IOHEXOL 300 MG/ML  SOLN
100.0000 mL | Freq: Once | INTRAMUSCULAR | Status: AC | PRN
  Administered 2015-04-30: 100 mL via INTRAVENOUS

## 2015-04-30 NOTE — Discharge Instructions (Signed)

## 2015-04-30 NOTE — Progress Notes (Signed)
    Subjective: Denies abdominal pain. Awaiting CT. Quiet, resting in bed with eyes closed.   Objective: Vital signs in last 24 hours: Temp:  [98.1 F (36.7 C)-98.4 F (36.9 C)] 98.1 F (36.7 C) (12/06 0619) Pulse Rate:  [30-123] 94 (12/06 0619) Resp:  [15-30] 16 (12/06 0619) BP: (94-154)/(51-136) 118/63 mmHg (12/06 0619) SpO2:  [88 %-100 %] 100 % (12/06 0619) Last BM Date: 04/29/15 General:   Alert and oriented, pleasant Head:  Normocephalic and atraumatic. Eyes:  No icterus, sclera clear. Conjuctiva pink.  Abdomen:  Bowel sounds present, soft, non-tender, non-distended. Left inguinal hernia Extremities:  Without  edema. Neurologic:  Alert and  oriented to person.   Intake/Output from previous day: 12/05 0701 - 12/06 0700 In: 440 [P.O.:440] Out: -  Intake/Output this shift:    Lab Results:  Recent Labs  04/27/15 1221 04/28/15 0541 04/29/15 0621  WBC 14.1* 11.9* 12.9*  HGB 6.6* 8.6* 8.5*  HCT 20.8* 26.1* 26.2*  PLT 271 238 249   BMET  Recent Labs  04/27/15 1221 04/28/15 0541 04/29/15 0621  NA 130* 134* 134*  K 5.2* 4.7 4.3  CL 96* 103 104  CO2 23 24 24   GLUCOSE 237* 101* 126*  BUN 31* 23* 15  CREATININE 2.10* 1.61* 1.23  CALCIUM 8.6* 8.2* 7.9*   LFT  Recent Labs  04/28/15 0541  PROT 5.9*  ALBUMIN 2.4*  AST 9*  ALT 11*  ALKPHOS 28  BILITOT 0.8   Assessment: 81 year old male with history of IDA, admitted with Hgb 6.6, heme positive stool, with recent EGD on Sept 2016 while inpatient with chronic gastritis and negative H.pylori Colonoscopy was incomplete due to poor prep and torturous colon. Repeat colonoscopy attempted 12/5 with rectal ulcers and occlusion of the upstream colonic lumen, query extrinsic compression and possibly secondary to entrapment by hernia sac. CT ordered today.   Plan: CT today Follow-up on path from rectal ulcers Further recommendations to follow  Orvil Feil, ANP-BC [facility] Gastroenterology    LOS: 3 days    04/30/2015, 8:01 AM    Addendum: CT reviewed. Suspicious mass in distal small bowel, enlarged ileocolic lymph node, left inguinal hernia with loop of sigmoid colon as likely culp[rit for inability to complete colonoscopy. Will request surgical consultation. Orvil Feil, ANP-BC [facility] Gastroenterology

## 2015-04-30 NOTE — Progress Notes (Signed)
[facility] PROGRESS NOTE  [name] [name] [id number] DOB: 06-15-1930 DOA: 04/27/2015 PCP: [name], MD Summary  81 year old male with multiple complaints admitted for symptomatic iron deficiency anemia. Transfused 2U PRBCs and Hgb remained stable. Colonoscopy conducted 12/05 was limited. CT A/P for further evaluation 12/06. Anticipate discharge 12/07.  Assessment/Plan: 1. Symptomatic iron deficiency anemia. Hgb stable following transfusion of 2U PRBCs. Will continue to monitor. Serum iron 16 and ferritin WNL. Anemia is likely multifactorial due to chronic disease as well as possible chronic GI blood loss. Stool occult blood is positive. GI has planned a CT A/P to define intraabdominal anatomy today. Continue clear liquid diet 2. Colonoscopy 12/05 with report as below.  3. AKI superimposed on CKD stage III, likely related to poor oral intake and anemia. Resolved with IVF. Continue to hold ACE inhibitor.  4. Dehydration due to poor oral intake. Resolved with IVF and normal diet.  5. Hyponatremia related to volume depletion, will continue IVF and monitor. 6. Hyperkalemia, likely related to dehydration. Resolved. 7. Acute compression fracture of L4 related to recent falls, provide pain management and PT consult.  8. Fecal impaction, resolved with enema. Will continue on a regular bowel regimen.  9. Anorexia, Family reports he has not been eating or drinking well for the past several months. Weight appears to be declining. Depression may also be playing a role and he has been asked to follow up with his PCP for further management. Patient was started on Remeron. Daughter reports that he is eating and drinking well.  10. DM type 2, stable. Continue SSI. Hold metformin for now. 11. Essential HTN, BP soft. Will hold antihypertensives and monitor. 12. CAD, stable. Denies any CP.  13. Anxiety, continue home meds. 14. BPH, continue flomax and finasteride   Code Status: Full  DVT  Prophylaxis: SCDs Family Communication Daughter and wife bedside. Discussed with family who understands and has no concerns at this time. Disposition Plan: Discharge home with [facility] 12/7.   Consultants:  GI  PT- Discharge home with [facility].  Procedures:  Transfusion of 2U PRBCs 12/3  Colonoscopy  IMPRESSION: 1. Rectal ulcers biopsied. 2. Colonic diverticulosis. Occlusion of the upstream colonic lumen  possibly from extrinsic compression. Query, segment of colon  entrapped in a hernia sac precluded complete colonoscopy. 3. No recent CT of the abdomen.  Antibiotics:    HPI/Subjective: Daughter reports that he is feeling better then yesterday, He was able to tolerate liquids and get rest. Has complained of left knee pain.   Objective: Filed Vitals:   04/29/15 2142 04/30/15 0619  BP: 110/59 118/63  Pulse: 88 94  Temp: 98.2 F (36.8 C) 98.1 F (36.7 C)  Resp: 16 16    Intake/Output Summary (Last 24 hours) at 04/30/15 0934 Last data filed at 04/30/15 0800  Gross per 24 hour  Intake    560 ml  Output      0 ml  Net    560 ml   Filed Weights   04/27/15 1453  Weight: 55.6 kg (122 lb 9.2 oz)    Exam:  General: NAD, looks comfortable Cardiovascular: RRR, S1, S2  Respiratory: clear bilaterally, No wheezing, rales or rhonchi Abdomen: soft, non tender, no distention , bowel sounds normal Musculoskeletal: No edema b/l   Data Reviewed: Basic Metabolic Panel:  Recent Labs Lab 04/27/15 1221 04/28/15 0541 04/29/15 0621  NA 130* 134* 134*  K 5.2* 4.7 4.3  CL 96* 103 104  CO2 23 24 24   GLUCOSE  237* 101* 126*  BUN 31* 23* 15  CREATININE 2.10* 1.61* 1.23  CALCIUM 8.6* 8.2* 7.9*   Liver Function Tests:  Recent Labs Lab 04/28/15 0541  AST 9*  ALT 11*  ALKPHOS 53  BILITOT 0.8  PROT 5.9*  ALBUMIN 2.4*    CBC:  Recent Labs Lab 04/27/15 1221 04/28/15 0541 04/29/15 0621 04/30/15 0814  WBC 14.1* 11.9* 12.9* 9.8  NEUTROABS 12.8*  --   --   --    HGB 6.6* 8.6* 8.5* 8.9*  HCT 20.8* 26.1* 26.2* 27.7*  MCV 89.3 87.9 87.9 89.4  PLT 271 238 249 212    BNP (last 3 results)  Recent Labs  02/22/15 1446  BNP 84.0     CBG:  Recent Labs Lab 04/29/15 0715 04/29/15 1129 04/29/15 1636 04/29/15 2138 04/30/15 0729  GLUCAP 133* 117* 113* 153* 131*      Studies: No results found.  Scheduled Meds: . feeding supplement (ENSURE ENLIVE)  237 mL Oral BID BM  . Influenza vac split quadrivalent PF  0.5 mL Intramuscular Tomorrow-1000  . insulin aspart  0-15 Units Subcutaneous TID WC  . insulin aspart  0-5 Units Subcutaneous QHS  . mirtazapine  15 mg Oral QHS  . pneumococcal 23 valent vaccine  0.5 mL Intramuscular Tomorrow-1000  . polyethylene glycol  17 g Oral Daily  . sodium chloride  3 mL Intravenous Q12H   Continuous Infusions:    Active Problems:   Diabetes mellitus, type 2 ([place])   Essential hypertension, benign   Symptomatic anemia   Acute renal failure superimposed on stage 3 chronic kidney disease (HCC)   Dehydration   Fecal impaction in rectum (HCC)   Constipation   Falls   Lumbar compression fracture (HCC)   Hyperkalemia   Hyponatremia   BPH (benign prostatic hypertrophy)    Time spent: 20  minutes   Deshanae Lindo. MD  [facility] Pager [phone]. If 7PM-7AM, please contact night-coverage at [email or web address], password [facility] 04/30/2015, 9:34 AM  LOS: 3 days      By signing my name below, I, Rennis Harding, attest that this documentation has been prepared under the direction and in the presence of Kathie Dike, MD. Electronically signed: Rennis Harding, Scribe. 04/30/2015 10:35am  I, Dr. Kathie Dike, personally performed the services described in this documentaiton. All medical record entries made by the scribe were at my direction and in my presence. I have reviewed the chart and agree that the record reflects my personal performance and is accurate and complete  Kathie Dike, MD,  04/30/2015 10:51 AM

## 2015-04-30 NOTE — Progress Notes (Addendum)
Initial Nutrition Assessment  DOCUMENTATION CODES:   Severe malnutrition in context of acute illness/injury  INTERVENTION:  Ensure Enlive po BID, each supplement provides 350 kcal and 20 grams of protein    NUTRITION DIAGNOSIS:   Inadequate oral intake related to poor appetite as evidenced by per patient/family report.   GOAL:   Patient will meet greater than or equal to 90% of their needs   MONITOR:   PO intake, Supplement acceptance  REASON FOR ASSESSMENT:   Malnutrition Screening Tool    ASSESSMENT: Pt has hx of DM type 2, CKD stage III and anemia.  Recent GIB, gastritis and duodenitis. He has flexible sigmoidoscopy with biopsy of rectal ulcers yesterday. This morning he is awake and sitting up in bed. His daughter and wife are here. Daughter says he is much more "himself" today. Patients oral intake has been decreasing getting worse for the past month. He "just has not had any appetite".  She has been encouraging him to drink Ensure at home but he c/o too sweet. We talked about ways to alter the Ensure flavor and enhance nutritional value. He has also been started on Remeron which hopefully will help with his appetite.  His usual weight is usually around 135-140#.  His weight is down 5.4% in < 1 month which is significant and he has moderate / severe muscle loss. His energy intake </=50% for >/= 5 days.  Labs: Low sodium 134, glucose- 126 yesterday.  On (12/6)- H/H-8.9/27.7.   Diet Order:  Diet clear liquid Room service appropriate?: Yes; Fluid consistency:: Thin  Skin:   intact  Last BM:   12/5   Height:   Ht Readings from Last 1 Encounters:  04/27/15 5\' 5"  (1.651 m)    Weight:   Wt Readings from Last 1 Encounters:  04/27/15 122 lb 9.2 oz (55.6 kg)    Ideal Body Weight:  61.8 kg  BMI:  Body mass index is 20.4 kg/(m^2).  Estimated Nutritional Needs:   Kcal:  [id number]  Protein:  67-83 kg  Fluid:  1.7-2.0 liters daily  EDUCATION NEEDS:    Education needs addressed  Colman Cater MS,RD,CSG,LDN Office: [phone] Pager: [phone]

## 2015-05-01 ENCOUNTER — Inpatient Hospital Stay (HOSPITAL_COMMUNITY): Payer: Medicare Other

## 2015-05-01 DIAGNOSIS — Z01810 Encounter for preprocedural cardiovascular examination: Secondary | ICD-10-CM

## 2015-05-01 DIAGNOSIS — K629 Disease of anus and rectum, unspecified: Secondary | ICD-10-CM | POA: Insufficient documentation

## 2015-05-01 DIAGNOSIS — K6389 Other specified diseases of intestine: Secondary | ICD-10-CM | POA: Insufficient documentation

## 2015-05-01 DIAGNOSIS — I10 Essential (primary) hypertension: Secondary | ICD-10-CM

## 2015-05-01 DIAGNOSIS — Z01818 Encounter for other preprocedural examination: Secondary | ICD-10-CM

## 2015-05-01 DIAGNOSIS — K4091 Unilateral inguinal hernia, without obstruction or gangrene, recurrent: Secondary | ICD-10-CM

## 2015-05-01 DIAGNOSIS — I25708 Atherosclerosis of coronary artery bypass graft(s), unspecified, with other forms of angina pectoris: Secondary | ICD-10-CM

## 2015-05-01 LAB — GLUCOSE, CAPILLARY
Glucose-Capillary: 131 mg/dL — ABNORMAL HIGH (ref 65–99)
Glucose-Capillary: 164 mg/dL — ABNORMAL HIGH (ref 65–99)
Glucose-Capillary: 133 mg/dL — ABNORMAL HIGH (ref 65–99)

## 2015-05-01 LAB — CBC
HEMATOCRIT: 26.9 % — AB (ref 39.0–52.0)
HEMOGLOBIN: 8.6 g/dL — AB (ref 13.0–17.0)
MCH: 28.6 pg (ref 26.0–34.0)
MCHC: 32 g/dL (ref 30.0–36.0)
MCV: 89.4 fL (ref 78.0–100.0)
Platelets: 229 10*3/uL (ref 150–400)
RBC: 3.01 MIL/uL — AB (ref 4.22–5.81)
RDW: 15.7 % — AB (ref 11.5–15.5)
WBC: 11.5 10*3/uL — AB (ref 4.0–10.5)

## 2015-05-01 MED ORDER — SODIUM CHLORIDE 0.9 % IV SOLN
Freq: Once | INTRAVENOUS | Status: AC
  Administered 2015-05-03: 07:00:00 via INTRAVENOUS

## 2015-05-01 NOTE — Progress Notes (Signed)
Patient Demographics  [name] [name], is a 81 y.o. male, DOB - 13-Jan-1931, [id number]  Admit date - 04/27/2015   Admitting Physician Kathie Dike, MD  Outpatient Primary MD for the patient is [name], MD  LOS - 4   Chief Complaint  Patient presents with  . Back Pain       Admission HPI/Brief narrative: 81 year old male with a hx of anemia, DM type 2, HTN, CAD, HLD, and CKD stage III that presents with chronic anemia with occult GI bleeding. Transfused 2 units PRBC,Colonoscopy was performed which revealed an extrinsic mass pushing on the colon. A CT scan  reveals a possible small bowel neoplasm or inflammatory mass in the distal small bowel in the pelvis.   Subjective:   [name] [name] today has, No headache, No chest pain, No abdominal pain - No Nausea,No Cough - SOB, tolerating soft diet.  Assessment & Plan    Active Problems:   Diabetes mellitus, type 2 (HCC)   Essential hypertension, benign   Symptomatic anemia   Acute renal failure superimposed on stage 3 chronic kidney disease (HCC)   Dehydration   Fecal impaction in rectum (HCC)   Constipation   Falls   Lumbar compression fracture (HCC)   Hyperkalemia   Hyponatremia   BPH (benign prostatic hypertrophy)   Protein-calorie malnutrition, severe   Small bowel mass   Unilateral recurrent inguinal hernia without obstruction or gangrene   symptomatic iron deficiency anemia  - Transfused 2 units PRBC, hemoglobin remained stable, iron 16, ferritin WNL, is most likely due to chronic GI blood loss secondary to small bowel mass .   possible neoplastic process and distal small bowel - CT abdomen and pelvis with evidence of masslike lesion in distal small bowel. - Surgical consult greatly appreciated, plan for exploratory laparotomy on 12/9/716 - Cardiology consulted for preop clearance  Diabetes mellitus - CBG controlled on insulin   sliding scale,   Hypertension - Soft blood pressure, continue to hold antihypertensive medication  Hyponatremia/hypokalemia - recheck in am  Fecal impaction - resolved with enema  Anorexia - started on Remeron, continue supplement.  CAD - Has any chest pain or shortness of breath  Code Status: Full  Family Communication: wife and daughter at bedside   Disposition Plan: Pending further work up.   Procedures  Colonoscopy   Consults   Gastroenterology   general surgery   cardiology   Medications  Scheduled Meds: . sodium chloride   Intravenous Once  . feeding supplement (ENSURE ENLIVE)  237 mL Oral BID BM  . Influenza vac split quadrivalent PF  0.5 mL Intramuscular Tomorrow-1000  . insulin aspart  0-15 Units Subcutaneous TID WC  . insulin aspart  0-5 Units Subcutaneous QHS  . mirtazapine  15 mg Oral QHS  . pneumococcal 23 valent vaccine  0.5 mL Intramuscular Tomorrow-1000  . polyethylene glycol  17 g Oral Daily  . sodium chloride  3 mL Intravenous Q12H   Continuous Infusions:  PRN Meds:.acetaminophen **OR** acetaminophen, HYDROcodone-acetaminophen, ondansetron **OR** ondansetron (ZOFRAN) IV, zolpidem  DVT Prophylaxis  SCDs ,  no chemical DVT prophylaxis given GI bleed   Lab Results  Component Value Date   PLT 229 05/01/2015    Antibiotics   Anti-infectives  None          Objective:   Filed Vitals:   04/30/15 0619 04/30/15 1334 04/30/15 2103 05/01/15 0525  BP: 118/63 127/53 122/57 130/58  Pulse: 94 102 93 100  Temp: 98.1 F (36.7 C) 98.2 F (36.8 C) 98.4 F (36.9 C) 98.2 F (36.8 C)  TempSrc: Oral Oral Oral Oral  Resp: 16 16 20 20   Height:      Weight:      SpO2: 100% 99% 99% 98%    Wt Readings from Last 3 Encounters:  04/27/15 55.6 kg (122 lb 9.2 oz)  04/04/15 59 kg (130 lb 1.1 oz)  03/21/15 61.78 kg (136 lb 3.2 oz)     Intake/Output Summary (Last 24 hours) at 05/01/15 1145 Last data filed at 04/30/15 1700  Gross per 24 hour   Intake    480 ml  Output      0 ml  Net    480 ml     Physical Exam General: NAD, looks comfortable  Cardiovascular: RRR, S1, S2  Respiratory: clear bilaterally, No wheezing, rales or rhonchi  Abdomen: soft, non tender, no distention , bowel sounds normal  Musculoskeletal: No edema b/l    Data Review   Micro Results No results found for this or any previous visit (from the past 240 hour(s)).  Radiology Reports Dg Lumbar Spine Complete  04/27/2015  CLINICAL DATA:  Low back pain.  Recent falls. EXAM: LUMBAR SPINE - COMPLETE 4+ VIEW COMPARISON:  04/04/2015 FINDINGS: There is an L4 body fracture with superior and inferior endplate compression deformities. Height loss is approximately 25% at maximum. An L1 superior endplate fracture is chronic. No subluxation. Spondylosis without focal or advanced disc narrowing. Extensive aortic atherosclerosis. Large stool volume, especially in the rectum. Osteopenia. IMPRESSION: 1. L4 vertebral body acute fracture with mild height loss. 2. Large stool volume with potential rectal impaction. Electronically Signed   By: Monte Fantasia M.D.   On: 04/27/2015 14:12   Dg Lumbar Spine Complete  04/04/2015  CLINICAL DATA:  Fall 1 week prior.  Low back pain. EXAM: LUMBAR SPINE - COMPLETE 4+ VIEW COMPARISON:  None. FINDINGS: This report assumes 5 non rib-bearing lumbar vertebrae. There is a very mild anterior L1 vertebral body compression deformity of indeterminate chronicity, favor chronic. Remaining lumbar vertebral body heights are preserved. Mild degenerative disc disease at L2-3. Mild spondylosis throughout the remaining lumbar spine. No spondylolisthesis. Mild facet arthropathy. No appreciable foraminal stenosis. No aggressive appearing focal osseous lesions. IMPRESSION: 1. Very mild anterior L1 vertebral compression deformity of indeterminate chronicity, favor chronic. 2. Mild degenerative disc disease and facet arthropathy in the lumbar spine as described.  Electronically Signed   By: Ilona Sorrel M.D.   On: 04/04/2015 13:19   Ct Abdomen Pelvis W Contrast  04/30/2015  CLINICAL DATA:  Incomplete colonoscopy. EXAM: CT ABDOMEN AND PELVIS WITH CONTRAST TECHNIQUE: Multidetector CT imaging of the abdomen and pelvis was performed using the standard protocol following bolus administration of intravenous contrast. CONTRAST:  132mL OMNIPAQUE IOHEXOL 300 MG/ML  SOLN COMPARISON:  None. FINDINGS: Lower chest: Chronic appearing pleural thickening with calcification overlies the posterior left lung base. Overlying scar versus atelectasis noted. 3 mm right middle lobe lung nodule is identified, image number 3 of series 6. Hepatobiliary: Mild diffuse hepatic steatosis. The gallbladder appears normal. No biliary dilatation. Pancreas: Normal appearance of the pancreas. Spleen: The spleen is unremarkable. Adrenals/Urinary Tract: The adrenal glands are normal. Bilateral renal cysts are identified. The urinary bladder appears  normal. Stomach/Bowel: The stomach appears normal. Small bowel loops have a normal course and caliber. There is no evidence for a bowel obstruction. Circumferential mass involving the pelvic small bowel loop is identified. This measures 5.3 x 3.9 x 4.3 cm and results in moderate narrowing of the small bowel lumen, image 61 of series 2 and image 62 of series 3. There is a large left inguinal hernia which contains a loop of sigmoid colon. Sigmoid colon diverticula noted. Vascular/Lymphatic: Calcified atherosclerotic disease involves the abdominal aorta. No aneurysm. Enlarged lymph node within the lower abdominal ileocolic mesentery measures 1.4 cm, image 50 of series 2. Reproductive: Mild prostate gland enlargement. Other: No free fluid or fluid collections identified. No peritoneal nodule or mass. Musculoskeletal: Degenerative disc disease noted within the lumbar spine. No aggressive lytic or sclerotic bone lesions. IMPRESSION: 1. Suspicious mass involving a loop of  distal small bowel, likely ileum is identified and worrisome for small bowel neoplasm. This does not resolve then any significant small bowel obstruction. Surgical consultation suggested. 2. Enlarged ileocolic lymph node within the lower abdomen may represent a focus of metastatic adenopathy. 3. Left inguinal hernia contains a loop of sigmoid colon which may account for difficulty with colonoscopy. 4. Aortic atherosclerosis. 5. Hepatic steatosis. Electronically Signed   By: Kerby Moors M.D.   On: 04/30/2015 12:12   Dg Chest Portable 1 View  04/04/2015  CLINICAL DATA:  Shortness of breath.  Fall 1 week ago. EXAM: PORTABLE CHEST - 1 VIEW COMPARISON:  Two-view chest x-ray 02/22/2015. FINDINGS: The heart size is exaggerated by low lung volumes. Emphysematous changes are again noted. Median sternotomy for CABG is evident. The visualized soft tissues and bony thorax are unremarkable. IMPRESSION: No acute cardiopulmonary disease or significant interval change. Emphysema. Electronically Signed   By: San Morelle M.D.   On: 04/04/2015 11:42   Dg Hips Bilat With Pelvis 3-4 Views  04/04/2015  CLINICAL DATA:  [place] [address] in bathroom 1 week ago, LEFT hip and low back pain, initial encounter, personal history diabetes mellitus, hypertension, TIA, coronary artery disease EXAM: DG HIP (WITH OR WITHOUT PELVIS) 3-4V BILAT COMPARISON:  None. FINDINGS: Diffuse osseous demineralization. Symmetric hip and SI joints, appear preserved. No acute fracture, dislocation, or bone destruction. Scattered atherosclerotic calcifications. Bowel gas is identified extending into the LEFT hemiscrotum compatible with inguinal hernia. IMPRESSION: Osseous demineralization without acute bony abnormalities. LEFT inguinal hernia containing bowel. Electronically Signed   By: Lavonia Dana M.D.   On: 04/04/2015 13:07     CBC  Recent Labs Lab 04/27/15 1221 04/28/15 0541 04/29/15 0621 04/30/15 0814 05/01/15 0624  WBC 14.1* 11.9* 12.9* 9.8  11.5*  HGB 6.6* 8.6* 8.5* 8.9* 8.6*  HCT 20.8* 26.1* 26.2* 27.7* 26.9*  PLT 271 238 249 212 229  MCV 89.3 87.9 87.9 89.4 89.4  MCH 28.3 29.0 28.5 28.7 28.6  MCHC 31.7 33.0 32.4 32.1 32.0  RDW 16.8* 15.4 15.6* 15.7* 15.7*  LYMPHSABS 0.6*  --   --   --   --   MONOABS 0.6  --   --   --   --   EOSABS 0.2  --   --   --   --   BASOSABS 0.0  --   --   --   --     Chemistries   Recent Labs Lab 04/27/15 1221 04/28/15 0541 04/29/15 0621  NA 130* 134* 134*  K 5.2* 4.7 4.3  CL 96* 103 104  CO2 23 24 24   GLUCOSE 237*  101* 126*  BUN 31* 23* 15  CREATININE 2.10* 1.61* 1.23  CALCIUM 8.6* 8.2* 7.9*  AST  --  9*  --   ALT  --  11*  --   ALKPHOS  --  53  --   BILITOT  --  0.8  --    ------------------------------------------------------------------------------------------------------------------ estimated creatinine clearance is 36.4 mL/min (by C-G formula based on Cr of 1.23). ------------------------------------------------------------------------------------------------------------------ No results for input(s): HGBA1C in the last 72 hours. ------------------------------------------------------------------------------------------------------------------ No results for input(s): CHOL, HDL, LDLCALC, TRIG, CHOLHDL, LDLDIRECT in the last 72 hours. ------------------------------------------------------------------------------------------------------------------ No results for input(s): TSH, T4TOTAL, T3FREE, THYROIDAB in the last 72 hours.  Invalid input(s): FREET3 ------------------------------------------------------------------------------------------------------------------ No results for input(s): VITAMINB12, FOLATE, FERRITIN, TIBC, IRON, RETICCTPCT in the last 72 hours.  Coagulation profile No results for input(s): INR, PROTIME in the last 168 hours.  No results for input(s): DDIMER in the last 72 hours.  Cardiac Enzymes No results for input(s): CKMB, TROPONINI, MYOGLOBIN in the  last 168 hours.  Invalid input(s): CK ------------------------------------------------------------------------------------------------------------------ Invalid input(s): POCBNP     Time Spent in minutes   25 minutes   ELGERGAWY, DAWOOD M.D on 05/01/2015 at 11:45 AM  Between 7am to 7pm - Pager - [phone]  After 7pm go to [email or web address] - password [facility]  [facility]   Office  [phone]

## 2015-05-01 NOTE — Progress Notes (Signed)
Subjective:  Patient eating breakfast. Complains of some abdominal pain last night but better right now. Wife and daughter at bedside. Complains of feeling like needs to have BM but can't go.   Objective: Vital signs in last 24 hours: Temp:  [98.2 F (36.8 C)-98.4 F (36.9 C)] 98.2 F (36.8 C) (12/07 0525) Pulse Rate:  [93-102] 100 (12/07 0525) Resp:  [16-20] 20 (12/07 0525) BP: (122-130)/(53-58) 130/58 mmHg (12/07 0525) SpO2:  [98 %-99 %] 98 % (12/07 0525) Last BM Date: 04/30/15 General:   Alert,  thin, pleasant and cooperative in NAD Head:  Normocephalic and atraumatic. Eyes:  Sclera clear, no icterus.  Abdomen:  Soft, nontender and nondistended. Normal bowel sounds, without guarding, and without rebound.   Extremities:  Without clubbing, deformity or edema. Neurologic:  Alert and  oriented x4;  grossly normal neurologically. Skin:  Intact without significant lesions or rashes. Psych:  Alert and cooperative. Normal mood and affect.  Intake/Output from previous day: 12/06 0701 - 12/07 0700 In: 600 [P.O.:600] Out: -  Intake/Output this shift:    Lab Results: CBC  Recent Labs  04/29/15 0621 04/30/15 0814 05/01/15 0624  WBC 12.9* 9.8 11.5*  HGB 8.5* 8.9* 8.6*  HCT 26.2* 27.7* 26.9*  MCV 87.9 89.4 89.4  PLT 249 212 229   BMET  Recent Labs  04/29/15 0621  NA 134*  K 4.3  CL 104  CO2 24  GLUCOSE 126*  BUN 15  CREATININE 1.23  CALCIUM 7.9*   LFTs No results for input(s): BILITOT, BILIDIR, IBILI, ALKPHOS, AST, ALT, PROT, ALBUMIN in the last 72 hours. No results for input(s): LIPASE in the last 72 hours. PT/INR No results for input(s): LABPROT, INR in the last 72 hours.    Imaging Studies: Dg Lumbar Spine Complete  04/27/2015  CLINICAL DATA:  Low back pain.  Recent falls. EXAM: LUMBAR SPINE - COMPLETE 4+ VIEW COMPARISON:  04/04/2015 FINDINGS: There is an L4 body fracture with superior and inferior endplate compression deformities. Height loss is approximately  25% at maximum. An L1 superior endplate fracture is chronic. No subluxation. Spondylosis without focal or advanced disc narrowing. Extensive aortic atherosclerosis. Large stool volume, especially in the rectum. Osteopenia. IMPRESSION: 1. L4 vertebral body acute fracture with mild height loss. 2. Large stool volume with potential rectal impaction. Electronically Signed   By: Monte Fantasia M.D.   On: 04/27/2015 14:12   Dg Lumbar Spine Complete  04/04/2015  CLINICAL DATA:  Fall 1 week prior.  Low back pain. EXAM: LUMBAR SPINE - COMPLETE 4+ VIEW COMPARISON:  None. FINDINGS: This report assumes 5 non rib-bearing lumbar vertebrae. There is a very mild anterior L1 vertebral body compression deformity of indeterminate chronicity, favor chronic. Remaining lumbar vertebral body heights are preserved. Mild degenerative disc disease at L2-3. Mild spondylosis throughout the remaining lumbar spine. No spondylolisthesis. Mild facet arthropathy. No appreciable foraminal stenosis. No aggressive appearing focal osseous lesions. IMPRESSION: 1. Very mild anterior L1 vertebral compression deformity of indeterminate chronicity, favor chronic. 2. Mild degenerative disc disease and facet arthropathy in the lumbar spine as described. Electronically Signed   By: Ilona Sorrel M.D.   On: 04/04/2015 13:19   Ct Abdomen Pelvis W Contrast  04/30/2015  CLINICAL DATA:  Incomplete colonoscopy. EXAM: CT ABDOMEN AND PELVIS WITH CONTRAST TECHNIQUE: Multidetector CT imaging of the abdomen and pelvis was performed using the standard protocol following bolus administration of intravenous contrast. CONTRAST:  177mL OMNIPAQUE IOHEXOL 300 MG/ML  SOLN COMPARISON:  None. FINDINGS: Lower chest:  Chronic appearing pleural thickening with calcification overlies the posterior left lung base. Overlying scar versus atelectasis noted. 3 mm right middle lobe lung nodule is identified, image number 3 of series 6. Hepatobiliary: Mild diffuse hepatic steatosis.  The gallbladder appears normal. No biliary dilatation. Pancreas: Normal appearance of the pancreas. Spleen: The spleen is unremarkable. Adrenals/Urinary Tract: The adrenal glands are normal. Bilateral renal cysts are identified. The urinary bladder appears normal. Stomach/Bowel: The stomach appears normal. Small bowel loops have a normal course and caliber. There is no evidence for a bowel obstruction. Circumferential mass involving the pelvic small bowel loop is identified. This measures 5.3 x 3.9 x 4.3 cm and results in moderate narrowing of the small bowel lumen, image 61 of series 2 and image 62 of series 3. There is a large left inguinal hernia which contains a loop of sigmoid colon. Sigmoid colon diverticula noted. Vascular/Lymphatic: Calcified atherosclerotic disease involves the abdominal aorta. No aneurysm. Enlarged lymph node within the lower abdominal ileocolic mesentery measures 1.4 cm, image 50 of series 2. Reproductive: Mild prostate gland enlargement. Other: No free fluid or fluid collections identified. No peritoneal nodule or mass. Musculoskeletal: Degenerative disc disease noted within the lumbar spine. No aggressive lytic or sclerotic bone lesions. IMPRESSION: 1. Suspicious mass involving a loop of distal small bowel, likely ileum is identified and worrisome for small bowel neoplasm. This does not resolve then any significant small bowel obstruction. Surgical consultation suggested. 2. Enlarged ileocolic lymph node within the lower abdomen may represent a focus of metastatic adenopathy. 3. Left inguinal hernia contains a loop of sigmoid colon which may account for difficulty with colonoscopy. 4. Aortic atherosclerosis. 5. Hepatic steatosis. Electronically Signed   By: Kerby Moors M.D.   On: 04/30/2015 12:12   Dg Chest Portable 1 View  04/04/2015  CLINICAL DATA:  Shortness of breath.  Fall 1 week ago. EXAM: PORTABLE CHEST - 1 VIEW COMPARISON:  Two-view chest x-ray 02/22/2015. FINDINGS: The  heart size is exaggerated by low lung volumes. Emphysematous changes are again noted. Median sternotomy for CABG is evident. The visualized soft tissues and bony thorax are unremarkable. IMPRESSION: No acute cardiopulmonary disease or significant interval change. Emphysema. Electronically Signed   By: San Morelle M.D.   On: 04/04/2015 11:42   Dg Hips Bilat With Pelvis 3-4 Views  04/04/2015  CLINICAL DATA:  [place] [address] in bathroom 1 week ago, LEFT hip and low back pain, initial encounter, personal history diabetes mellitus, hypertension, TIA, coronary artery disease EXAM: DG HIP (WITH OR WITHOUT PELVIS) 3-4V BILAT COMPARISON:  None. FINDINGS: Diffuse osseous demineralization. Symmetric hip and SI joints, appear preserved. No acute fracture, dislocation, or bone destruction. Scattered atherosclerotic calcifications. Bowel gas is identified extending into the LEFT hemiscrotum compatible with inguinal hernia. IMPRESSION: Osseous demineralization without acute bony abnormalities. LEFT inguinal hernia containing bowel. Electronically Signed   By: Lavonia Dana M.D.   On: 04/04/2015 13:07  [2 weeks]   Assessment: 81 year old male with history of IDA, admitted with Hgb 6.6, heme positive stool, with recent EGD on 02/2015 while inpatient with chronic gastritis and negative H.pylori Colonoscopy was incomplete due to poor prep and torturous colon. Repeat colonoscopy attempted 12/5 with rectal ulcers and occlusion of the upstream colonic lumen, query extrinsic compression and possibly secondary to entrapment by hernia sac.   CT yesterday showed suspicious distal small bowel mass with some narrowing of the lumen and enlarged ileocolic lymph node in lower abdomen, left inguinal hernia with loop of sigmoid colon.  Plan: 1. Surgical consultation pending. Daughter, Helene Kelp, would like for Dr. Arnoldo Morale to call her if she is not present during consultation.  2. F/u pending path.  Laureen Ochs. Bernarda Caffey [facility]  [facility] [phone] 12/7/20169:23 AM     LOS: 4 days    Addendum: appreciate Dr. Arnoldo Morale input. Discussed plan with daughter, Rosanne Ashing per her request.   Laureen Ochs. Bernarda Caffey [facility] [phone] 12/7/20161:09 PM

## 2015-05-01 NOTE — Consult Note (Addendum)
CARDIOLOGY CONSULT NOTE  Patient ID: [name] [name] MRN: [id number] DOB/AGE: 07-22-1930 81 y.o.  Admit date: 04/27/2015 Primary Physician [name], MD  Reason for Consultation: preop consult, CAD  HPI: The patient is an 81 yr old man who sees Dr. Domenic Polite, most recently on 02/20/15. He had been experiencing progressive exertional angina and dyspnea and given his history of CABG in 1992, an echocardiogram and Lexiscan Cardiolite stress test were ordered. Most recent ischemic evaluation was completed in 2013. He was then hospitalized for symptomatic anemia with occult GI bleeding in early October.  He then saw K. Lawrence NP on 03/21/15 and complained of shortness of breath and fatigue. The aforementioned tests were reordered but have yet to be completed due to readmissions for symptomatic anemia and occult GI bleeding.  He was again readmitted and an abdominal CT is suggestive of a possible small bowel neoplasm vs inflammatory mass in the distal small bowel. Colonoscopy revealed an extrinsic mass compressing the colon.  Dr. Arnoldo Morale has planned for exploratory laparotomy on 05/03/15.  Additional PMH includes type 2 diabetes, hypertension, hyperlipidemia, and CKD stage 3.  He presently denies chest pain and shortness of breath. When I discussed stress testing, he categorically refused it. He seems to be willing to have an echocardiogram.  ECG on 04/27/15 showed sinus tachycardia without ischemic ST-T abnormalities.  Echocardiogram on 09/02/11 showed normal LV systolic function, EF 0000000, grade 1 diastolic dysfunction, possible wall motion abnormalities, mild aortic and mitral and mild to moderate tricuspid regurgitation with mildly elevated pulmonary pressures.        Allergies  Allergen Reactions  . Aspirin Other (See Comments)    unknown    Current Facility-Administered Medications  Medication Dose Route Frequency Provider Last Rate Last Dose  . 0.9 %  sodium chloride  infusion   Intravenous Once Aviva Signs, MD      . acetaminophen (TYLENOL) tablet 650 mg  650 mg Oral Q6H PRN Kathie Dike, MD       Or  . acetaminophen (TYLENOL) suppository 650 mg  650 mg Rectal Q6H PRN Kathie Dike, MD      . feeding supplement (ENSURE ENLIVE) (ENSURE ENLIVE) liquid 237 mL  237 mL Oral BID BM Kathie Dike, MD   237 mL at 04/27/15 1615  . HYDROcodone-acetaminophen (NORCO/VICODIN) 5-325 MG per tablet 1-2 tablet  1-2 tablet Oral Q4H PRN Kathie Dike, MD   2 tablet at 05/01/15 0823  . Influenza vac split quadrivalent PF (FLUARIX) injection 0.5 mL  0.5 mL Intramuscular Tomorrow-1000 Kathie Dike, MD      . insulin aspart (novoLOG) injection 0-15 Units  0-15 Units Subcutaneous TID WC Kathie Dike, MD   2 Units at 05/01/15 0824  . insulin aspart (novoLOG) injection 0-5 Units  0-5 Units Subcutaneous QHS Kathie Dike, MD   0 Units at 04/27/15 2200  . mirtazapine (REMERON) tablet 15 mg  15 mg Oral QHS Kathie Dike, MD   15 mg at 04/30/15 2141  . ondansetron (ZOFRAN) tablet 4 mg  4 mg Oral Q6H PRN Kathie Dike, MD       Or  . ondansetron (ZOFRAN) injection 4 mg  4 mg Intravenous Q6H PRN Kathie Dike, MD      . pneumococcal 23 valent vaccine (PNU-IMMUNE) injection 0.5 mL  0.5 mL Intramuscular Tomorrow-1000 Kathie Dike, MD      . polyethylene glycol (MIRALAX / GLYCOLAX) packet 17 g  17 g Oral Daily Orvil Feil, NP  17 g at 05/01/15 0823  . sodium chloride 0.9 % injection 3 mL  3 mL Intravenous Q12H Kathie Dike, MD   3 mL at 05/01/15 1102  . zolpidem (AMBIEN) tablet 5 mg  5 mg Oral QHS PRN Kathie Dike, MD   5 mg at 04/28/15 2033    Past Medical History  Diagnosis Date  . Diabetes mellitus, type 2 ([place])   . Essential hypertension, benign   . Coronary atherosclerosis of native coronary artery 1992    Multivessel s/p CABG 1992  . Hyperlipidemia   . TIA (transient ischemic attack)   . History of pneumonia   . Benign prostatic hypertrophy   . Chronic  anemia     Past Surgical History  Procedure Laterality Date  . Coronary artery bypass graft  1992  . Tonsillectomy    . Hernia repair    . Cataract extraction Bilateral   . Colonoscopy N/A 02/24/2015    INCOMPLETE DUE TO REDUNDANT LEFT COLON  . Esophagogastroduodenoscopy N/A 02/24/2015    ATROPHIC GASTRITIS, DUODENITIS  . Colonoscopy N/A 04/29/2015    Procedure: COLONOSCOPY;  Surgeon: Daneil Dolin, MD;  Location: [facility];  Service: Endoscopy;  Laterality: N/A;    Social History   Social History  . Marital Status: Married    Spouse Name: N/A  . Number of Children: N/A  . Years of Education: N/A   Occupational History  . Retired    Social History Main Topics  . Smoking status: Former Smoker -- 72 years    Types: Cigarettes    Start date: 05/26/1939    Quit date: 06/20/1994  . Smokeless tobacco: Not on file  . Alcohol Use: No  . Drug Use: No  . Sexual Activity: No   Other Topics Concern  . Not on file   Social History Narrative     No family history of premature CAD in 1st degree relatives.  Prior to Admission medications   Medication Sig Start Date End Date Taking? Authorizing Provider  acetaminophen (TYLENOL) 650 MG CR tablet Take 650 mg by mouth every 8 (eight) hours as needed for pain.   Yes Historical Provider, MD  albuterol (PROVENTIL HFA;VENTOLIN HFA) 108 (90 BASE) MCG/ACT inhaler Inhale 2 puffs into the lungs every 6 (six) hours as needed for wheezing or shortness of breath.   Yes Historical Provider, MD  Cholecalciferol (VITAMIN D PO) Take 1 capsule by mouth daily.   Yes Historical Provider, MD  clonazePAM (KLONOPIN) 0.5 MG tablet Take 1 mg by mouth at bedtime.  01/29/15  Yes Historical Provider, MD  Docusate Sodium (RA COL-RITE PO) Take 2 capsules by mouth at bedtime.   Yes Historical Provider, MD  ferrous sulfate 325 (65 FE) MG tablet Take 1 tablet (325 mg total) by mouth daily with breakfast. 04/05/15  Yes Donne Hazel, MD  finasteride (PROSCAR) 5  MG tablet Take 1 tablet by mouth daily. 04/09/15  Yes Historical Provider, MD  Magnesium 200 MG TABS Take 1 tablet by mouth daily.   Yes Historical Provider, MD  metFORMIN (GLUCOPHAGE) 500 MG tablet Take 500 mg by mouth 2 (two) times daily with a meal.  12/13/13  Yes Historical Provider, MD  Multiple Vitamins-Minerals (ONE-A-DAY 50 PLUS PO) Take 1 tablet by mouth daily.   Yes Historical Provider, MD  Omega-3 Fatty Acids (FISH OIL) 1200 MG CAPS Take 1 capsule by mouth daily.   Yes Historical Provider, MD  pantoprazole (PROTONIX) 40 MG tablet Take 1 tablet (40 mg  total) by mouth daily before breakfast. 02/25/15  Yes Orvan Falconer, MD  quinapril (ACCUPRIL) 20 MG tablet take 1 tablet by mouth once daily 11/28/14  Yes Satira Sark, MD  Tamsulosin HCl (FLOMAX) 0.4 MG CAPS Take 0.4 mg by mouth daily.  07/18/11  Yes Historical Provider, MD  traMADol (ULTRAM) 50 MG tablet Take 1 tablet by mouth every 6 (six) hours as needed for moderate pain.  04/17/15  Yes Historical Provider, MD  vitamin B-12 (CYANOCOBALAMIN) 1000 MCG tablet Take 1,000 mcg by mouth daily.   Yes Historical Provider, MD  cyclobenzaprine (FLEXERIL) 5 MG tablet Take 1 tablet (5 mg total) by mouth 3 (three) times daily as needed for muscle spasms. Patient not taking: Reported on 04/27/2015 04/05/15   Donne Hazel, MD  levofloxacin (LEVAQUIN) 250 MG tablet Take by mouth daily. Starting 04/13/2015, 2 tablets on the first day, then 1 tablet daily for 13 days. 04/13/15   Historical Provider, MD     Review of systems complete and found to be negative unless listed above in HPI     Physical exam Blood pressure 130/58, pulse 100, temperature 98.2 F (36.8 C), temperature source Oral, resp. rate 20, height 5\' 5"  (1.651 m), weight 122 lb 9.2 oz (55.6 kg), SpO2 98 %. General: NAD, elderly, frail, thin build Neck: No JVD, no thyromegaly or thyroid nodule.  Lungs: Dry crackles heard halfway up bilaterally. CV: Tachycardic, regular rhythm, normal  S1/S2, no S3/S4, no murmur.  No peripheral edema. Abdomen: Soft, nontender, no distention.  Skin: Generalized pallor. Neurologic: Alert and oriented.  Psych: Flat affect. Extremities: No clubbing or cyanosis.  HEENT: Normal.   ECG: Most recent ECG reviewed.  Labs:   Lab Results  Component Value Date   WBC 11.5* 05/01/2015   HGB 8.6* 05/01/2015   HCT 26.9* 05/01/2015   MCV 89.4 05/01/2015   PLT 229 05/01/2015    Recent Labs Lab 04/28/15 0541 04/29/15 0621  NA 134* 134*  K 4.7 4.3  CL 103 104  CO2 24 24  BUN 23* 15  CREATININE 1.61* 1.23  CALCIUM 8.2* 7.9*  PROT 5.9*  --   BILITOT 0.8  --   ALKPHOS 53  --   ALT 11*  --   AST 9*  --   GLUCOSE 101* 126*   Lab Results  Component Value Date   TROPONINI <0.03 04/04/2015    Lab Results  Component Value Date   CHOL 143 12/16/2009   CHOL 136 07/23/2009   CHOL 136 01/09/2009   Lab Results  Component Value Date   HDL 44 12/16/2009   HDL 46 07/23/2009   HDL 46 01/09/2009   Lab Results  Component Value Date   LDLCALC 72 12/16/2009   LDLCALC 64 07/23/2009   LDLCALC 66 01/09/2009   Lab Results  Component Value Date   TRIG 133 12/16/2009   TRIG 129 07/23/2009   TRIG 118 01/09/2009   No results found for: CHOLHDL No results found for: LDLDIRECT       Studies: Ct Abdomen Pelvis W Contrast  04/30/2015  CLINICAL DATA:  Incomplete colonoscopy. EXAM: CT ABDOMEN AND PELVIS WITH CONTRAST TECHNIQUE: Multidetector CT imaging of the abdomen and pelvis was performed using the standard protocol following bolus administration of intravenous contrast. CONTRAST:  190mL OMNIPAQUE IOHEXOL 300 MG/ML  SOLN COMPARISON:  None. FINDINGS: Lower chest: Chronic appearing pleural thickening with calcification overlies the posterior left lung base. Overlying scar versus atelectasis noted. 3 mm right middle lobe lung  nodule is identified, image number 3 of series 6. Hepatobiliary: Mild diffuse hepatic steatosis. The gallbladder appears  normal. No biliary dilatation. Pancreas: Normal appearance of the pancreas. Spleen: The spleen is unremarkable. Adrenals/Urinary Tract: The adrenal glands are normal. Bilateral renal cysts are identified. The urinary bladder appears normal. Stomach/Bowel: The stomach appears normal. Small bowel loops have a normal course and caliber. There is no evidence for a bowel obstruction. Circumferential mass involving the pelvic small bowel loop is identified. This measures 5.3 x 3.9 x 4.3 cm and results in moderate narrowing of the small bowel lumen, image 61 of series 2 and image 62 of series 3. There is a large left inguinal hernia which contains a loop of sigmoid colon. Sigmoid colon diverticula noted. Vascular/Lymphatic: Calcified atherosclerotic disease involves the abdominal aorta. No aneurysm. Enlarged lymph node within the lower abdominal ileocolic mesentery measures 1.4 cm, image 50 of series 2. Reproductive: Mild prostate gland enlargement. Other: No free fluid or fluid collections identified. No peritoneal nodule or mass. Musculoskeletal: Degenerative disc disease noted within the lumbar spine. No aggressive lytic or sclerotic bone lesions. IMPRESSION: 1. Suspicious mass involving a loop of distal small bowel, likely ileum is identified and worrisome for small bowel neoplasm. This does not resolve then any significant small bowel obstruction. Surgical consultation suggested. 2. Enlarged ileocolic lymph node within the lower abdomen may represent a focus of metastatic adenopathy. 3. Left inguinal hernia contains a loop of sigmoid colon which may account for difficulty with colonoscopy. 4. Aortic atherosclerosis. 5. Hepatic steatosis. Electronically Signed   By: Kerby Moors M.D.   On: 04/30/2015 12:12    ASSESSMENT AND PLAN:  1. CAD with CABG and prior complaints of progressive exertional angina and dyspnea: Currently denies these symptoms. Given his CABG was over 2 decades ago, I worry that he may have lost  bypass grafts. However, he is unwilling to proceed with stress testing. He is agreeable to an echocardiogram which I will order to assess cardiac structure and function and to assess with preoperative risk stratification. No longer on statin therapy and not on a beta blocker nor ASA (Hgb 8.6).  2. Essential HTN: Controlled at present.  3. Preoperative risk stratification: See #1.   Signed: Kate Sable, M.D., F.A.C.C.  05/01/2015, 2:40 PM  ADDENDUM: I reviewed the echocardiogram which demonstrated normal LV systolic function, EF 0000000, and normal regional wall motion, with valvular heart disease. Given his multiple comorbidities, he is at least at an intermediate risk for a major adverse cardiac event in the perioperative period. No further recommendations at this time. The timeframe is too short to initiate beta blockade in order for it to have any protective effects in the perioperative period.

## 2015-05-01 NOTE — Consult Note (Signed)
Reason for Consult: Intra-abdominal mass Referring Physician: Hospitalist  [name] [name] is an 81 y.o. male.  HPI: A she has an 81 year old white male who has had multiple admissions recently for chronic anemia with occult GI bleeding. He has received multiple blood transfusions in the recent past. A CT scan was performed yesterday which reveals a possible small bowel neoplasm or inflammatory mass in the distal small bowel in the pelvis. Colonoscopy was performed which revealed an extrinsic mass pushing on the colon.  Past Medical History  Diagnosis Date  . Diabetes mellitus, type 2 ([place])   . Essential hypertension, benign   . Coronary atherosclerosis of native coronary artery 1992    Multivessel s/p CABG 1992  . Hyperlipidemia   . TIA (transient ischemic attack)   . History of pneumonia   . Benign prostatic hypertrophy   . Chronic anemia     Past Surgical History  Procedure Laterality Date  . Coronary artery bypass graft  1992  . Tonsillectomy    . Hernia repair    . Cataract extraction Bilateral   . Colonoscopy N/A 02/24/2015    INCOMPLETE DUE TO REDUNDANT LEFT COLON  . Esophagogastroduodenoscopy N/A 02/24/2015    ATROPHIC GASTRITIS, DUODENITIS  . Colonoscopy N/A 04/29/2015    Procedure: COLONOSCOPY;  Surgeon: Daneil Dolin, MD;  Location: [facility];  Service: Endoscopy;  Laterality: N/A;    Family History  Problem Relation Age of Onset  . Cancer Father   . Cancer Mother     Social History:  reports that he quit smoking about 20 years ago. His smoking use included Cigarettes. He started smoking about 75 years ago. He quit after 40 years of use. He does not have any smokeless tobacco history on file. He reports that he does not drink alcohol or use illicit drugs.  Allergies:  Allergies  Allergen Reactions  . Aspirin Other (See Comments)    unknown    Medications: I have reviewed the patient's current medications.  Results for orders placed or performed during the  hospital encounter of 04/27/15 (from the past 48 hour(s))  Glucose, capillary     Status: Abnormal   Collection Time: 04/29/15 11:29 AM  Result Value Ref Range   Glucose-Capillary 117 (H) 65 - 99 mg/dL   Comment 1 Notify RN    Comment 2 Document in Chart   Glucose, capillary     Status: Abnormal   Collection Time: 04/29/15  4:36 PM  Result Value Ref Range   Glucose-Capillary 113 (H) 65 - 99 mg/dL   Comment 1 Notify RN    Comment 2 Document in Chart   Glucose, capillary     Status: Abnormal   Collection Time: 04/29/15  9:38 PM  Result Value Ref Range   Glucose-Capillary 153 (H) 65 - 99 mg/dL   Comment 1 Notify RN    Comment 2 Document in Chart   Glucose, capillary     Status: Abnormal   Collection Time: 04/30/15  7:29 AM  Result Value Ref Range   Glucose-Capillary 131 (H) 65 - 99 mg/dL   Comment 1 Notify RN    Comment 2 Document in Chart   CBC     Status: Abnormal   Collection Time: 04/30/15  8:14 AM  Result Value Ref Range   WBC 9.8 4.0 - 10.5 K/uL   RBC 3.10 (L) 4.22 - 5.81 MIL/uL   Hemoglobin 8.9 (L) 13.0 - 17.0 g/dL   HCT 27.7 (L) 39.0 - 52.0 %  MCV 89.4 78.0 - 100.0 fL   MCH 28.7 26.0 - 34.0 pg   MCHC 32.1 30.0 - 36.0 g/dL   RDW 15.7 (H) 11.5 - 15.5 %   Platelets 212 150 - 400 K/uL  Glucose, capillary     Status: Abnormal   Collection Time: 04/30/15 11:23 AM  Result Value Ref Range   Glucose-Capillary 122 (H) 65 - 99 mg/dL   Comment 1 Notify RN    Comment 2 Document in Chart   Glucose, capillary     Status: Abnormal   Collection Time: 04/30/15  3:59 PM  Result Value Ref Range   Glucose-Capillary 118 (H) 65 - 99 mg/dL   Comment 1 Notify RN    Comment 2 Document in Chart   Glucose, capillary     Status: Abnormal   Collection Time: 04/30/15  9:02 PM  Result Value Ref Range   Glucose-Capillary 101 (H) 65 - 99 mg/dL   Comment 1 Notify RN    Comment 2 Document in Chart   CBC     Status: Abnormal   Collection Time: 05/01/15  6:24 AM  Result Value Ref Range    WBC 11.5 (H) 4.0 - 10.5 K/uL   RBC 3.01 (L) 4.22 - 5.81 MIL/uL   Hemoglobin 8.6 (L) 13.0 - 17.0 g/dL   HCT 26.9 (L) 39.0 - 52.0 %   MCV 89.4 78.0 - 100.0 fL   MCH 28.6 26.0 - 34.0 pg   MCHC 32.0 30.0 - 36.0 g/dL   RDW 15.7 (H) 11.5 - 15.5 %   Platelets 229 150 - 400 K/uL  Glucose, capillary     Status: Abnormal   Collection Time: 05/01/15  7:52 AM  Result Value Ref Range   Glucose-Capillary 131 (H) 65 - 99 mg/dL   Comment 1 Notify RN     Ct Abdomen Pelvis W Contrast  04/30/2015  CLINICAL DATA:  Incomplete colonoscopy. EXAM: CT ABDOMEN AND PELVIS WITH CONTRAST TECHNIQUE: Multidetector CT imaging of the abdomen and pelvis was performed using the standard protocol following bolus administration of intravenous contrast. CONTRAST:  187mL OMNIPAQUE IOHEXOL 300 MG/ML  SOLN COMPARISON:  None. FINDINGS: Lower chest: Chronic appearing pleural thickening with calcification overlies the posterior left lung base. Overlying scar versus atelectasis noted. 3 mm right middle lobe lung nodule is identified, image number 3 of series 6. Hepatobiliary: Mild diffuse hepatic steatosis. The gallbladder appears normal. No biliary dilatation. Pancreas: Normal appearance of the pancreas. Spleen: The spleen is unremarkable. Adrenals/Urinary Tract: The adrenal glands are normal. Bilateral renal cysts are identified. The urinary bladder appears normal. Stomach/Bowel: The stomach appears normal. Small bowel loops have a normal course and caliber. There is no evidence for a bowel obstruction. Circumferential mass involving the pelvic small bowel loop is identified. This measures 5.3 x 3.9 x 4.3 cm and results in moderate narrowing of the small bowel lumen, image 61 of series 2 and image 62 of series 3. There is a large left inguinal hernia which contains a loop of sigmoid colon. Sigmoid colon diverticula noted. Vascular/Lymphatic: Calcified atherosclerotic disease involves the abdominal aorta. No aneurysm. Enlarged lymph node  within the lower abdominal ileocolic mesentery measures 1.4 cm, image 50 of series 2. Reproductive: Mild prostate gland enlargement. Other: No free fluid or fluid collections identified. No peritoneal nodule or mass. Musculoskeletal: Degenerative disc disease noted within the lumbar spine. No aggressive lytic or sclerotic bone lesions. IMPRESSION: 1. Suspicious mass involving a loop of distal small bowel, likely ileum is identified  and worrisome for small bowel neoplasm. This does not resolve then any significant small bowel obstruction. Surgical consultation suggested. 2. Enlarged ileocolic lymph node within the lower abdomen may represent a focus of metastatic adenopathy. 3. Left inguinal hernia contains a loop of sigmoid colon which may account for difficulty with colonoscopy. 4. Aortic atherosclerosis. 5. Hepatic steatosis. Electronically Signed   By: Kerby Moors M.D.   On: 04/30/2015 12:12    ROS: See chart Blood pressure 130/58, pulse 100, temperature 98.2 F (36.8 C), temperature source Oral, resp. rate 20, height 5\' 5"  (1.651 m), weight 55.6 kg (122 lb 9.2 oz), SpO2 98 %. Physical Exam: Pleasant white male in no acute distress. Abdomen is soft and not particularly distended. No rigidity is noted. No masses palpable. He does have a reducible left inguinal hernia.  Assessment/Plan: Impression: Chronic anemia, possible neoplastic process of the distal small bowel Plan: I agree the patient needs to be explored and have this area excised. Temporarily have scheduled patient for exploratory laparotomy on 05/03/2015. He saw cardiology several months ago and was told he may need an echo or stress tests. I have consulted cardiology for further preoperative workup. Will check his hemoglobin and a.m. He should be a clear liquid diet tomorrow.  Caprisha Bridgett A 05/01/2015, 11:27 AM

## 2015-05-02 DIAGNOSIS — E43 Unspecified severe protein-calorie malnutrition: Secondary | ICD-10-CM

## 2015-05-02 DIAGNOSIS — K629 Disease of anus and rectum, unspecified: Secondary | ICD-10-CM

## 2015-05-02 LAB — CBC
HCT: 26.7 % — ABNORMAL LOW (ref 39.0–52.0)
HEMOGLOBIN: 8.4 g/dL — AB (ref 13.0–17.0)
MCH: 28.1 pg (ref 26.0–34.0)
MCHC: 31.5 g/dL (ref 30.0–36.0)
MCV: 89.3 fL (ref 78.0–100.0)
PLATELETS: 220 10*3/uL (ref 150–400)
RBC: 2.99 MIL/uL — AB (ref 4.22–5.81)
RDW: 15.5 % (ref 11.5–15.5)
WBC: 12.2 10*3/uL — AB (ref 4.0–10.5)

## 2015-05-02 LAB — GLUCOSE, CAPILLARY
GLUCOSE-CAPILLARY: 160 mg/dL — AB (ref 65–99)
GLUCOSE-CAPILLARY: 85 mg/dL (ref 65–99)
Glucose-Capillary: 110 mg/dL — ABNORMAL HIGH (ref 65–99)
Glucose-Capillary: 115 mg/dL — ABNORMAL HIGH (ref 65–99)
Glucose-Capillary: 97 mg/dL (ref 65–99)

## 2015-05-02 LAB — BASIC METABOLIC PANEL
ANION GAP: 6 (ref 5–15)
BUN: 15 mg/dL (ref 6–20)
CO2: 27 mmol/L (ref 22–32)
Calcium: 8.7 mg/dL — ABNORMAL LOW (ref 8.9–10.3)
Chloride: 103 mmol/L (ref 101–111)
Creatinine, Ser: 1.33 mg/dL — ABNORMAL HIGH (ref 0.61–1.24)
GFR calc Af Amer: 56 mL/min — ABNORMAL LOW (ref >60)
GFR, EST NON AFRICAN AMERICAN: 48 mL/min — AB (ref >60)
GLUCOSE: 129 mg/dL — AB (ref 65–99)
POTASSIUM: 4.6 mmol/L (ref 3.5–5.1)
Sodium: 136 mmol/L (ref 135–145)

## 2015-05-02 LAB — PREPARE RBC (CROSSMATCH)

## 2015-05-02 LAB — CEA: CEA: 6.4 ng/mL — ABNORMAL HIGH (ref 0.0–4.7)

## 2015-05-02 LAB — MRSA PCR SCREENING: MRSA by PCR: NEGATIVE

## 2015-05-02 MED ORDER — PANTOPRAZOLE SODIUM 40 MG PO TBEC
40.0000 mg | DELAYED_RELEASE_TABLET | Freq: Every day | ORAL | Status: DC
Start: 2015-05-02 — End: 2015-05-12
  Administered 2015-05-02 – 2015-05-12 (×10): 40 mg via ORAL
  Filled 2015-05-02 (×10): qty 1

## 2015-05-02 MED ORDER — POLYETHYLENE GLYCOL 3350 17 G PO PACK
34.0000 g | PACK | Freq: Once | ORAL | Status: AC
  Administered 2015-05-02: 34 g via ORAL
  Filled 2015-05-02: qty 2

## 2015-05-02 MED ORDER — SODIUM CHLORIDE 0.9 % IV SOLN
INTRAVENOUS | Status: DC
  Administered 2015-05-02 (×2): via INTRAVENOUS

## 2015-05-02 MED ORDER — SODIUM CHLORIDE 0.9 % IV SOLN
Freq: Once | INTRAVENOUS | Status: AC
  Administered 2015-05-02: 12:00:00 via INTRAVENOUS

## 2015-05-02 NOTE — Progress Notes (Signed)
Subjective:  No complaints. Eating clear liquid breakfast.   Objective: Vital signs in last 24 hours: Temp:  [98 F (36.7 C)-98.1 F (36.7 C)] 98 F (36.7 C) (12/08 0500) Pulse Rate:  [89-100] 89 (12/08 0500) Resp:  [20] 20 (12/08 0500) BP: (138-140)/(63-74) 138/74 mmHg (12/08 0500) SpO2:  [96 %-97 %] 97 % (12/08 0500) Last BM Date: 04/30/15 General:   Alert,  Well-developed, well-nourished, pleasant and cooperative in NAD Head:  Normocephalic and atraumatic. Eyes:  Sclera clear, no icterus.  Abdomen:  Soft, nontender and nondistended Extremities:  Without clubbing, deformity or edema. Neurologic:  Alert and  oriented x4;  grossly normal neurologically. Skin:  Intact without significant lesions or rashes. Psych:  Alert and cooperative. Normal mood and affect.  Intake/Output from previous day: 12/07 0701 - 12/08 0700 In: 720 [P.O.:720] Out: -  Intake/Output this shift:    Lab Results: CBC  Recent Labs  04/30/15 0814 05/01/15 0624 05/02/15 0619  WBC 9.8 11.5* 12.2*  HGB 8.9* 8.6* 8.4*  HCT 27.7* 26.9* 26.7*  MCV 89.4 89.4 89.3  PLT 212 229 220   BMET  Recent Labs  05/02/15 0619  NA 136  K 4.6  CL 103  CO2 27  GLUCOSE 129*  BUN 15  CREATININE 1.33*  CALCIUM 8.7*   LFTs No results for input(s): BILITOT, BILIDIR, IBILI, ALKPHOS, AST, ALT, PROT, ALBUMIN in the last 72 hours. No results for input(s): LIPASE in the last 72 hours. PT/INR No results for input(s): LABPROT, INR in the last 72 hours.    Imaging Studies: Dg Lumbar Spine Complete  04/27/2015  CLINICAL DATA:  Low back pain.  Recent falls. EXAM: LUMBAR SPINE - COMPLETE 4+ VIEW COMPARISON:  04/04/2015 FINDINGS: There is an L4 body fracture with superior and inferior endplate compression deformities. Height loss is approximately 25% at maximum. An L1 superior endplate fracture is chronic. No subluxation. Spondylosis without focal or advanced disc narrowing. Extensive aortic atherosclerosis. Large stool  volume, especially in the rectum. Osteopenia. IMPRESSION: 1. L4 vertebral body acute fracture with mild height loss. 2. Large stool volume with potential rectal impaction. Electronically Signed   By: Monte Fantasia M.D.   On: 04/27/2015 14:12   Dg Lumbar Spine Complete  04/04/2015  CLINICAL DATA:  Fall 1 week prior.  Low back pain. EXAM: LUMBAR SPINE - COMPLETE 4+ VIEW COMPARISON:  None. FINDINGS: This report assumes 5 non rib-bearing lumbar vertebrae. There is a very mild anterior L1 vertebral body compression deformity of indeterminate chronicity, favor chronic. Remaining lumbar vertebral body heights are preserved. Mild degenerative disc disease at L2-3. Mild spondylosis throughout the remaining lumbar spine. No spondylolisthesis. Mild facet arthropathy. No appreciable foraminal stenosis. No aggressive appearing focal osseous lesions. IMPRESSION: 1. Very mild anterior L1 vertebral compression deformity of indeterminate chronicity, favor chronic. 2. Mild degenerative disc disease and facet arthropathy in the lumbar spine as described. Electronically Signed   By: Ilona Sorrel M.D.   On: 04/04/2015 13:19   Ct Abdomen Pelvis W Contrast  04/30/2015  CLINICAL DATA:  Incomplete colonoscopy. EXAM: CT ABDOMEN AND PELVIS WITH CONTRAST TECHNIQUE: Multidetector CT imaging of the abdomen and pelvis was performed using the standard protocol following bolus administration of intravenous contrast. CONTRAST:  169mL OMNIPAQUE IOHEXOL 300 MG/ML  SOLN COMPARISON:  None. FINDINGS: Lower chest: Chronic appearing pleural thickening with calcification overlies the posterior left lung base. Overlying scar versus atelectasis noted. 3 mm right middle lobe lung nodule is identified, image number 3 of series 6. Hepatobiliary:  Mild diffuse hepatic steatosis. The gallbladder appears normal. No biliary dilatation. Pancreas: Normal appearance of the pancreas. Spleen: The spleen is unremarkable. Adrenals/Urinary Tract: The adrenal glands  are normal. Bilateral renal cysts are identified. The urinary bladder appears normal. Stomach/Bowel: The stomach appears normal. Small bowel loops have a normal course and caliber. There is no evidence for a bowel obstruction. Circumferential mass involving the pelvic small bowel loop is identified. This measures 5.3 x 3.9 x 4.3 cm and results in moderate narrowing of the small bowel lumen, image 61 of series 2 and image 62 of series 3. There is a large left inguinal hernia which contains a loop of sigmoid colon. Sigmoid colon diverticula noted. Vascular/Lymphatic: Calcified atherosclerotic disease involves the abdominal aorta. No aneurysm. Enlarged lymph node within the lower abdominal ileocolic mesentery measures 1.4 cm, image 50 of series 2. Reproductive: Mild prostate gland enlargement. Other: No free fluid or fluid collections identified. No peritoneal nodule or mass. Musculoskeletal: Degenerative disc disease noted within the lumbar spine. No aggressive lytic or sclerotic bone lesions. IMPRESSION: 1. Suspicious mass involving a loop of distal small bowel, likely ileum is identified and worrisome for small bowel neoplasm. This does not resolve then any significant small bowel obstruction. Surgical consultation suggested. 2. Enlarged ileocolic lymph node within the lower abdomen may represent a focus of metastatic adenopathy. 3. Left inguinal hernia contains a loop of sigmoid colon which may account for difficulty with colonoscopy. 4. Aortic atherosclerosis. 5. Hepatic steatosis. Electronically Signed   By: Kerby Moors M.D.   On: 04/30/2015 12:12   Dg Chest Portable 1 View  04/04/2015  CLINICAL DATA:  Shortness of breath.  Fall 1 week ago. EXAM: PORTABLE CHEST - 1 VIEW COMPARISON:  Two-view chest x-ray 02/22/2015. FINDINGS: The heart size is exaggerated by low lung volumes. Emphysematous changes are again noted. Median sternotomy for CABG is evident. The visualized soft tissues and bony thorax are  unremarkable. IMPRESSION: No acute cardiopulmonary disease or significant interval change. Emphysema. Electronically Signed   By: San Morelle M.D.   On: 04/04/2015 11:42   Dg Hips Bilat With Pelvis 3-4 Views  04/04/2015  CLINICAL DATA:  [place] [address] in bathroom 1 week ago, LEFT hip and low back pain, initial encounter, personal history diabetes mellitus, hypertension, TIA, coronary artery disease EXAM: DG HIP (WITH OR WITHOUT PELVIS) 3-4V BILAT COMPARISON:  None. FINDINGS: Diffuse osseous demineralization. Symmetric hip and SI joints, appear preserved. No acute fracture, dislocation, or bone destruction. Scattered atherosclerotic calcifications. Bowel gas is identified extending into the LEFT hemiscrotum compatible with inguinal hernia. IMPRESSION: Osseous demineralization without acute bony abnormalities. LEFT inguinal hernia containing bowel. Electronically Signed   By: Lavonia Dana M.D.   On: 04/04/2015 13:07  [2 weeks]  Assessment: 81 year old male with history of IDA, admitted with Hgb 6.6, heme positive stool, with recent EGD on 02/2015 while inpatient with chronic gastritis and negative H.pylori Colonoscopy was incomplete due to poor prep and torturous colon. Repeat colonoscopy attempted 12/5 with rectal ulcers and occlusion of the upstream colonic lumen, query extrinsic compression and possibly secondary to entrapment by hernia sac.   CT showed suspicious distal small bowel mass with some narrowing of the lumen and enlarged ileocolic lymph node in lower abdomen, left inguinal hernia with loop of sigmoid colon.   Plan: 1. Add PPI. 2. Avoid constipation, given ischemic rectal ulcers like related to constipation. 3. Surgery as planned. 4. Will sign off. Call with any questions or concerns.  Laureen Ochs. [facility] [phone] 12/8/201611:08 AM     LOS: 5 days

## 2015-05-02 NOTE — Progress Notes (Signed)
Appreciate cardiology input. We will proceed with exploratory laparotomy, partial by our suctions [place]. The risks and benefits of the procedure including bleeding, infection, cardiopulmonary difficulties, and the need for blood transfusion were fully explained to the patient and family, who gave informed consent. He will receive 2 units packed red blood cells today. He should remain on clear liquid diet today. He is nothing by mouth after midnight.

## 2015-05-02 NOTE — Progress Notes (Signed)
Patient tolerated first unit of PRBC's without problem. 2nd unit started at this time. Patient tolerated well. Wife at bedside. No complaints voiced.

## 2015-05-02 NOTE — Progress Notes (Signed)
Patient Demographics  [name] [name], is a 81 y.o. male, DOB - 01/28/1931, [id number]  Admit date - 04/27/2015   Admitting Physician Kathie Dike, MD  Outpatient Primary MD for the patient is [name], MD  LOS - 5   Chief Complaint  Patient presents with  . Back Pain       Admission HPI/Brief narrative: 81 year old male with a hx of anemia, DM type 2, HTN, CAD, HLD, and CKD stage III that presents with chronic anemia with occult GI bleeding. Transfused 2 units PRBC,Colonoscopy was performed which revealed an extrinsic mass pushing on the colon. A CT scan  reveals a possible small bowel neoplasm or inflammatory mass in the distal small bowel in the pelvis, plan is for exploratory laparotomy on 12/9 by Dr. Arnoldo Morale, and seen by cardiology, he is intermediate risk for surgery.  Subjective:   [name] today has, No headache, No chest pain, No abdominal pain - No Nausea,No Cough.  Assessment & Plan    Active Problems:   Diabetes mellitus, type 2 (HCC)   Essential hypertension, benign   Symptomatic anemia   Acute renal failure superimposed on stage 3 chronic kidney disease (HCC)   Dehydration   Fecal impaction in rectum (HCC)   Constipation   Falls   Lumbar compression fracture (HCC)   Hyperkalemia   Hyponatremia   BPH (benign prostatic hypertrophy)   Protein-calorie malnutrition, severe   Small bowel mass   Unilateral recurrent inguinal hernia without obstruction or gangrene   Abnormality of rectum   symptomatic iron deficiency anemia  - Transfused 2 units PRBC 12/3,  hemoglobin remained stable, iron 16, ferritin WNL,this  is most likely due to chronic GI blood loss secondary to small bowel mass . - Will be transfused another 2 units PRBC today in anticipation for surgery tomorrow.   possible neoplastic process and distal small bowel - CT abdomen and pelvis with evidence of masslike  lesion in distal small bowel. - Surgical consult greatly appreciated, plan for exploratory laparotomy on 12/9/716 - Cardiology consulted appreciated, agent is intermediate risk for surgery. - Rectal ulcer biopsies are benign, this is likely ischemic and related to constipation.  Diabetes mellitus - CBG controlled on insulin  sliding scale,   Hypertension - acceptable blood pressure, continue to monitor off antihypertensive medication  Hyponatremia/hypokalemia - stable  Fecal impaction/rectal ulcers - resolved with enema - Rectal ulcers biopsy is benign, likely related to constipation  Anorexia - started on Remeron, continue supplement.  CAD - Has any chest pain or shortness of breath  Code Status: Full  Family Communication:  daughter at bedside   Disposition Plan: Pending further work up.   Procedures  Colonoscopy   Consults   Gastroenterology   general surgery   cardiology   Medications  Scheduled Meds: . sodium chloride   Intravenous Once  . sodium chloride   Intravenous Once  . feeding supplement (ENSURE ENLIVE)  237 mL Oral BID BM  . Influenza vac split quadrivalent PF  0.5 mL Intramuscular Tomorrow-1000  . insulin aspart  0-15 Units Subcutaneous TID WC  . insulin aspart  0-5 Units Subcutaneous QHS  . mirtazapine  15 mg Oral QHS  . pneumococcal 23 valent vaccine  0.5 mL  Intramuscular Tomorrow-1000  . polyethylene glycol  17 g Oral Daily  . polyethylene glycol  34 g Oral Once  . sodium chloride  3 mL Intravenous Q12H   Continuous Infusions: . sodium chloride     PRN Meds:.acetaminophen **OR** acetaminophen, HYDROcodone-acetaminophen, ondansetron **OR** ondansetron (ZOFRAN) IV, zolpidem  DVT Prophylaxis  SCDs ,  no chemical DVT prophylaxis given GI bleed   Lab Results  Component Value Date   PLT 220 05/02/2015    Antibiotics   Anti-infectives    None          Objective:   Filed Vitals:   05/01/15 0525 05/01/15 1533 05/01/15 2149  05/02/15 0500  BP: 130/58 139/63 140/71 138/74  Pulse: 100 97 100 89  Temp: 98.2 F (36.8 C) 98.1 F (36.7 C) 98.1 F (36.7 C) 98 F (36.7 C)  TempSrc: Oral Oral Oral Oral  Resp: 20 20 20 20   Height:      Weight:      SpO2: 98% 97% 96% 97%    Wt Readings from Last 3 Encounters:  04/27/15 55.6 kg (122 lb 9.2 oz)  04/04/15 59 kg (130 lb 1.1 oz)  03/21/15 61.78 kg (136 lb 3.2 oz)     Intake/Output Summary (Last 24 hours) at 05/02/15 1012 Last data filed at 05/01/15 1700  Gross per 24 hour  Intake    480 ml  Output      0 ml  Net    480 ml     Physical Exam General: NAD, looks comfortable  Cardiovascular: RRR, S1, S2  Respiratory: clear bilaterally, No wheezing, rales or rhonchi  Abdomen: soft, non tender, no distention , bowel sounds normal  Musculoskeletal: No edema b/l    Data Review   Micro Results No results found for this or any previous visit (from the past 240 hour(s)).  Radiology Reports Dg Lumbar Spine Complete  04/27/2015  CLINICAL DATA:  Low back pain.  Recent falls. EXAM: LUMBAR SPINE - COMPLETE 4+ VIEW COMPARISON:  04/04/2015 FINDINGS: There is an L4 body fracture with superior and inferior endplate compression deformities. Height loss is approximately 25% at maximum. An L1 superior endplate fracture is chronic. No subluxation. Spondylosis without focal or advanced disc narrowing. Extensive aortic atherosclerosis. Large stool volume, especially in the rectum. Osteopenia. IMPRESSION: 1. L4 vertebral body acute fracture with mild height loss. 2. Large stool volume with potential rectal impaction. Electronically Signed   By: Monte Fantasia M.D.   On: 04/27/2015 14:12   Dg Lumbar Spine Complete  04/04/2015  CLINICAL DATA:  Fall 1 week prior.  Low back pain. EXAM: LUMBAR SPINE - COMPLETE 4+ VIEW COMPARISON:  None. FINDINGS: This report assumes 5 non rib-bearing lumbar vertebrae. There is a very mild anterior L1 vertebral body compression deformity of  indeterminate chronicity, favor chronic. Remaining lumbar vertebral body heights are preserved. Mild degenerative disc disease at L2-3. Mild spondylosis throughout the remaining lumbar spine. No spondylolisthesis. Mild facet arthropathy. No appreciable foraminal stenosis. No aggressive appearing focal osseous lesions. IMPRESSION: 1. Very mild anterior L1 vertebral compression deformity of indeterminate chronicity, favor chronic. 2. Mild degenerative disc disease and facet arthropathy in the lumbar spine as described. Electronically Signed   By: Ilona Sorrel M.D.   On: 04/04/2015 13:19   Ct Abdomen Pelvis W Contrast  04/30/2015  CLINICAL DATA:  Incomplete colonoscopy. EXAM: CT ABDOMEN AND PELVIS WITH CONTRAST TECHNIQUE: Multidetector CT imaging of the abdomen and pelvis was performed using the standard protocol following bolus administration of  intravenous contrast. CONTRAST:  158mL OMNIPAQUE IOHEXOL 300 MG/ML  SOLN COMPARISON:  None. FINDINGS: Lower chest: Chronic appearing pleural thickening with calcification overlies the posterior left lung base. Overlying scar versus atelectasis noted. 3 mm right middle lobe lung nodule is identified, image number 3 of series 6. Hepatobiliary: Mild diffuse hepatic steatosis. The gallbladder appears normal. No biliary dilatation. Pancreas: Normal appearance of the pancreas. Spleen: The spleen is unremarkable. Adrenals/Urinary Tract: The adrenal glands are normal. Bilateral renal cysts are identified. The urinary bladder appears normal. Stomach/Bowel: The stomach appears normal. Small bowel loops have a normal course and caliber. There is no evidence for a bowel obstruction. Circumferential mass involving the pelvic small bowel loop is identified. This measures 5.3 x 3.9 x 4.3 cm and results in moderate narrowing of the small bowel lumen, image 61 of series 2 and image 62 of series 3. There is a large left inguinal hernia which contains a loop of sigmoid colon. Sigmoid colon  diverticula noted. Vascular/Lymphatic: Calcified atherosclerotic disease involves the abdominal aorta. No aneurysm. Enlarged lymph node within the lower abdominal ileocolic mesentery measures 1.4 cm, image 50 of series 2. Reproductive: Mild prostate gland enlargement. Other: No free fluid or fluid collections identified. No peritoneal nodule or mass. Musculoskeletal: Degenerative disc disease noted within the lumbar spine. No aggressive lytic or sclerotic bone lesions. IMPRESSION: 1. Suspicious mass involving a loop of distal small bowel, likely ileum is identified and worrisome for small bowel neoplasm. This does not resolve then any significant small bowel obstruction. Surgical consultation suggested. 2. Enlarged ileocolic lymph node within the lower abdomen may represent a focus of metastatic adenopathy. 3. Left inguinal hernia contains a loop of sigmoid colon which may account for difficulty with colonoscopy. 4. Aortic atherosclerosis. 5. Hepatic steatosis. Electronically Signed   By: Kerby Moors M.D.   On: 04/30/2015 12:12   Dg Chest Portable 1 View  04/04/2015  CLINICAL DATA:  Shortness of breath.  Fall 1 week ago. EXAM: PORTABLE CHEST - 1 VIEW COMPARISON:  Two-view chest x-ray 02/22/2015. FINDINGS: The heart size is exaggerated by low lung volumes. Emphysematous changes are again noted. Median sternotomy for CABG is evident. The visualized soft tissues and bony thorax are unremarkable. IMPRESSION: No acute cardiopulmonary disease or significant interval change. Emphysema. Electronically Signed   By: San Morelle M.D.   On: 04/04/2015 11:42   Dg Hips Bilat With Pelvis 3-4 Views  04/04/2015  CLINICAL DATA:  [place] [address] in bathroom 1 week ago, LEFT hip and low back pain, initial encounter, personal history diabetes mellitus, hypertension, TIA, coronary artery disease EXAM: DG HIP (WITH OR WITHOUT PELVIS) 3-4V BILAT COMPARISON:  None. FINDINGS: Diffuse osseous demineralization. Symmetric hip and SI  joints, appear preserved. No acute fracture, dislocation, or bone destruction. Scattered atherosclerotic calcifications. Bowel gas is identified extending into the LEFT hemiscrotum compatible with inguinal hernia. IMPRESSION: Osseous demineralization without acute bony abnormalities. LEFT inguinal hernia containing bowel. Electronically Signed   By: Lavonia Dana M.D.   On: 04/04/2015 13:07     CBC  Recent Labs Lab 04/27/15 1221 04/28/15 0541 04/29/15 0621 04/30/15 0814 05/01/15 0624 05/02/15 0619  WBC 14.1* 11.9* 12.9* 9.8 11.5* 12.2*  HGB 6.6* 8.6* 8.5* 8.9* 8.6* 8.4*  HCT 20.8* 26.1* 26.2* 27.7* 26.9* 26.7*  PLT 271 238 249 212 229 220  MCV 89.3 87.9 87.9 89.4 89.4 89.3  MCH 28.3 29.0 28.5 28.7 28.6 28.1  MCHC 31.7 33.0 32.4 32.1 32.0 31.5  RDW 16.8* 15.4 15.6* 15.7* 15.7* 15.5  LYMPHSABS 0.6*  --   --   --   --   --   MONOABS 0.6  --   --   --   --   --   EOSABS 0.2  --   --   --   --   --   BASOSABS 0.0  --   --   --   --   --     Chemistries   Recent Labs Lab 04/27/15 1221 04/28/15 0541 04/29/15 0621 05/02/15 0619  NA 130* 134* 134* 136  K 5.2* 4.7 4.3 4.6  CL 96* 103 104 103  CO2 23 24 24 27   GLUCOSE 237* 101* 126* 129*  BUN 31* 23* 15 15  CREATININE 2.10* 1.61* 1.23 1.33*  CALCIUM 8.6* 8.2* 7.9* 8.7*  AST  --  9*  --   --   ALT  --  11*  --   --   ALKPHOS  --  53  --   --   BILITOT  --  0.8  --   --    ------------------------------------------------------------------------------------------------------------------ estimated creatinine clearance is 33.7 mL/min (by C-G formula based on Cr of 1.33). ------------------------------------------------------------------------------------------------------------------ No results for input(s): HGBA1C in the last 72 hours. ------------------------------------------------------------------------------------------------------------------ No results for input(s): CHOL, HDL, LDLCALC, TRIG, CHOLHDL, LDLDIRECT in the last  72 hours. ------------------------------------------------------------------------------------------------------------------ No results for input(s): TSH, T4TOTAL, T3FREE, THYROIDAB in the last 72 hours.  Invalid input(s): FREET3 ------------------------------------------------------------------------------------------------------------------ No results for input(s): VITAMINB12, FOLATE, FERRITIN, TIBC, IRON, RETICCTPCT in the last 72 hours.  Coagulation profile No results for input(s): INR, PROTIME in the last 168 hours.  No results for input(s): DDIMER in the last 72 hours.  Cardiac Enzymes No results for input(s): CKMB, TROPONINI, MYOGLOBIN in the last 168 hours.  Invalid input(s): CK ------------------------------------------------------------------------------------------------------------------ Invalid input(s): POCBNP     Time Spent in minutes   25 minutes   Naomy Esham M.D on 05/02/2015 at 10:12 AM  Between 7am to 7pm - Pager - [phone]  After 7pm go to [email or web address] - password [email or web address]  [facility]   Office  [phone]

## 2015-05-02 NOTE — Care Management Note (Signed)
Case Management Note  Patient Details  Name: [name] [name] MRN: [id number] Date of Birth: 09/03/1930  Expected Discharge Date:  04/29/15               Expected Discharge Plan:  [place]  In-House Referral:  NA  Discharge planning Services  CM Consult  Post Acute Care Choice:  Home Health Choice offered to:  Patient  DME Arranged:    DME Agency:     HH Arranged:  RN, PT [place] Agency:  [place]  Status of Service:  In process, will continue to follow  Medicare Important Message Given:    Date Medicare IM Given:    Medicare IM give by:    Date Additional Medicare IM Given:    Additional Medicare Important Message give by:     If discussed at [place] of Stay Meetings, dates discussed:  05/02/2015  Additional Comments:  Sherald Barge, RN 05/02/2015, 2:00 PM

## 2015-05-03 ENCOUNTER — Encounter (HOSPITAL_COMMUNITY)
Admission: EM | Disposition: A | Discharge status: Skilled Nursing Facility | Payer: Self-pay | Source: Home / Self Care | Attending: Internal Medicine

## 2015-05-03 ENCOUNTER — Encounter (HOSPITAL_COMMUNITY): Payer: Self-pay | Admitting: *Deleted

## 2015-05-03 ENCOUNTER — Inpatient Hospital Stay (HOSPITAL_COMMUNITY): Payer: Medicare Other | Admitting: Anesthesiology

## 2015-05-03 DIAGNOSIS — K626 Ulcer of anus and rectum: Secondary | ICD-10-CM

## 2015-05-03 HISTORY — PX: BOWEL RESECTION: SHX1257

## 2015-05-03 LAB — BASIC METABOLIC PANEL
Anion gap: 8 (ref 5–15)
BUN: 16 mg/dL (ref 6–20)
CHLORIDE: 99 mmol/L — AB (ref 101–111)
CO2: 27 mmol/L (ref 22–32)
Calcium: 8.8 mg/dL — ABNORMAL LOW (ref 8.9–10.3)
Creatinine, Ser: 1.41 mg/dL — ABNORMAL HIGH (ref 0.61–1.24)
GFR calc Af Amer: 52 mL/min — ABNORMAL LOW (ref >60)
GFR, EST NON AFRICAN AMERICAN: 45 mL/min — AB (ref >60)
GLUCOSE: 120 mg/dL — AB (ref 65–99)
POTASSIUM: 4.3 mmol/L (ref 3.5–5.1)
Sodium: 134 mmol/L — ABNORMAL LOW (ref 135–145)

## 2015-05-03 LAB — GLUCOSE, CAPILLARY
GLUCOSE-CAPILLARY: 127 mg/dL — AB (ref 65–99)
Glucose-Capillary: 121 mg/dL — ABNORMAL HIGH (ref 65–99)
Glucose-Capillary: 103 mg/dL — ABNORMAL HIGH (ref 65–99)
Glucose-Capillary: 112 mg/dL — ABNORMAL HIGH (ref 65–99)
Glucose-Capillary: 148 mg/dL — ABNORMAL HIGH (ref 65–99)
Glucose-Capillary: 144 mg/dL — ABNORMAL HIGH (ref 65–99)

## 2015-05-03 LAB — CBC
HCT: 35.1 % — ABNORMAL LOW (ref 39.0–52.0)
HEMATOCRIT: 33.1 % — AB (ref 39.0–52.0)
Hemoglobin: 11.5 g/dL — ABNORMAL LOW (ref 13.0–17.0)
Hemoglobin: 10.9 g/dL — ABNORMAL LOW (ref 13.0–17.0)
MCH: 28.5 pg (ref 26.0–34.0)
MCH: 28.3 pg (ref 26.0–34.0)
MCHC: 32.8 g/dL (ref 30.0–36.0)
MCHC: 32.9 g/dL (ref 30.0–36.0)
MCV: 86.9 fL (ref 78.0–100.0)
MCV: 86 fL (ref 78.0–100.0)
PLATELETS: 236 10*3/uL (ref 150–400)
Platelets: 198 10*3/uL (ref 150–400)
RBC: 4.04 MIL/uL — AB (ref 4.22–5.81)
RBC: 3.85 MIL/uL — ABNORMAL LOW (ref 4.22–5.81)
RDW: 16.6 % — ABNORMAL HIGH (ref 11.5–15.5)
RDW: 16.5 % — AB (ref 11.5–15.5)
WBC: 13.2 10*3/uL — ABNORMAL HIGH (ref 4.0–10.5)
WBC: 11.7 10*3/uL — ABNORMAL HIGH (ref 4.0–10.5)

## 2015-05-03 LAB — PREPARE RBC (CROSSMATCH)

## 2015-05-03 SURGERY — EXCISION, SMALL INTESTINE
Anesthesia: General

## 2015-05-03 MED ORDER — GLYCOPYRROLATE 0.2 MG/ML IJ SOLN
INTRAMUSCULAR | Status: AC
  Filled 2015-05-03: qty 3

## 2015-05-03 MED ORDER — NEOSTIGMINE METHYLSULFATE 10 MG/10ML IV SOLN
INTRAVENOUS | Status: DC | PRN
  Administered 2015-05-03: 4 mg via INTRAVENOUS

## 2015-05-03 MED ORDER — NALOXONE HCL 0.4 MG/ML IJ SOLN
INTRAMUSCULAR | Status: AC
  Filled 2015-05-03: qty 1

## 2015-05-03 MED ORDER — POVIDONE-IODINE 10 % EX OINT
TOPICAL_OINTMENT | CUTANEOUS | Status: AC
  Filled 2015-05-03: qty 1

## 2015-05-03 MED ORDER — FENTANYL CITRATE (PF) 250 MCG/5ML IJ SOLN
INTRAMUSCULAR | Status: AC
  Filled 2015-05-03: qty 5

## 2015-05-03 MED ORDER — ENOXAPARIN SODIUM 30 MG/0.3ML ~~LOC~~ SOLN
30.0000 mg | SUBCUTANEOUS | Status: DC
  Administered 2015-05-04 – 2015-05-07 (×4): 30 mg via SUBCUTANEOUS
  Filled 2015-05-03 (×4): qty 0.3

## 2015-05-03 MED ORDER — METRONIDAZOLE IN NACL 5-0.79 MG/ML-% IV SOLN
500.0000 mg | INTRAVENOUS | Status: AC
  Administered 2015-05-03: 500 mg via INTRAVENOUS

## 2015-05-03 MED ORDER — LACTATED RINGERS IV SOLN
INTRAVENOUS | Status: DC
  Administered 2015-05-03 (×2): via INTRAVENOUS

## 2015-05-03 MED ORDER — METOPROLOL TARTRATE 1 MG/ML IV SOLN
INTRAVENOUS | Status: AC
  Filled 2015-05-03: qty 5

## 2015-05-03 MED ORDER — SODIUM CHLORIDE 0.9 % IV SOLN: Freq: Once | INTRAVENOUS | Status: DC

## 2015-05-03 MED ORDER — MORPHINE SULFATE (PF) 2 MG/ML IV SOLN
2.0000 mg | INTRAVENOUS | Status: DC | PRN
  Administered 2015-05-03 – 2015-05-05 (×6): 2 mg via INTRAVENOUS
  Filled 2015-05-03 (×6): qty 1

## 2015-05-03 MED ORDER — SODIUM CHLORIDE 0.9 % IJ SOLN
INTRAMUSCULAR | Status: AC
  Filled 2015-05-03: qty 10

## 2015-05-03 MED ORDER — GLYCOPYRROLATE 0.2 MG/ML IJ SOLN
INTRAMUSCULAR | Status: AC
  Filled 2015-05-03: qty 1

## 2015-05-03 MED ORDER — ALVIMOPAN 12 MG PO CAPS
12.0000 mg | ORAL_CAPSULE | Freq: Once | ORAL | Status: AC
  Administered 2015-05-03: 12 mg via ORAL
  Filled 2015-05-03: qty 1

## 2015-05-03 MED ORDER — FENTANYL CITRATE (PF) 100 MCG/2ML IJ SOLN
INTRAMUSCULAR | Status: DC | PRN
  Administered 2015-05-03 (×4): 50 ug via INTRAVENOUS

## 2015-05-03 MED ORDER — BUPIVACAINE HCL (PF) 0.5 % IJ SOLN
INTRAMUSCULAR | Status: AC
  Filled 2015-05-03: qty 30

## 2015-05-03 MED ORDER — ONDANSETRON HCL 4 MG/2ML IJ SOLN
INTRAMUSCULAR | Status: AC
  Filled 2015-05-03: qty 2

## 2015-05-03 MED ORDER — SODIUM CHLORIDE 0.9 % IR SOLN
Status: DC | PRN
  Administered 2015-05-03: 2000 mL

## 2015-05-03 MED ORDER — LACTATED RINGERS IV SOLN
INTRAVENOUS | Status: DC
  Administered 2015-05-03: 12:00:00 via INTRAVENOUS

## 2015-05-03 MED ORDER — MORPHINE SULFATE (PF) 2 MG/ML IV SOLN
2.0000 mg | INTRAVENOUS | Status: DC | PRN
  Administered 2015-05-03: 2 mg via INTRAVENOUS
  Filled 2015-05-03: qty 1

## 2015-05-03 MED ORDER — ROCURONIUM BROMIDE 100 MG/10ML IV SOLN
INTRAVENOUS | Status: DC | PRN
  Administered 2015-05-03: 5 mg via INTRAVENOUS
  Administered 2015-05-03: 15 mg via INTRAVENOUS

## 2015-05-03 MED ORDER — SIMETHICONE 80 MG PO CHEW: 40.0000 mg | CHEWABLE_TABLET | Freq: Four times a day (QID) | ORAL | Status: DC | PRN

## 2015-05-03 MED ORDER — METOPROLOL TARTRATE 1 MG/ML IV SOLN
INTRAVENOUS | Status: DC | PRN
  Administered 2015-05-03 (×2): 1 mg via INTRAVENOUS

## 2015-05-03 MED ORDER — GLYCOPYRROLATE 0.2 MG/ML IJ SOLN
INTRAMUSCULAR | Status: DC | PRN
  Administered 2015-05-03: 0.6 mg via INTRAVENOUS

## 2015-05-03 MED ORDER — SODIUM CHLORIDE 0.9 % IV SOLN
INTRAVENOUS | Status: DC
  Administered 2015-05-03 – 2015-05-06 (×6): via INTRAVENOUS
  Administered 2015-05-07: 75 mL/h via INTRAVENOUS
  Administered 2015-05-08: 02:00:00 via INTRAVENOUS

## 2015-05-03 MED ORDER — MIDAZOLAM HCL 2 MG/2ML IJ SOLN
1.0000 mg | INTRAMUSCULAR | Status: DC | PRN
  Administered 2015-05-03: 2 mg via INTRAVENOUS

## 2015-05-03 MED ORDER — ONDANSETRON HCL 4 MG/2ML IJ SOLN
4.0000 mg | Freq: Once | INTRAMUSCULAR | Status: AC
  Administered 2015-05-03: 4 mg via INTRAVENOUS

## 2015-05-03 MED ORDER — MIDAZOLAM HCL 5 MG/5ML IJ SOLN
INTRAMUSCULAR | Status: DC | PRN
  Administered 2015-05-03: 2 mg via INTRAVENOUS

## 2015-05-03 MED ORDER — METRONIDAZOLE IN NACL 5-0.79 MG/ML-% IV SOLN
INTRAVENOUS | Status: AC
  Filled 2015-05-03: qty 100

## 2015-05-03 MED ORDER — CIPROFLOXACIN IN D5W 400 MG/200ML IV SOLN
400.0000 mg | INTRAVENOUS | Status: AC
  Administered 2015-05-03: 400 mg via INTRAVENOUS

## 2015-05-03 MED ORDER — MIDAZOLAM HCL 2 MG/2ML IJ SOLN
INTRAMUSCULAR | Status: AC
  Filled 2015-05-03: qty 2

## 2015-05-03 MED ORDER — ONDANSETRON HCL 4 MG/2ML IJ SOLN: 4.0000 mg | Freq: Once | INTRAMUSCULAR | Status: DC | PRN

## 2015-05-03 MED ORDER — CLONAZEPAM 0.5 MG PO TABS
1.0000 mg | ORAL_TABLET | Freq: Every day | ORAL | Status: DC
  Administered 2015-05-03 – 2015-05-11 (×9): 1 mg via ORAL
  Filled 2015-05-03 (×9): qty 2

## 2015-05-03 MED ORDER — ETOMIDATE 2 MG/ML IV SOLN
INTRAVENOUS | Status: DC | PRN
  Administered 2015-05-03: 10 mg via INTRAVENOUS

## 2015-05-03 MED ORDER — FENTANYL CITRATE (PF) 100 MCG/2ML IJ SOLN
25.0000 ug | INTRAMUSCULAR | Status: DC | PRN
  Administered 2015-05-03 (×4): 50 ug via INTRAVENOUS
  Filled 2015-05-03 (×2): qty 2

## 2015-05-03 MED ORDER — EPHEDRINE SULFATE 50 MG/ML IJ SOLN
INTRAMUSCULAR | Status: AC
  Filled 2015-05-03: qty 1

## 2015-05-03 MED ORDER — LIDOCAINE HCL (CARDIAC) 20 MG/ML IV SOLN
INTRAVENOUS | Status: DC | PRN
  Administered 2015-05-03: 50 mg via INTRAVENOUS

## 2015-05-03 MED ORDER — BUPIVACAINE HCL (PF) 0.5 % IJ SOLN
INTRAMUSCULAR | Status: DC | PRN
  Administered 2015-05-03: 10 mL

## 2015-05-03 MED ORDER — HALOPERIDOL LACTATE 5 MG/ML IJ SOLN
1.0000 mg | INTRAMUSCULAR | Status: DC | PRN
  Administered 2015-05-03: 1 mg via INTRAVENOUS
  Filled 2015-05-03: qty 1

## 2015-05-03 MED ORDER — BUPIVACAINE LIPOSOME 1.3 % IJ SUSP
INTRAMUSCULAR | Status: AC
  Filled 2015-05-03: qty 20

## 2015-05-03 MED ORDER — POVIDONE-IODINE 10 % OINT PACKET
TOPICAL_OINTMENT | CUTANEOUS | Status: DC | PRN
  Administered 2015-05-03: 1 via TOPICAL

## 2015-05-03 MED ORDER — HYDRALAZINE HCL 20 MG/ML IJ SOLN
5.0000 mg | Freq: Four times a day (QID) | INTRAMUSCULAR | Status: DC | PRN
  Administered 2015-05-03 – 2015-05-05 (×2): 5 mg via INTRAVENOUS
  Filled 2015-05-03 (×2): qty 1

## 2015-05-03 MED ORDER — SUCCINYLCHOLINE CHLORIDE 20 MG/ML IJ SOLN
INTRAMUSCULAR | Status: DC | PRN
  Administered 2015-05-03: 100 mg via INTRAVENOUS

## 2015-05-03 MED ORDER — CIPROFLOXACIN IN D5W 400 MG/200ML IV SOLN
INTRAVENOUS | Status: AC
  Filled 2015-05-03: qty 200

## 2015-05-03 MED ORDER — GLYCOPYRROLATE 0.2 MG/ML IJ SOLN
0.2000 mg | Freq: Once | INTRAMUSCULAR | Status: AC
  Administered 2015-05-03: 0.2 mg via INTRAVENOUS

## 2015-05-03 SURGICAL SUPPLY — 40 items
BAG HAMPER (MISCELLANEOUS) ×3 IMPLANT
CHLORAPREP W/TINT 26ML (MISCELLANEOUS) ×3 IMPLANT
CLOTH BEACON ORANGE TIMEOUT ST (SAFETY) ×3 IMPLANT
COVER LIGHT HANDLE STERIS (MISCELLANEOUS) ×6 IMPLANT
DECANTER SPIKE VIAL GLASS SM (MISCELLANEOUS) ×3 IMPLANT
DRAPE WARM FLUID 44X44 (DRAPE) ×3 IMPLANT
DRSG OPSITE POSTOP 4X8 (GAUZE/BANDAGES/DRESSINGS) ×3 IMPLANT
ELECT REM PT RETURN 9FT ADLT (ELECTROSURGICAL) ×3
ELECTRODE REM PT RTRN 9FT ADLT (ELECTROSURGICAL) ×1 IMPLANT
GLOVE BIO SURGEON STRL SZ7 (GLOVE) ×9 IMPLANT
GLOVE BIO SURGEON STRL SZ8 (GLOVE) ×3 IMPLANT
GLOVE BIOGEL PI IND STRL 7.0 (GLOVE) ×4 IMPLANT
GLOVE BIOGEL PI IND STRL 8 (GLOVE) ×1 IMPLANT
GLOVE BIOGEL PI INDICATOR 7.0 (GLOVE) ×8
GLOVE BIOGEL PI INDICATOR 8 (GLOVE) ×2
GLOVE ECLIPSE 7.0 STRL STRAW (GLOVE) ×3 IMPLANT
GLOVE ECLIPSE 8.0 STRL XLNG CF (GLOVE) ×3 IMPLANT
GLOVE SURG SS PI 7.5 STRL IVOR (GLOVE) ×9 IMPLANT
GOWN STRL REUS W/ TWL XL LVL3 (GOWN DISPOSABLE) ×1 IMPLANT
GOWN STRL REUS W/TWL LRG LVL3 (GOWN DISPOSABLE) ×9 IMPLANT
GOWN STRL REUS W/TWL XL LVL3 (GOWN DISPOSABLE) ×2
INST SET MAJOR GENERAL (KITS) ×3 IMPLANT
KIT ROOM TURNOVER APOR (KITS) ×3 IMPLANT
LIGASURE IMPACT 36 18CM CVD LR (INSTRUMENTS) ×3 IMPLANT
MANIFOLD NEPTUNE II (INSTRUMENTS) ×3 IMPLANT
NEEDLE HYPO 25X1 1.5 SAFETY (NEEDLE) ×3 IMPLANT
NS IRRIG 1000ML POUR BTL (IV SOLUTION) ×6 IMPLANT
PACK ABDOMINAL MAJOR (CUSTOM PROCEDURE TRAY) ×3 IMPLANT
PAD ARMBOARD 7.5X6 YLW CONV (MISCELLANEOUS) ×3 IMPLANT
RELOAD PROXIMATE 75MM BLUE (ENDOMECHANICALS) ×6 IMPLANT
STAPLER GUN LINEAR PROX 60 (STAPLE) ×3 IMPLANT
STAPLER PROXIMATE 75MM BLUE (STAPLE) ×3 IMPLANT
STAPLER VISISTAT (STAPLE) ×3 IMPLANT
SUCTION POOLE TIP (SUCTIONS) ×3 IMPLANT
SUT CHROMIC 2 0 SH (SUTURE) ×3 IMPLANT
SUT PDS AB 0 CTX 60 (SUTURE) ×6 IMPLANT
SUT SILK 2 0 SH (SUTURE) ×3 IMPLANT
SUT SILK 3 0 SH CR/8 (SUTURE) ×3 IMPLANT
SYR CONTROL 10ML LL (SYRINGE) ×3 IMPLANT
TRAY FOLEY CATH SILVER 16FR (SET/KITS/TRAYS/PACK) ×3 IMPLANT

## 2015-05-03 NOTE — Anesthesia Procedure Notes (Signed)
Procedure Name: Intubation Date/Time: 05/03/2015 12:09 PM Performed by: Andree Elk, AMY A Pre-anesthesia Checklist: Patient identified, Patient being monitored, Timeout performed, Emergency Drugs available and Suction available Patient Re-evaluated:Patient Re-evaluated prior to inductionOxygen Delivery Method: Circle System Utilized Preoxygenation: Pre-oxygenation with 100% oxygen Intubation Type: IV induction, Rapid sequence and Cricoid Pressure applied Laryngoscope Size: 3 and Glidescope Grade View: Grade I Tube type: Oral Tube size: 7.0 mm Number of attempts: 1 Airway Equipment and Method: Stylet and Video-laryngoscopy Placement Confirmation: ETT inserted through vocal cords under direct vision,  positive ETCO2 and breath sounds checked- equal and bilateral Secured at: 21 cm Tube secured with: Tape Dental Injury: Teeth and Oropharynx as per pre-operative assessment

## 2015-05-03 NOTE — Transfer of Care (Signed)
Immediate Anesthesia Transfer of Care Note  Patient: [name] [name]  Procedure(s) Performed: Procedure(s):  PARTIAL SMALL BOWEL RESECTION (N/A)  Patient Location: PACU  Anesthesia Type:General  Level of Consciousness: awake and patient cooperative  Airway & Oxygen Therapy: Patient Spontanous Breathing and Patient connected to face mask oxygen  Post-op Assessment: Report given to RN and Post -op Vital signs reviewed and stable  Post vital signs: Reviewed and stable  Last Vitals:  Filed Vitals:   05/03/15 1150 05/03/15 1155  BP: 137/77 129/68  Pulse:    Temp:    Resp: 15 19    Complications: No apparent anesthesia complications

## 2015-05-03 NOTE — Progress Notes (Signed)
Patient Demographics  [name] [name], is a 81 y.o. male, DOB - 02-14-1931, [id number]  Admit date - 04/27/2015   Admitting Physician [name] Dike, MD  Outpatient Primary MD for the patient is [name] Neighbors, MD  LOS - 6   Chief Complaint  Patient presents with  . Back Pain       Admission HPI/Brief narrative: 81 year old male with a hx of anemia, DM type 2, HTN, CAD, HLD, and CKD stage III that presents with chronic anemia with occult GI bleeding. Transfused 2 units PRBC,Colonoscopy was performed which revealed an extrinsic mass pushing on the colon. A CT scan  reveals a possible small bowel neoplasm or inflammatory mass in the distal small bowel in the pelvis, plan is for exploratory laparotomy on 12/9 by [name] [name], and seen by cardiology, he is intermediate risk for surgery.  Subjective:   [name] [name] today has, No headache, No chest pain, No abdominal pain - No Nausea,No Cough.  Assessment & Plan    Active Problems:   Diabetes mellitus, type 2 (HCC)   Essential hypertension, benign   Symptomatic anemia   Acute renal failure superimposed on stage 3 chronic kidney disease (HCC)   Dehydration   Fecal impaction in rectum (HCC)   Constipation   Falls   Lumbar compression fracture (HCC)   Hyperkalemia   Hyponatremia   BPH (benign prostatic hypertrophy)   Protein-calorie malnutrition, severe   Small bowel mass   Unilateral recurrent inguinal hernia without obstruction or gangrene   Abnormality of rectum   symptomatic iron deficiency anemia  - Transfused 2 units PRBC 12/3,  hemoglobin remained stable, iron 16, ferritin WNL,this  is most likely due to chronic GI blood loss secondary to small bowel mass . -  transfused another 2 units PRBC 12/8 in anticipation for surgery today.   possible neoplastic process and distal small bowel - CT abdomen and pelvis with evidence of masslike lesion in  distal small bowel. - Surgical consult greatly appreciated, plan for exploratory laparotomy on 05/03/2015 - Cardiology consulted appreciated, patient is intermediate risk for surgery. - Rectal ulcer biopsies are benign, this is likely ischemic and related to constipation.  Diabetes mellitus - CBG controlled on insulin  sliding scale,   Hypertension - Blood pressure started to increase, will start on when necessary hydralazine, will resume back on home medications after surgery.  Hyponatremia/hypokalemia - stable  Fecal impaction/rectal ulcers - resolved with enema - Rectal ulcers biopsy is benign, likely related to constipation  Anorexia - started on Remeron, continue supplement.  CAD - Has any chest pain or shortness of breath  Chronic kidney disease stage III - Continue to monitor, at baseline  Code Status: Full  Family Communication:  none at bedside   Disposition Plan: Pending further work up.   Procedures  Colonoscopy   Consults   Gastroenterology   general surgery   cardiology   Medications  Scheduled Meds: . alvimopan  12 mg Oral Once  . feeding supplement (ENSURE ENLIVE)  237 mL Oral BID BM  . Influenza vac split quadrivalent PF  0.5 mL Intramuscular Tomorrow-1000  . insulin aspart  0-15 Units Subcutaneous TID WC  . mirtazapine  15 mg Oral QHS  . pantoprazole  40  mg Oral Daily  . pneumococcal 23 valent vaccine  0.5 mL Intramuscular Tomorrow-1000  . polyethylene glycol  17 g Oral Daily  . sodium chloride  3 mL Intravenous Q12H   Continuous Infusions: . sodium chloride 50 mL/hr at 05/02/15 1919   PRN Meds:.acetaminophen **OR** acetaminophen, HYDROcodone-acetaminophen, ondansetron **OR** ondansetron (ZOFRAN) IV, zolpidem  DVT Prophylaxis  SCDs ,  no chemical DVT prophylaxis given GI bleed   Lab Results  Component Value Date   PLT 198 05/03/2015    Antibiotics   Anti-infectives    None          Objective:   Filed Vitals:   05/02/15  1543 05/02/15 1800 05/02/15 2149 05/03/15 0553  BP: 123/54 145/63 162/69 169/79  Pulse: 87 103 117 109  Temp: 98.5 F (36.9 C) 97.9 F (36.6 C) 99.2 F (37.3 C) 98 F (36.7 C)  TempSrc:  Oral Oral Oral  Resp: 16 16 20 20   Height:      Weight:      SpO2: 97% 99% 99% 99%    Wt Readings from Last 3 Encounters:  04/27/15 55.6 kg (122 lb 9.2 oz)  04/04/15 59 kg (130 lb 1.1 oz)  03/21/15 61.78 kg (136 lb 3.2 oz)     Intake/Output Summary (Last 24 hours) at 05/03/15 0946 Last data filed at 05/03/15 0554  Gross per 24 hour  Intake    374 ml  Output    100 ml  Net    274 ml     Physical Exam General: NAD, looks comfortable  Cardiovascular: RRR, S1, S2  Respiratory: clear bilaterally, No wheezing, rales or rhonchi  Abdomen: soft, non tender, no distention , bowel sounds normal  Musculoskeletal: No edema b/l    Data Review   Micro Results Recent Results (from the past 240 hour(s))  MRSA PCR Screening     Status: None   Collection Time: 05/02/15 11:45 AM  Result Value Ref Range Status   MRSA by PCR NEGATIVE NEGATIVE Final    Comment:        The GeneXpert MRSA Assay (FDA approved for NASAL specimens only), is one component of a comprehensive MRSA colonization surveillance program. It is not intended to diagnose MRSA infection nor to guide or monitor treatment for MRSA infections.     Radiology Reports Dg Lumbar Spine Complete  04/27/2015  CLINICAL DATA:  Low back pain.  Recent falls. EXAM: LUMBAR SPINE - COMPLETE 4+ VIEW COMPARISON:  04/04/2015 FINDINGS: There is an L4 body fracture with superior and inferior endplate compression deformities. Height loss is approximately 25% at maximum. An L1 superior endplate fracture is chronic. No subluxation. Spondylosis without focal or advanced disc narrowing. Extensive aortic atherosclerosis. Large stool volume, especially in the rectum. Osteopenia. IMPRESSION: 1. L4 vertebral body acute fracture with mild height loss. 2.  Large stool volume with potential rectal impaction. Electronically Signed   By: [name] [name] M.D.   On: 04/27/2015 14:12   Dg Lumbar Spine Complete  04/04/2015  CLINICAL DATA:  Fall 1 week prior.  Low back pain. EXAM: LUMBAR SPINE - COMPLETE 4+ VIEW COMPARISON:  None. FINDINGS: This report assumes 5 non rib-bearing lumbar vertebrae. There is a very mild anterior L1 vertebral body compression deformity of indeterminate chronicity, favor chronic. Remaining lumbar vertebral body heights are preserved. Mild degenerative disc disease at L2-3. Mild spondylosis throughout the remaining lumbar spine. No spondylolisthesis. Mild facet arthropathy. No appreciable foraminal stenosis. No aggressive appearing focal osseous lesions. IMPRESSION: 1. Very mild  anterior L1 vertebral compression deformity of indeterminate chronicity, favor chronic. 2. Mild degenerative disc disease and facet arthropathy in the lumbar spine as described. Electronically Signed   By: Ilona Sorrel M.D.   On: 04/04/2015 13:19   Ct Abdomen Pelvis W Contrast  04/30/2015  CLINICAL DATA:  Incomplete colonoscopy. EXAM: CT ABDOMEN AND PELVIS WITH CONTRAST TECHNIQUE: Multidetector CT imaging of the abdomen and pelvis was performed using the standard protocol following bolus administration of intravenous contrast. CONTRAST:  130mL OMNIPAQUE IOHEXOL 300 MG/ML  SOLN COMPARISON:  None. FINDINGS: Lower chest: Chronic appearing pleural thickening with calcification overlies the posterior left lung base. Overlying scar versus atelectasis noted. 3 mm right middle lobe lung nodule is identified, image number 3 of series 6. Hepatobiliary: Mild diffuse hepatic steatosis. The gallbladder appears normal. No biliary dilatation. Pancreas: Normal appearance of the pancreas. Spleen: The spleen is unremarkable. Adrenals/Urinary Tract: The adrenal glands are normal. Bilateral renal cysts are identified. The urinary bladder appears normal. Stomach/Bowel: The stomach appears  normal. Small bowel loops have a normal course and caliber. There is no evidence for a bowel obstruction. Circumferential mass involving the pelvic small bowel loop is identified. This measures 5.3 x 3.9 x 4.3 cm and results in moderate narrowing of the small bowel lumen, image 61 of series 2 and image 62 of series 3. There is a large left inguinal hernia which contains a loop of sigmoid colon. Sigmoid colon diverticula noted. Vascular/Lymphatic: Calcified atherosclerotic disease involves the abdominal aorta. No aneurysm. Enlarged lymph node within the lower abdominal ileocolic mesentery measures 1.4 cm, image 50 of series 2. Reproductive: Mild prostate gland enlargement. Other: No free fluid or fluid collections identified. No peritoneal nodule or mass. Musculoskeletal: Degenerative disc disease noted within the lumbar spine. No aggressive lytic or sclerotic bone lesions. IMPRESSION: 1. Suspicious mass involving a loop of distal small bowel, likely ileum is identified and worrisome for small bowel neoplasm. This does not resolve then any significant small bowel obstruction. Surgical consultation suggested. 2. Enlarged ileocolic lymph node within the lower abdomen may represent a focus of metastatic adenopathy. 3. Left inguinal hernia contains a loop of sigmoid colon which may account for difficulty with colonoscopy. 4. Aortic atherosclerosis. 5. Hepatic steatosis. Electronically Signed   By: Kerby Moors M.D.   On: 04/30/2015 12:12   Dg Chest Portable 1 View  04/04/2015  CLINICAL DATA:  Shortness of breath.  Fall 1 week ago. EXAM: PORTABLE CHEST - 1 VIEW COMPARISON:  Two-view chest x-ray 02/22/2015. FINDINGS: The heart size is exaggerated by low lung volumes. Emphysematous changes are again noted. Median sternotomy for CABG is evident. The visualized soft tissues and bony thorax are unremarkable. IMPRESSION: No acute cardiopulmonary disease or significant interval change. Emphysema. Electronically Signed    By: San Morelle M.D.   On: 04/04/2015 11:42   Dg Hips Bilat With Pelvis 3-4 Views  04/04/2015  CLINICAL DATA:  [place] in bathroom 1 week ago, LEFT hip and low back pain, initial encounter, personal history diabetes mellitus, hypertension, TIA, coronary artery disease EXAM: DG HIP (WITH OR WITHOUT PELVIS) 3-4V BILAT COMPARISON:  None. FINDINGS: Diffuse osseous demineralization. Symmetric hip and SI joints, appear preserved. No acute fracture, dislocation, or bone destruction. Scattered atherosclerotic calcifications. Bowel gas is identified extending into the LEFT hemiscrotum compatible with inguinal hernia. IMPRESSION: Osseous demineralization without acute bony abnormalities. LEFT inguinal hernia containing bowel. Electronically Signed   By: Lavonia Dana M.D.   On: 04/04/2015 13:07     CBC  Recent Labs Lab 04/27/15 1221  04/30/15 0814 05/01/15 0624 05/02/15 0619 05/03/15 0546 05/03/15 0733  WBC 14.1*  < > 9.8 11.5* 12.2* 13.2* 11.7*  HGB 6.6*  < > 8.9* 8.6* 8.4* 11.5* 10.9*  HCT 20.8*  < > 27.7* 26.9* 26.7* 35.1* 33.1*  PLT 271  < > 212 229 220 236 198  MCV 89.3  < > 89.4 89.4 89.3 86.9 86.0  MCH 28.3  < > 28.7 28.6 28.1 28.5 28.3  MCHC 31.7  < > 32.1 32.0 31.5 32.8 32.9  RDW 16.8*  < > 15.7* 15.7* 15.5 16.6* 16.5*  LYMPHSABS 0.6*  --   --   --   --   --   --   MONOABS 0.6  --   --   --   --   --   --   EOSABS 0.2  --   --   --   --   --   --   BASOSABS 0.0  --   --   --   --   --   --   < > = values in this interval not displayed.  Chemistries   Recent Labs Lab 04/27/15 1221 04/28/15 0541 04/29/15 0621 05/02/15 0619 05/03/15 0546  NA 130* 134* 134* 136 134*  K 5.2* 4.7 4.3 4.6 4.3  CL 96* 103 104 103 99*  CO2 23 24 24 27 27   GLUCOSE 237* 101* 126* 129* 120*  BUN 31* 23* 15 15 16   CREATININE 2.10* 1.61* 1.23 1.33* 1.41*  CALCIUM 8.6* 8.2* 7.9* 8.7* 8.8*  AST  --  9*  --   --   --   ALT  --  11*  --   --   --   ALKPHOS  --  53  --   --   --   BILITOT  --  0.8   --   --   --    ------------------------------------------------------------------------------------------------------------------ estimated creatinine clearance is 31.8 mL/min (by C-G formula based on Cr of 1.41). ------------------------------------------------------------------------------------------------------------------ No results for input(s): HGBA1C in the last 72 hours. ------------------------------------------------------------------------------------------------------------------ No results for input(s): CHOL, HDL, LDLCALC, TRIG, CHOLHDL, LDLDIRECT in the last 72 hours. ------------------------------------------------------------------------------------------------------------------ No results for input(s): TSH, T4TOTAL, T3FREE, THYROIDAB in the last 72 hours.  Invalid input(s): FREET3 ------------------------------------------------------------------------------------------------------------------ No results for input(s): VITAMINB12, FOLATE, FERRITIN, TIBC, IRON, RETICCTPCT in the last 72 hours.  Coagulation profile No results for input(s): INR, PROTIME in the last 168 hours.  No results for input(s): DDIMER in the last 72 hours.  Cardiac Enzymes No results for input(s): CKMB, TROPONINI, MYOGLOBIN in the last 168 hours.  Invalid input(s): CK ------------------------------------------------------------------------------------------------------------------ Invalid input(s): POCBNP     Time Spent in minutes   25 minutes   Briget Shaheed M.D on 05/03/2015 at 9:46 AM  Between 7am to 7pm - Pager - [phone]  After 7pm go to [email or web address] - password [facility]  [facility]   Office  [phone]

## 2015-05-03 NOTE — Care Management Important Message (Signed)
Important Message  Patient Details  Name: [name] [name] MRN: [id number] Date of Birth: October 29, 1930   Medicare Important Message Given:  Yes    Sherald Barge, RN 05/03/2015, 12:37 PM

## 2015-05-03 NOTE — Op Note (Signed)
Patient:  [name] [name]  DOB:  [birth year]  MRN:  [id number]   Preop Diagnosis:  Small bowel neoplasm  Postop Diagnosis:  Same, Meckel's diverticulum  Procedure:  Exploratory laparotomy, partial small bowel resection  Surgeon:  Aviva Signs, M.D.  Anes:  Gen. endotracheal  Indications:  Patient is an 81 year old white male who was found on workup for anemia and lower gastrointestinal bleeding to have a small bowel neoplasm towards the terminal ileum. The patient now comes to the operating room for exploratory laparotomy, probable partial small bowel resection. The risks and benefits of the procedure including bleeding, infection, cardiopulmonary difficulties, and the possibility of a blood transfusion were fully explained to the patient and family, who gave informed consent.  Procedure note:  The patient was placed the supine position. After induction of general endotracheal anesthesia, the abdomen was prepped and draped using the usual sterile technique with DuraPrep. Surgical site confirmation was performed.  A midline incision was made below the umbilicus to the suprapubic region. The peritoneal cavity was entered into without difficulty. The liver was inspected and noted within normal limits. The small bowel was inspected and in the pelvis, the patient was noted to have a Meckel's diverticulum that was significantly inflamed, with thickened wall at its base. At the base of the small bowel mesentery in this region, multiple palpable lymph nodes were noted. This area was thus resected and upon-like fashion in order to remove those lymph nodes that were involved. A GIA staplers placed across the small bowel proximal and distal to the Meckel's diverticulum and fired. The mesentery was then divided using the LigaSure. The specimen was sent to pathology further examination. A side to side enteroenterostomy was performed using a GIA-75 stapler. The enterotomy was closed using a TA 60 stapler. The  staple line was bolstered using 3-0 silk sutures. The mesenteric defect was closed using a 2-0 chromic gut running suture. The bowel was returned into the abdominal cavity in an orderly fashion. The abdominal cavity was copiously irrigated with normal saline. All operating personnel changed their gowns and gloves. The fascia was reapproximated using a looped 0 PDS running suture. Subcutaneous layer was irrigated normal saline. The skin was instilled with 0.5% Sensorcaine. The skin was closed with staples. Betadine ointment and a dry sterile dressing were applied. All tape and needle counts were and needle counts were correct at the end of the procedure. The patient was extubated in the operating room and transferred to PACU in stable condition.  Complications:  None  EBL:  50 mL  Specimen:  Small bowel

## 2015-05-03 NOTE — Anesthesia Postprocedure Evaluation (Signed)
Anesthesia Post Note  Patient: [name] [name]  Procedure(s) Performed: Procedure(s) (LRB):  PARTIAL SMALL BOWEL RESECTION (N/A)  Patient location during evaluation: PACU Anesthesia Type: General Level of consciousness: confused and awake (Patient has dementia) Pain management: pain level controlled Vital Signs Assessment: post-procedure vital signs reviewed and stable Respiratory status: spontaneous breathing and respiratory function stable Cardiovascular status: stable Postop Assessment: no signs of nausea or vomiting Anesthetic complications: no    Last Vitals:  Filed Vitals:   05/03/15 1330 05/03/15 1345  BP: 157/112 158/64  Pulse: 95 89  Temp:    Resp: 21 17    Last Pain:  Filed Vitals:   05/03/15 1352  PainSc: Asleep                 [name] A

## 2015-05-03 NOTE — Anesthesia Postprocedure Evaluation (Signed)
Anesthesia Post Note  Patient: [name] [name]  Procedure(s) Performed: Procedure(s) (LRB):  PARTIAL SMALL BOWEL RESECTION (N/A)  Patient location during evaluation: ICU Anesthesia Type: General Level of consciousness: awake and confused (Patient has dementia) Pain management: pain level controlled Vital Signs Assessment: post-procedure vital signs reviewed and stable Respiratory status: respiratory function stable Cardiovascular status: stable Postop Assessment: no signs of nausea or vomiting Anesthetic complications: no    Last Vitals:  Filed Vitals:   05/03/15 1400 05/03/15 1600  BP: 142/104 138/62  Pulse: 95 96  Temp:    Resp: 21 13    Last Pain:  Filed Vitals:   05/03/15 1637  PainSc: Asleep                 ADAMS, AMY A

## 2015-05-03 NOTE — Addendum Note (Signed)
Addendum  created 05/03/15 1756 by Mickel Baas, CRNA   Modules edited: Clinical Notes   Clinical Notes:  File: [id number]

## 2015-05-03 NOTE — Care Management Note (Signed)
Case Management Note  Patient Details  Name: [name] [name] MRN: [id number] Date of Birth: May 14, 1931  Pt undergoing resection today. DC plan remains to be for pt to go home with [facility] services through [facility] If pt is discharged over weekend, staff will update [facility] of DC.   Expected Discharge Date:  04/29/15               Expected Discharge Plan:  [place]  In-House Referral:  NA  Discharge planning Services  CM Consult  Post Acute Care Choice:  Home Health Choice offered to:  Patient  DME Arranged:    DME Agency:     HH Arranged:  RN, PT [place] Agency:  [facility]  Status of Service:  Completed, signed off  Medicare Important Message Given:  Yes Date Medicare IM Given:    Medicare IM give by:    Date Additional Medicare IM Given:    Additional Medicare Important Message give by:     If discussed at [place] of Stay Meetings, dates discussed:    Additional Comments:  Sherald Barge, RN 05/03/2015, 12:39 PM

## 2015-05-03 NOTE — Anesthesia Preprocedure Evaluation (Signed)
Anesthesia Evaluation  Patient identified by MRN, date of birth, ID band Patient awake    Reviewed: Allergy & Precautions, NPO status   Airway Mallampati: II  TM Distance: >3 FB     Dental  (+) Teeth Intact, Poor Dentition, Partial Upper   Pulmonary shortness of breath, former smoker,    breath sounds clear to auscultation       Cardiovascular hypertension, Pt. on medications + CAD and + CABG   Rhythm:Regular Rate:Normal     Neuro/Psych TIA   GI/Hepatic   Endo/Other  diabetes, Type 2  Renal/GU Renal InsufficiencyRenal disease     Musculoskeletal   Abdominal   Peds  Hematology  (+) anemia ,   Anesthesia Other Findings   Reproductive/Obstetrics                             Anesthesia Physical Anesthesia Plan  ASA: III  Anesthesia Plan: General   Post-op Pain Management:    Induction: Intravenous, Rapid sequence and Cricoid pressure planned  Airway Management Planned: Oral ETT  Additional Equipment:   Intra-op Plan:   Post-operative Plan: Extubation in OR  Informed Consent: I have reviewed the patients History and Physical, chart, labs and discussed the procedure including the risks, benefits and alternatives for the proposed anesthesia with the patient or authorized representative who has indicated his/her understanding and acceptance.     Plan Discussed with:   Anesthesia Plan Comments:         Anesthesia Quick Evaluation

## 2015-05-03 NOTE — Progress Notes (Signed)
Patient in OR at this time, but will be moving to ICU after surgery. Report called to [place].

## 2015-05-04 LAB — BASIC METABOLIC PANEL
Anion gap: 8 (ref 5–15)
BUN: 13 mg/dL (ref 6–20)
CALCIUM: 8.2 mg/dL — AB (ref 8.9–10.3)
CHLORIDE: 101 mmol/L (ref 101–111)
CO2: 25 mmol/L (ref 22–32)
CREATININE: 1.31 mg/dL — AB (ref 0.61–1.24)
GFR calc Af Amer: 57 mL/min — ABNORMAL LOW (ref >60)
GFR calc non Af Amer: 49 mL/min — ABNORMAL LOW (ref >60)
GLUCOSE: 135 mg/dL — AB (ref 65–99)
Potassium: 4.2 mmol/L (ref 3.5–5.1)
Sodium: 134 mmol/L — ABNORMAL LOW (ref 135–145)

## 2015-05-04 LAB — GLUCOSE, CAPILLARY
GLUCOSE-CAPILLARY: 118 mg/dL — AB (ref 65–99)
GLUCOSE-CAPILLARY: 127 mg/dL — AB (ref 65–99)
GLUCOSE-CAPILLARY: 127 mg/dL — AB (ref 65–99)
GLUCOSE-CAPILLARY: 151 mg/dL — AB (ref 65–99)

## 2015-05-04 LAB — CBC
HEMATOCRIT: 32.8 % — AB (ref 39.0–52.0)
HEMOGLOBIN: 10.6 g/dL — AB (ref 13.0–17.0)
MCH: 28.6 pg (ref 26.0–34.0)
MCHC: 32.3 g/dL (ref 30.0–36.0)
MCV: 88.4 fL (ref 78.0–100.0)
Platelets: 208 10*3/uL (ref 150–400)
RBC: 3.71 MIL/uL — ABNORMAL LOW (ref 4.22–5.81)
RDW: 15.8 % — AB (ref 11.5–15.5)
WBC: 9.2 10*3/uL (ref 4.0–10.5)

## 2015-05-04 LAB — PHOSPHORUS: PHOSPHORUS: 3.7 mg/dL (ref 2.5–4.6)

## 2015-05-04 LAB — MAGNESIUM: Magnesium: 1.3 mg/dL — ABNORMAL LOW (ref 1.7–2.4)

## 2015-05-04 MED ORDER — HALOPERIDOL LACTATE 5 MG/ML IJ SOLN: 1.0000 mg | Freq: Four times a day (QID) | INTRAMUSCULAR | Status: DC | PRN

## 2015-05-04 MED ORDER — TAMSULOSIN HCL 0.4 MG PO CAPS
0.4000 mg | ORAL_CAPSULE | Freq: Every day | ORAL | Status: DC
  Administered 2015-05-04 – 2015-05-12 (×9): 0.4 mg via ORAL
  Filled 2015-05-04 (×9): qty 1

## 2015-05-04 MED ORDER — MAGNESIUM SULFATE 2 GM/50ML IV SOLN
2.0000 g | Freq: Once | INTRAVENOUS | Status: AC
  Administered 2015-05-04: 2 g via INTRAVENOUS
  Filled 2015-05-04: qty 50

## 2015-05-04 MED ORDER — CETYLPYRIDINIUM CHLORIDE 0.05 % MT LIQD
7.0000 mL | Freq: Two times a day (BID) | OROMUCOSAL | Status: DC
  Administered 2015-05-04 – 2015-05-12 (×15): 7 mL via OROMUCOSAL

## 2015-05-04 NOTE — Progress Notes (Signed)
1 Day Post-Op  Subjective: Patient resting comfortably.  Objective: Vital signs in last 24 hours: Temp:  [97 F (36.1 C)-98.6 F (37 C)] 97 F (36.1 C) (12/10 0400) Pulse Rate:  [89-121] 104 (12/10 0600) Resp:  [11-27] 21 (12/10 0600) BP: (105-161)/(47-112) 141/68 mmHg (12/10 0600) SpO2:  [89 %-100 %] 98 % (12/10 0600) Weight:  [57 kg (125 lb 10.6 oz)-60 kg (132 lb 4.4 oz)] 57 kg (125 lb 10.6 oz) (12/10 0400) Last BM Date: 05/02/15  Intake/Output from previous day: 12/09 0701 - 12/10 0700 In: 2770 [P.O.:360; I.V.:2410] Out: 1125 [Urine:1075; Blood:50] Intake/Output this shift: Total I/O In: -  Out: 125 [Urine:125]  General appearance: no distress GI: Soft, incision healing well.  Lab Results:   Recent Labs  05/03/15 0733 05/04/15 0432  WBC 11.7* 9.2  HGB 10.9* 10.6*  HCT 33.1* 32.8*  PLT 198 208   BMET  Recent Labs  05/03/15 0546 05/04/15 0432  NA 134* 134*  K 4.3 4.2  CL 99* 101  CO2 27 25  GLUCOSE 120* 135*  BUN 16 13  CREATININE 1.41* 1.31*  CALCIUM 8.8* 8.2*   PT/INR No results for input(s): LABPROT, INR in the last 72 hours.  Studies/Results: No results found.  Anti-infectives: Anti-infectives    Start     Dose/Rate Route Frequency Ordered Stop   05/03/15 1146  metroNIDAZOLE (FLAGYL) IVPB 500 mg     500 mg 100 mL/hr over 60 Minutes Intravenous On call to O.R. 05/03/15 1146 05/03/15 1224   05/03/15 1146  ciprofloxacin (CIPRO) IVPB 400 mg     400 mg 200 mL/hr over 60 Minutes Intravenous On call to O.R. 05/03/15 1146 05/03/15 1200      Assessment/Plan: s/p Procedure(s):  PARTIAL SMALL BOWEL RESECTION Impression: Stable on postoperative day 1. Patient does get easily agitated due to his dementia. Does have hypomagnesemia which will be addressed. IV fluids adjusted. Continue monitoring in the ICU.  LOS: 7 days    Lowana Hable A 05/04/2015

## 2015-05-04 NOTE — Progress Notes (Signed)
Patient Demographics  [name] [name], is a 81 y.o. male, DOB - 1931/05/02, [id number]  Admit date - 04/27/2015   Admitting Physician Kathie Dike, MD  Outpatient Primary MD for the patient is [name], MD  LOS - 7   Chief Complaint  Patient presents with  . Back Pain       Admission HPI/Brief narrative: 81 year old male with a hx of anemia, DM type 2, HTN, CAD, HLD, and CKD stage III that presents with chronic anemia with occult GI bleeding. Transfused 2 units PRBC,Colonoscopy was performed which revealed an extrinsic mass pushing on the colon. A CT scan  reveals a possible small bowel neoplasm or inflammatory mass in the distal small bowel in the pelvis, patient had exploratory laparotomy with partial small bowel resection on 12/9 by Dr. Arnoldo Morale.  Subjective:   [name] today has, No headache, No chest pain,No Nausea,No Cough, and planes of abdominal pain, and agitation and delirium overnight.  Assessment & Plan    Active Problems:   Diabetes mellitus, type 2 (HCC)   Essential hypertension, benign   Symptomatic anemia   Acute renal failure superimposed on stage 3 chronic kidney disease (HCC)   Dehydration   Fecal impaction in rectum (HCC)   Constipation   Falls   Lumbar compression fracture (HCC)   Hyperkalemia   Hyponatremia   BPH (benign prostatic hypertrophy)   Protein-calorie malnutrition, severe   Small bowel mass   Unilateral recurrent inguinal hernia without obstruction or gangrene   Abnormality of rectum   symptomatic iron deficiency anemia  - Transfused 2 units PRBC 12/3,  hemoglobin remained stable, iron 16, ferritin WNL,this  is most likely due to chronic GI blood loss secondary to small bowel mass . -  transfused another 2 units PRBC 12/8 in anticipation for surgery.  Mass in distal small bowel - CT abdomen and pelvis with evidence of masslike lesion in distal  small bowel. - Cardiology consulted appreciated, patient is intermediate risk for surgery. - Surgical consult greatly appreciated, status post exploratory  laparotomy with partial small bowel resection on 12/9 by Dr. Arnoldo Morale, findings are suspicious for significantly inflamed Meckel diverticulum, but will need to await final biopsy result given reactive lymph node, and significantly enlarged/inflamed mass.   Diabetes mellitus - CBG controlled on insulin  sliding scale,   Hypertension - on  when necessary hydralazine  Hyponatremia/hypokalemia/hypomagnesemia - Repeat as needed  Fecal impaction/rectal ulcers - resolved with enema - Rectal ulcers biopsy is benign, likely related to constipation  Anorexia - started on Remeron, continue supplement.  CAD - Has any chest pain or shortness of breath  Chronic kidney disease stage III - Continue to monitor, at baseline  Code Status: Full  Family Communication:  Daughter and wife at bedside   Disposition Plan: Pending further work up.   Procedures  Colonoscopy  exploratory laparotomy with partial small bowel resection on 12/9 by Dr. Manus Rudd   Gastroenterology   general surgery   cardiology   Medications  Scheduled Meds: . antiseptic oral rinse  7 mL Mouth Rinse BID  . clonazePAM  1 mg Oral QHS  . enoxaparin (LOVENOX) injection  30 mg Subcutaneous Q24H  . feeding supplement (ENSURE ENLIVE)  237 mL  Oral BID BM  . Influenza vac split quadrivalent PF  0.5 mL Intramuscular Tomorrow-1000  . insulin aspart  0-15 Units Subcutaneous TID WC  . mirtazapine  15 mg Oral QHS  . pantoprazole  40 mg Oral Daily  . pneumococcal 23 valent vaccine  0.5 mL Intramuscular Tomorrow-1000  . sodium chloride  3 mL Intravenous Q12H   Continuous Infusions: . sodium chloride 75 mL/hr at 05/04/15 1004   PRN Meds:.acetaminophen **OR** acetaminophen, haloperidol lactate, hydrALAZINE, HYDROcodone-acetaminophen, morphine injection,  ondansetron **OR** ondansetron (ZOFRAN) IV, simethicone, zolpidem  DVT Prophylaxis  SCDs ,  no chemical DVT prophylaxis given GI bleed   Lab Results  Component Value Date   PLT 208 05/04/2015    Antibiotics   Anti-infectives    Start     Dose/Rate Route Frequency Ordered Stop   05/03/15 1146  metroNIDAZOLE (FLAGYL) IVPB 500 mg     500 mg 100 mL/hr over 60 Minutes Intravenous On call to O.R. 05/03/15 1146 05/03/15 1224   05/03/15 1146  ciprofloxacin (CIPRO) IVPB 400 mg     400 mg 200 mL/hr over 60 Minutes Intravenous On call to O.R. 05/03/15 1146 05/03/15 1200          Objective:   Filed Vitals:   05/04/15 0700 05/04/15 0800 05/04/15 0900 05/04/15 1000  BP: 136/67 165/91 139/72 141/75  Pulse: 102 107 107 107  Temp:  97.2 F (36.2 C)    TempSrc:  Oral    Resp: 21 26 16 13   Height:      Weight:      SpO2: 97% 100% 99% 95%    Wt Readings from Last 3 Encounters:  05/04/15 57 kg (125 lb 10.6 oz)  04/04/15 59 kg (130 lb 1.1 oz)  03/21/15 61.78 kg (136 lb 3.2 oz)     Intake/Output Summary (Last 24 hours) at 05/04/15 1151 Last data filed at 05/04/15 1029  Gross per 24 hour  Intake   2770 ml  Output   1400 ml  Net   1370 ml     Physical Exam General: NAD, looks comfortable  Cardiovascular: RRR, S1, S2  Respiratory: clear bilaterally, No wheezing, rales or rhonchi  Abdomen: soft, midline surgical scar covered with mesh, no distention , bowel sounds present Musculoskeletal: No edema b/l    Data Review   Micro Results Recent Results (from the past 240 hour(s))  MRSA PCR Screening     Status: None   Collection Time: 05/02/15 11:45 AM  Result Value Ref Range Status   MRSA by PCR NEGATIVE NEGATIVE Final    Comment:        The GeneXpert MRSA Assay (FDA approved for NASAL specimens only), is one component of a comprehensive MRSA colonization surveillance program. It is not intended to diagnose MRSA infection nor to guide or monitor treatment for MRSA  infections.     Radiology Reports Dg Lumbar Spine Complete  04/27/2015  CLINICAL DATA:  Low back pain.  Recent falls. EXAM: LUMBAR SPINE - COMPLETE 4+ VIEW COMPARISON:  04/04/2015 FINDINGS: There is an L4 body fracture with superior and inferior endplate compression deformities. Height loss is approximately 25% at maximum. An L1 superior endplate fracture is chronic. No subluxation. Spondylosis without focal or advanced disc narrowing. Extensive aortic atherosclerosis. Large stool volume, especially in the rectum. Osteopenia. IMPRESSION: 1. L4 vertebral body acute fracture with mild height loss. 2. Large stool volume with potential rectal impaction. Electronically Signed   By: Monte Fantasia M.D.   On: 04/27/2015  14:12   Dg Lumbar Spine Complete  04/04/2015  CLINICAL DATA:  Fall 1 week prior.  Low back pain. EXAM: LUMBAR SPINE - COMPLETE 4+ VIEW COMPARISON:  None. FINDINGS: This report assumes 5 non rib-bearing lumbar vertebrae. There is a very mild anterior L1 vertebral body compression deformity of indeterminate chronicity, favor chronic. Remaining lumbar vertebral body heights are preserved. Mild degenerative disc disease at L2-3. Mild spondylosis throughout the remaining lumbar spine. No spondylolisthesis. Mild facet arthropathy. No appreciable foraminal stenosis. No aggressive appearing focal osseous lesions. IMPRESSION: 1. Very mild anterior L1 vertebral compression deformity of indeterminate chronicity, favor chronic. 2. Mild degenerative disc disease and facet arthropathy in the lumbar spine as described. Electronically Signed   By: Ilona Sorrel M.D.   On: 04/04/2015 13:19   Ct Abdomen Pelvis W Contrast  04/30/2015  CLINICAL DATA:  Incomplete colonoscopy. EXAM: CT ABDOMEN AND PELVIS WITH CONTRAST TECHNIQUE: Multidetector CT imaging of the abdomen and pelvis was performed using the standard protocol following bolus administration of intravenous contrast. CONTRAST:  179mL OMNIPAQUE IOHEXOL 300  MG/ML  SOLN COMPARISON:  None. FINDINGS: Lower chest: Chronic appearing pleural thickening with calcification overlies the posterior left lung base. Overlying scar versus atelectasis noted. 3 mm right middle lobe lung nodule is identified, image number 3 of series 6. Hepatobiliary: Mild diffuse hepatic steatosis. The gallbladder appears normal. No biliary dilatation. Pancreas: Normal appearance of the pancreas. Spleen: The spleen is unremarkable. Adrenals/Urinary Tract: The adrenal glands are normal. Bilateral renal cysts are identified. The urinary bladder appears normal. Stomach/Bowel: The stomach appears normal. Small bowel loops have a normal course and caliber. There is no evidence for a bowel obstruction. Circumferential mass involving the pelvic small bowel loop is identified. This measures 5.3 x 3.9 x 4.3 cm and results in moderate narrowing of the small bowel lumen, image 61 of series 2 and image 62 of series 3. There is a large left inguinal hernia which contains a loop of sigmoid colon. Sigmoid colon diverticula noted. Vascular/Lymphatic: Calcified atherosclerotic disease involves the abdominal aorta. No aneurysm. Enlarged lymph node within the lower abdominal ileocolic mesentery measures 1.4 cm, image 50 of series 2. Reproductive: Mild prostate gland enlargement. Other: No free fluid or fluid collections identified. No peritoneal nodule or mass. Musculoskeletal: Degenerative disc disease noted within the lumbar spine. No aggressive lytic or sclerotic bone lesions. IMPRESSION: 1. Suspicious mass involving a loop of distal small bowel, likely ileum is identified and worrisome for small bowel neoplasm. This does not resolve then any significant small bowel obstruction. Surgical consultation suggested. 2. Enlarged ileocolic lymph node within the lower abdomen may represent a focus of metastatic adenopathy. 3. Left inguinal hernia contains a loop of sigmoid colon which may account for difficulty with  colonoscopy. 4. Aortic atherosclerosis. 5. Hepatic steatosis. Electronically Signed   By: Kerby Moors M.D.   On: 04/30/2015 12:12   Dg Hips Bilat With Pelvis 3-4 Views  04/04/2015  CLINICAL DATA:  [place] [address] in bathroom 1 week ago, LEFT hip and low back pain, initial encounter, personal history diabetes mellitus, hypertension, TIA, coronary artery disease EXAM: DG HIP (WITH OR WITHOUT PELVIS) 3-4V BILAT COMPARISON:  None. FINDINGS: Diffuse osseous demineralization. Symmetric hip and SI joints, appear preserved. No acute fracture, dislocation, or bone destruction. Scattered atherosclerotic calcifications. Bowel gas is identified extending into the LEFT hemiscrotum compatible with inguinal hernia. IMPRESSION: Osseous demineralization without acute bony abnormalities. LEFT inguinal hernia containing bowel. Electronically Signed   By: Lavonia Dana M.D.   On:  04/04/2015 13:07     CBC  Recent Labs Lab 04/27/15 1221  05/01/15 0624 05/02/15 0619 05/03/15 0546 05/03/15 0733 05/04/15 0432  WBC 14.1*  < > 11.5* 12.2* 13.2* 11.7* 9.2  HGB 6.6*  < > 8.6* 8.4* 11.5* 10.9* 10.6*  HCT 20.8*  < > 26.9* 26.7* 35.1* 33.1* 32.8*  PLT 271  < > 229 220 236 198 208  MCV 89.3  < > 89.4 89.3 86.9 86.0 88.4  MCH 28.3  < > 28.6 28.1 28.5 28.3 28.6  MCHC 31.7  < > 32.0 31.5 32.8 32.9 32.3  RDW 16.8*  < > 15.7* 15.5 16.6* 16.5* 15.8*  LYMPHSABS 0.6*  --   --   --   --   --   --   MONOABS 0.6  --   --   --   --   --   --   EOSABS 0.2  --   --   --   --   --   --   BASOSABS 0.0  --   --   --   --   --   --   < > = values in this interval not displayed.  Chemistries   Recent Labs Lab 04/28/15 0541 04/29/15 0621 05/02/15 0619 05/03/15 0546 05/04/15 0432  NA 134* 134* 136 134* 134*  K 4.7 4.3 4.6 4.3 4.2  CL 103 104 103 99* 101  CO2 24 24 27 27 25   GLUCOSE 101* 126* 129* 120* 135*  BUN 23* 15 15 16 13   CREATININE 1.61* 1.23 1.33* 1.41* 1.31*  CALCIUM 8.2* 7.9* 8.7* 8.8* 8.2*  MG  --   --   --   --  1.3*    AST 9*  --   --   --   --   ALT 11*  --   --   --   --   ALKPHOS 53  --   --   --   --   BILITOT 0.8  --   --   --   --    ------------------------------------------------------------------------------------------------------------------ estimated creatinine clearance is 35.1 mL/min (by C-G formula based on Cr of 1.31). ------------------------------------------------------------------------------------------------------------------ No results for input(s): HGBA1C in the last 72 hours. ------------------------------------------------------------------------------------------------------------------ No results for input(s): CHOL, HDL, LDLCALC, TRIG, CHOLHDL, LDLDIRECT in the last 72 hours. ------------------------------------------------------------------------------------------------------------------ No results for input(s): TSH, T4TOTAL, T3FREE, THYROIDAB in the last 72 hours.  Invalid input(s): FREET3 ------------------------------------------------------------------------------------------------------------------ No results for input(s): VITAMINB12, FOLATE, FERRITIN, TIBC, IRON, RETICCTPCT in the last 72 hours.  Coagulation profile No results for input(s): INR, PROTIME in the last 168 hours.  No results for input(s): DDIMER in the last 72 hours.  Cardiac Enzymes No results for input(s): CKMB, TROPONINI, MYOGLOBIN in the last 168 hours.  Invalid input(s): CK ------------------------------------------------------------------------------------------------------------------ Invalid input(s): POCBNP     Time Spent in minutes   25 minutes   Cameryn Schum M.D on 05/04/2015 at 11:51 AM  Between 7am to 7pm - Pager - [phone]  After 7pm go to [email or web address] - password [email or web address]  [facility]   Office  [phone]

## 2015-05-04 NOTE — Plan of Care (Signed)
Problem: Pain Managment: Goal: General experience of comfort will improve Outcome: Progressing Patient displaying abdominal/bladder pain.  Patient was unable to go after several attempts to void.  In/out cath order obtained and procedure explained to relieve his bladder pain/spams.

## 2015-05-05 ENCOUNTER — Inpatient Hospital Stay (HOSPITAL_COMMUNITY): Payer: Medicare Other

## 2015-05-05 LAB — BASIC METABOLIC PANEL
Anion gap: 7 (ref 5–15)
BUN: 10 mg/dL (ref 6–20)
CO2: 23 mmol/L (ref 22–32)
Calcium: 8 mg/dL — ABNORMAL LOW (ref 8.9–10.3)
Chloride: 101 mmol/L (ref 101–111)
Creatinine, Ser: 1.16 mg/dL (ref 0.61–1.24)
GFR calc Af Amer: >60 mL/min (ref >60)
GFR calc non Af Amer: 57 mL/min — ABNORMAL LOW (ref >60)
Glucose, Bld: 124 mg/dL — ABNORMAL HIGH (ref 65–99)
Potassium: 4.2 mmol/L (ref 3.5–5.1)
Sodium: 131 mmol/L — ABNORMAL LOW (ref 135–145)

## 2015-05-05 LAB — GLUCOSE, CAPILLARY
GLUCOSE-CAPILLARY: 141 mg/dL — AB (ref 65–99)
Glucose-Capillary: 124 mg/dL — ABNORMAL HIGH (ref 65–99)
Glucose-Capillary: 128 mg/dL — ABNORMAL HIGH (ref 65–99)
Glucose-Capillary: 130 mg/dL — ABNORMAL HIGH (ref 65–99)

## 2015-05-05 LAB — MAGNESIUM: Magnesium: 1.8 mg/dL (ref 1.7–2.4)

## 2015-05-05 LAB — CBC
HCT: 32.4 % — ABNORMAL LOW (ref 39.0–52.0)
Hemoglobin: 10.5 g/dL — ABNORMAL LOW (ref 13.0–17.0)
MCH: 28.2 pg (ref 26.0–34.0)
MCHC: 32.4 g/dL (ref 30.0–36.0)
MCV: 87.1 fL (ref 78.0–100.0)
Platelets: 199 10*3/uL (ref 150–400)
RBC: 3.72 MIL/uL — ABNORMAL LOW (ref 4.22–5.81)
RDW: 15.6 % — ABNORMAL HIGH (ref 11.5–15.5)
WBC: 10.8 10*3/uL — ABNORMAL HIGH (ref 4.0–10.5)

## 2015-05-05 LAB — PHOSPHORUS: Phosphorus: 2.4 mg/dL — ABNORMAL LOW (ref 2.5–4.6)

## 2015-05-05 MED ORDER — METOPROLOL TARTRATE 25 MG PO TABS
12.5000 mg | ORAL_TABLET | Freq: Two times a day (BID) | ORAL | Status: DC
  Administered 2015-05-05 (×2): 12.5 mg via ORAL
  Filled 2015-05-05 (×2): qty 1

## 2015-05-05 NOTE — Progress Notes (Signed)
2 Days Post-Op  Subjective: Patient resting comfortably. Nursing reports he has had multiple episodes of flatus.  Objective: Vital signs in last 24 hours: Temp:  [97.1 F (36.2 C)-98.5 F (36.9 C)] 97.1 F (36.2 C) (12/11 0829) Pulse Rate:  [25-129] 129 (12/11 0800) Resp:  [10-25] 25 (12/11 0800) BP: (111-161)/(55-95) 119/75 mmHg (12/11 0700) SpO2:  [92 %-100 %] 95 % (12/11 0800) Weight:  [59.3 kg (130 lb 11.7 oz)] 59.3 kg (130 lb 11.7 oz) (12/11 0500) Last BM Date: 05/02/15  Intake/Output from previous day: 12/10 0701 - 12/11 0700 In: 2621.7 [P.O.:660; I.V.:1911.7; IV Piggyback:50] Out: [id number] [Urine:1675] Intake/Output this shift: Total I/O In: 240 [P.O.:240] Out: 200 [Urine:200]  General appearance: cooperative, appears stated age and no distress GI: Soft, incision healing well. Occasional bowel sounds appreciated.  Lab Results:   Recent Labs  05/04/15 0432 05/05/15 0443  WBC 9.2 10.8*  HGB 10.6* 10.5*  HCT 32.8* 32.4*  PLT 208 199   BMET  Recent Labs  05/04/15 0432 05/05/15 0443  NA 134* 131*  K 4.2 4.2  CL 101 101  CO2 25 23  GLUCOSE 135* 124*  BUN 13 10  CREATININE 1.31* 1.16  CALCIUM 8.2* 8.0*   PT/INR No results for input(s): LABPROT, INR in the last 72 hours.  Studies/Results: No results found.  Anti-infectives: Anti-infectives    Start     Dose/Rate Route Frequency Ordered Stop   05/03/15 1146  metroNIDAZOLE (FLAGYL) IVPB 500 mg     500 mg 100 mL/hr over 60 Minutes Intravenous On call to O.R. 05/03/15 1146 05/03/15 1224   05/03/15 1146  ciprofloxacin (CIPRO) IVPB 400 mg     400 mg 200 mL/hr over 60 Minutes Intravenous On call to O.R. 05/03/15 1146 05/03/15 1200      Assessment/Plan: s/p Procedure(s):  PARTIAL SMALL BOWEL RESECTION Impression: Tachycardia noted on postoperative day 2. Agree with ultrasound of lower extremities to rule out DVT. Hemoglobin stable. We'll advance to full liquid diet.  LOS: 8 days    Jodye Scali  A 05/05/2015

## 2015-05-05 NOTE — Progress Notes (Signed)
Patient up in the chair for breakfast. Tolerated well.

## 2015-05-05 NOTE — Progress Notes (Signed)
Patient Demographics  [name] [name], is a 81 y.o. male, DOB - 20-Nov-1930, [id number]  Admit date - 04/27/2015   Admitting Physician Kathie Dike, MD  Outpatient Primary MD for the patient is [name], MD  LOS - 8   Chief Complaint  Patient presents with  . Back Pain       Admission HPI/Brief narrative: 81 year old male with a hx of anemia, DM type 2, HTN, CAD, HLD, and CKD stage III that presents with chronic anemia with occult GI bleeding. Transfused 2 units PRBC,Colonoscopy was performed which revealed an extrinsic mass pushing on the colon. A CT scan  reveals a possible small bowel neoplasm or inflammatory mass in the distal small bowel in the pelvis, patient had exploratory laparotomy with partial small bowel resection on 12/9 by Dr. Arnoldo Morale.  Subjective:   [name] [name] today has, No headache, No chest pain,No Nausea,No Cough, abdominal pain significantly improved , had an episode of urinary retention overnight . Assessment & Plan    Active Problems:   Diabetes mellitus, type 2 (HCC)   Essential hypertension, benign   Symptomatic anemia   Acute renal failure superimposed on stage 3 chronic kidney disease (HCC)   Dehydration   Fecal impaction in rectum (HCC)   Constipation   Falls   Lumbar compression fracture (HCC)   Hyperkalemia   Hyponatremia   BPH (benign prostatic hypertrophy)   Protein-calorie malnutrition, severe   Small bowel mass   Unilateral recurrent inguinal hernia without obstruction or gangrene   Abnormality of rectum   symptomatic iron deficiency anemia  - Transfused 2 units PRBC 12/3,  hemoglobin remained stable, iron 16, ferritin WNL,this  is most likely due to chronic GI blood loss secondary to small bowel mass . -  transfused another 2 units PRBC 12/8 in anticipation for surgery.  Mass in distal small bowel - CT abdomen and pelvis with evidence of masslike  lesion in distal small bowel. - Cardiology consulted appreciated, patient is intermediate risk for surgery. - Surgical consult greatly appreciated, status post exploratory  laparotomy with partial small bowel resection on 12/9 by Dr. Arnoldo Morale, findings are suspicious for significantly inflamed Meckel diverticulum, but will need to await final biopsy result given reactive lymph node, and significantly enlarged/inflamed mass. - Further management per surgery, advanced to soft diet today.  Urinary retention - Resolved, continue with Flomax  Sinus tachycardia - Is likely related to pain and discomfort, no hypoxia noted, bilateral venous Doppler negative for DVT.  Diabetes mellitus - CBG controlled on insulin  sliding scale,   Hypertension - on  when necessary hydralazine, will start on low dose metoprolol as blood pressure started to increase.  Hyponatremia/hypokalemia/hypomagnesemia - Replete  as needed  Fecal impaction/rectal ulcers - resolved with enema - Rectal ulcers biopsy is benign, likely related to constipation  Anorexia - started on Remeron, continue supplement.  CAD - Has any chest pain or shortness of breath  Chronic kidney disease stage III - Continue to monitor, at baseline  Code Status: Full  Family Communication: None at bedside  Disposition Plan: Pending further work up.   Procedures  Colonoscopy  exploratory laparotomy with partial small bowel resection on 12/9 by Dr. Manus Rudd   Gastroenterology  general surgery   cardiology   Medications  Scheduled Meds: . antiseptic oral rinse  7 mL Mouth Rinse BID  . clonazePAM  1 mg Oral QHS  . enoxaparin (LOVENOX) injection  30 mg Subcutaneous Q24H  . feeding supplement (ENSURE ENLIVE)  237 mL Oral BID BM  . Influenza vac split quadrivalent PF  0.5 mL Intramuscular Tomorrow-1000  . insulin aspart  0-15 Units Subcutaneous TID WC  . mirtazapine  15 mg Oral QHS  . pantoprazole  40 mg Oral Daily  .  pneumococcal 23 valent vaccine  0.5 mL Intramuscular Tomorrow-1000  . sodium chloride  3 mL Intravenous Q12H  . tamsulosin  0.4 mg Oral Daily   Continuous Infusions: . sodium chloride 75 mL/hr at 05/05/15 0138   PRN Meds:.acetaminophen **OR** acetaminophen, haloperidol lactate, hydrALAZINE, HYDROcodone-acetaminophen, morphine injection, ondansetron **OR** ondansetron (ZOFRAN) IV, simethicone  DVT Prophylaxis  SCDs , Lovenox  Lab Results  Component Value Date   PLT 199 05/05/2015    Antibiotics   Anti-infectives    Start     Dose/Rate Route Frequency Ordered Stop   05/03/15 1146  metroNIDAZOLE (FLAGYL) IVPB 500 mg     500 mg 100 mL/hr over 60 Minutes Intravenous On call to O.R. 05/03/15 1146 05/03/15 1224   05/03/15 1146  ciprofloxacin (CIPRO) IVPB 400 mg     400 mg 200 mL/hr over 60 Minutes Intravenous On call to O.R. 05/03/15 1146 05/03/15 1200          Objective:   Filed Vitals:   05/05/15 0829 05/05/15 0900 05/05/15 1000 05/05/15 1100  BP:      Pulse:  122 111 112  Temp: 97.1 F (36.2 C)     TempSrc: Oral     Resp:  13 14 17   Height:      Weight:      SpO2:  96% 96% 96%    Wt Readings from Last 3 Encounters:  05/05/15 59.3 kg (130 lb 11.7 oz)  04/04/15 59 kg (130 lb 1.1 oz)  03/21/15 61.78 kg (136 lb 3.2 oz)     Intake/Output Summary (Last 24 hours) at 05/05/15 1216 Last data filed at 05/05/15 0810  Gross per 24 hour  Intake   2140 ml  Output   1600 ml  Net    540 ml     Physical Exam General: NAD, looks comfortable  Cardiovascular: RRR, S1, S2  Respiratory: clear bilaterally, No wheezing, rales or rhonchi  Abdomen: soft, midline surgical scar covered with mesh, no distention , bowel sounds present Musculoskeletal: No edema b/l    Data Review   Micro Results Recent Results (from the past 240 hour(s))  MRSA PCR Screening     Status: None   Collection Time: 05/02/15 11:45 AM  Result Value Ref Range Status   MRSA by PCR NEGATIVE NEGATIVE  Final    Comment:        The GeneXpert MRSA Assay (FDA approved for NASAL specimens only), is one component of a comprehensive MRSA colonization surveillance program. It is not intended to diagnose MRSA infection nor to guide or monitor treatment for MRSA infections.     Radiology Reports Dg Lumbar Spine Complete  04/27/2015  CLINICAL DATA:  Low back pain.  Recent falls. EXAM: LUMBAR SPINE - COMPLETE 4+ VIEW COMPARISON:  04/04/2015 FINDINGS: There is an L4 body fracture with superior and inferior endplate compression deformities. Height loss is approximately 25% at maximum. An L1 superior endplate fracture is chronic. No subluxation. Spondylosis without  focal or advanced disc narrowing. Extensive aortic atherosclerosis. Large stool volume, especially in the rectum. Osteopenia. IMPRESSION: 1. L4 vertebral body acute fracture with mild height loss. 2. Large stool volume with potential rectal impaction. Electronically Signed   By: Monte Fantasia M.D.   On: 04/27/2015 14:12   Ct Abdomen Pelvis W Contrast  04/30/2015  CLINICAL DATA:  Incomplete colonoscopy. EXAM: CT ABDOMEN AND PELVIS WITH CONTRAST TECHNIQUE: Multidetector CT imaging of the abdomen and pelvis was performed using the standard protocol following bolus administration of intravenous contrast. CONTRAST:  143mL OMNIPAQUE IOHEXOL 300 MG/ML  SOLN COMPARISON:  None. FINDINGS: Lower chest: Chronic appearing pleural thickening with calcification overlies the posterior left lung base. Overlying scar versus atelectasis noted. 3 mm right middle lobe lung nodule is identified, image number 3 of series 6. Hepatobiliary: Mild diffuse hepatic steatosis. The gallbladder appears normal. No biliary dilatation. Pancreas: Normal appearance of the pancreas. Spleen: The spleen is unremarkable. Adrenals/Urinary Tract: The adrenal glands are normal. Bilateral renal cysts are identified. The urinary bladder appears normal. Stomach/Bowel: The stomach appears  normal. Small bowel loops have a normal course and caliber. There is no evidence for a bowel obstruction. Circumferential mass involving the pelvic small bowel loop is identified. This measures 5.3 x 3.9 x 4.3 cm and results in moderate narrowing of the small bowel lumen, image 61 of series 2 and image 62 of series 3. There is a large left inguinal hernia which contains a loop of sigmoid colon. Sigmoid colon diverticula noted. Vascular/Lymphatic: Calcified atherosclerotic disease involves the abdominal aorta. No aneurysm. Enlarged lymph node within the lower abdominal ileocolic mesentery measures 1.4 cm, image 50 of series 2. Reproductive: Mild prostate gland enlargement. Other: No free fluid or fluid collections identified. No peritoneal nodule or mass. Musculoskeletal: Degenerative disc disease noted within the lumbar spine. No aggressive lytic or sclerotic bone lesions. IMPRESSION: 1. Suspicious mass involving a loop of distal small bowel, likely ileum is identified and worrisome for small bowel neoplasm. This does not resolve then any significant small bowel obstruction. Surgical consultation suggested. 2. Enlarged ileocolic lymph node within the lower abdomen may represent a focus of metastatic adenopathy. 3. Left inguinal hernia contains a loop of sigmoid colon which may account for difficulty with colonoscopy. 4. Aortic atherosclerosis. 5. Hepatic steatosis. Electronically Signed   By: Kerby Moors M.D.   On: 04/30/2015 12:12   US Venous Img Lower Bilateral  05/05/2015  CLINICAL DATA:  Bilateral lower extremity pain EXAM: BILATERAL LOWER EXTREMITY VENOUS DUPLEX ULTRASOUND TECHNIQUE: Gray-scale sonography with graded compression, as well as color Doppler and duplex ultrasound were performed to evaluate the lower extremity deep venous systems from the level of the common femoral vein and including the common femoral, femoral, profunda femoral, popliteal and calf veins including the posterior tibial,  peroneal and gastrocnemius veins when visible. The superficial great saphenous vein was also interrogated. Spectral Doppler was utilized to evaluate flow at rest and with distal augmentation maneuvers in the common femoral, femoral and popliteal veins. COMPARISON:  None. FINDINGS: RIGHT LOWER EXTREMITY Common Femoral Vein: No evidence of thrombus. Normal compressibility, respiratory phasicity and response to augmentation. Saphenofemoral Junction: No evidence of thrombus. Normal compressibility and flow on color Doppler imaging. Profunda Femoral Vein: No evidence of thrombus. Normal compressibility and flow on color Doppler imaging. Femoral Vein: No evidence of thrombus. Normal compressibility, respiratory phasicity and response to augmentation. Popliteal Vein: No evidence of thrombus. Normal compressibility, respiratory phasicity and response to augmentation. Calf Veins: No evidence of  thrombus. Normal compressibility and flow on color Doppler imaging. Superficial Great Saphenous Vein: No evidence of thrombus. Normal compressibility and flow on color Doppler imaging. Venous Reflux:  None. Other Findings:  None. LEFT LOWER EXTREMITY Common Femoral Vein: No evidence of thrombus. Normal compressibility, respiratory phasicity and response to augmentation. Saphenofemoral Junction: No evidence of thrombus. Normal compressibility and flow on color Doppler imaging. Profunda Femoral Vein: No evidence of thrombus. Normal compressibility and flow on color Doppler imaging. Femoral Vein: No evidence of thrombus. Normal compressibility, respiratory phasicity and response to augmentation. Popliteal Vein: No evidence of thrombus. Normal compressibility, respiratory phasicity and response to augmentation. Calf Veins: No evidence of thrombus. Normal compressibility and flow on color Doppler imaging. Superficial Great Saphenous Vein: No evidence of thrombus. Normal compressibility and flow on color Doppler imaging. Venous Reflux:   None. Other Findings:  None. IMPRESSION: No evidence of deep venous thrombosis in either lower extremity. Electronically Signed   By: Lowella Grip III M.D.   On: 05/05/2015 10:33     CBC  Recent Labs Lab 05/02/15 0619 05/03/15 0546 05/03/15 0733 05/04/15 0432 05/05/15 0443  WBC 12.2* 13.2* 11.7* 9.2 10.8*  HGB 8.4* 11.5* 10.9* 10.6* 10.5*  HCT 26.7* 35.1* 33.1* 32.8* 32.4*  PLT 220 236 198 208 199  MCV 89.3 86.9 86.0 88.4 87.1  MCH 28.1 28.5 28.3 28.6 28.2  MCHC 31.5 32.8 32.9 32.3 32.4  RDW 15.5 16.6* 16.5* 15.8* 15.6*    Chemistries   Recent Labs Lab 04/29/15 0621 05/02/15 0619 05/03/15 0546 05/04/15 0432 05/05/15 0443  NA 134* 136 134* 134* 131*  K 4.3 4.6 4.3 4.2 4.2  CL 104 103 99* 101 101  CO2 24 27 27 25 23   GLUCOSE 126* 129* 120* 135* 124*  BUN 15 15 16 13 10   CREATININE 1.23 1.33* 1.41* 1.31* 1.16  CALCIUM 7.9* 8.7* 8.8* 8.2* 8.0*  MG  --   --   --  1.3* 1.8   ------------------------------------------------------------------------------------------------------------------ estimated creatinine clearance is 41.2 mL/min (by C-G formula based on Cr of 1.16). ------------------------------------------------------------------------------------------------------------------ No results for input(s): HGBA1C in the last 72 hours. ------------------------------------------------------------------------------------------------------------------ No results for input(s): CHOL, HDL, LDLCALC, TRIG, CHOLHDL, LDLDIRECT in the last 72 hours. ------------------------------------------------------------------------------------------------------------------ No results for input(s): TSH, T4TOTAL, T3FREE, THYROIDAB in the last 72 hours.  Invalid input(s): FREET3 ------------------------------------------------------------------------------------------------------------------ No results for input(s): VITAMINB12, FOLATE, FERRITIN, TIBC, IRON, RETICCTPCT in the last 72  hours.  Coagulation profile No results for input(s): INR, PROTIME in the last 168 hours.  No results for input(s): DDIMER in the last 72 hours.  Cardiac Enzymes No results for input(s): CKMB, TROPONINI, MYOGLOBIN in the last 168 hours.  Invalid input(s): CK ------------------------------------------------------------------------------------------------------------------ Invalid input(s): POCBNP     Time Spent in minutes   25 minutes   Selita Staiger M.D on 05/05/2015 at 12:16 PM  Between 7am to 7pm - Pager - [phone]  After 7pm go to [email or web address] - password [facility]  [facility]   Office  [phone]

## 2015-05-05 NOTE — Plan of Care (Signed)
Problem: Activity: Goal: Risk for activity intolerance will decrease Outcome: Progressing Educated on the coughing and deep breathing how to use pillow to splint abdomen.

## 2015-05-05 NOTE — Progress Notes (Signed)
Patient attempting to climb out bed to help the person yelling. Reoriented patient to situation and assisted patient back to bed.

## 2015-05-05 NOTE — Progress Notes (Signed)
Patient noted removing electrodes states he hurting really bad. Medicated for pain.

## 2015-05-06 ENCOUNTER — Encounter (HOSPITAL_COMMUNITY): Payer: Self-pay | Admitting: General Surgery

## 2015-05-06 ENCOUNTER — Inpatient Hospital Stay (HOSPITAL_COMMUNITY): Payer: Medicare Other

## 2015-05-06 LAB — GLUCOSE, CAPILLARY
GLUCOSE-CAPILLARY: 117 mg/dL — AB (ref 65–99)
GLUCOSE-CAPILLARY: 101 mg/dL — AB (ref 65–99)
GLUCOSE-CAPILLARY: 159 mg/dL — AB (ref 65–99)
Glucose-Capillary: 125 mg/dL — ABNORMAL HIGH (ref 65–99)

## 2015-05-06 LAB — URINALYSIS, ROUTINE W REFLEX MICROSCOPIC
BILIRUBIN URINE: NEGATIVE
Glucose, UA: NEGATIVE mg/dL
HGB URINE DIPSTICK: NEGATIVE
Ketones, ur: NEGATIVE mg/dL
Leukocytes, UA: NEGATIVE
Nitrite: NEGATIVE
PH: 6 (ref 5.0–8.0)
Protein, ur: NEGATIVE mg/dL
SPECIFIC GRAVITY, URINE: 1.015 (ref 1.005–1.030)

## 2015-05-06 LAB — TYPE AND SCREEN
ABO/RH(D): O POS
ANTIBODY SCREEN: NEGATIVE
UNIT DIVISION: 0
Unit division: 0
Unit division: 0
Unit division: 0

## 2015-05-06 LAB — BASIC METABOLIC PANEL
Anion gap: 8 (ref 5–15)
BUN: 15 mg/dL (ref 6–20)
CALCIUM: 8.3 mg/dL — AB (ref 8.9–10.3)
CO2: 23 mmol/L (ref 22–32)
Chloride: 102 mmol/L (ref 101–111)
Creatinine, Ser: 1.33 mg/dL — ABNORMAL HIGH (ref 0.61–1.24)
GFR calc Af Amer: 56 mL/min — ABNORMAL LOW (ref >60)
GFR, EST NON AFRICAN AMERICAN: 48 mL/min — AB (ref >60)
GLUCOSE: 133 mg/dL — AB (ref 65–99)
Potassium: 4.6 mmol/L (ref 3.5–5.1)
Sodium: 133 mmol/L — ABNORMAL LOW (ref 135–145)

## 2015-05-06 LAB — CBC
HEMATOCRIT: 31.5 % — AB (ref 39.0–52.0)
Hemoglobin: 10.1 g/dL — ABNORMAL LOW (ref 13.0–17.0)
MCH: 28.1 pg (ref 26.0–34.0)
MCHC: 32.1 g/dL (ref 30.0–36.0)
MCV: 87.5 fL (ref 78.0–100.0)
PLATELETS: 218 10*3/uL (ref 150–400)
RBC: 3.6 MIL/uL — ABNORMAL LOW (ref 4.22–5.81)
RDW: 15.5 % (ref 11.5–15.5)
WBC: 16.4 10*3/uL — ABNORMAL HIGH (ref 4.0–10.5)

## 2015-05-06 LAB — MAGNESIUM: Magnesium: 1.7 mg/dL (ref 1.7–2.4)

## 2015-05-06 LAB — PHOSPHORUS: Phosphorus: 2.8 mg/dL (ref 2.5–4.6)

## 2015-05-06 MED ORDER — METOPROLOL TARTRATE 25 MG PO TABS
12.5000 mg | ORAL_TABLET | Freq: Two times a day (BID) | ORAL | Status: DC
  Administered 2015-05-06 – 2015-05-10 (×7): 12.5 mg via ORAL
  Filled 2015-05-06 (×8): qty 1

## 2015-05-06 MED ORDER — RESOURCE THICKENUP CLEAR PO POWD
ORAL | Status: DC | PRN
  Administered 2015-05-06: 18:00:00 via ORAL
  Filled 2015-05-06: qty 125

## 2015-05-06 MED ORDER — VANCOMYCIN HCL IN DEXTROSE 1-5 GM/200ML-% IV SOLN
1000.0000 mg | INTRAVENOUS | Status: DC
  Administered 2015-05-07 – 2015-05-09 (×3): 1000 mg via INTRAVENOUS
  Filled 2015-05-06 (×3): qty 200

## 2015-05-06 MED ORDER — GUAIFENESIN ER 600 MG PO TB12
1200.0000 mg | ORAL_TABLET | Freq: Two times a day (BID) | ORAL | Status: DC
  Administered 2015-05-06 – 2015-05-12 (×13): 1200 mg via ORAL
  Filled 2015-05-06 (×14): qty 2

## 2015-05-06 MED ORDER — METOPROLOL TARTRATE 25 MG PO TABS
25.0000 mg | ORAL_TABLET | Freq: Two times a day (BID) | ORAL | Status: DC
Start: 2015-05-06 — End: 2015-05-06
  Administered 2015-05-06: 25 mg via ORAL
  Filled 2015-05-06: qty 1

## 2015-05-06 MED ORDER — PIPERACILLIN-TAZOBACTAM 3.375 G IVPB
3.3750 g | Freq: Three times a day (TID) | INTRAVENOUS | Status: DC
  Administered 2015-05-06 – 2015-05-09 (×9): 3.375 g via INTRAVENOUS
  Filled 2015-05-06 (×6): qty 50

## 2015-05-06 MED ORDER — DEXTROSE 5 % IV SOLN
10.0000 mmol | Freq: Once | INTRAVENOUS | Status: AC
  Administered 2015-05-06: 10 mmol via INTRAVENOUS
  Filled 2015-05-06: qty 3.33

## 2015-05-06 MED ORDER — SODIUM CHLORIDE 0.9 % IV SOLN
1250.0000 mg | Freq: Two times a day (BID) | INTRAVENOUS | Status: DC
  Filled 2015-05-06: qty 1250

## 2015-05-06 MED ORDER — VANCOMYCIN HCL 10 G IV SOLR
1250.0000 mg | Freq: Once | INTRAVENOUS | Status: AC
  Administered 2015-05-06: 1250 mg via INTRAVENOUS
  Filled 2015-05-06: qty 1250

## 2015-05-06 NOTE — Progress Notes (Signed)
CXR report called. Patient has dense left lung pneumonia. Discussed with Dr. Waldron Labs.

## 2015-05-06 NOTE — Progress Notes (Signed)
3 Days Post-Op  Subjective: Patient sitting in chair, no distress. Looks somewhat fatigued.  Objective: Vital signs in last 24 hours: Temp:  [97.3 F (36.3 C)-98.3 F (36.8 C)] 98.3 F (36.8 C) (12/12 0807) Pulse Rate:  [106-144] 111 (12/12 0851) Resp:  [11-23] 15 (12/12 0800) BP: (99-177)/(56-97) 132/68 mmHg (12/12 0851) SpO2:  [86 %-100 %] 96 % (12/12 0800) Weight:  [60.3 kg (132 lb 15 oz)] 60.3 kg (132 lb 15 oz) (12/12 0500) Last BM Date: 05/02/15  Intake/Output from previous day: 12/11 0701 - 12/12 0700 In: 2810 [P.O.:1075; I.V.:1735] Out: 1200 [Urine:1200] Intake/Output this shift: Total I/O In: -  Out: 200 [Urine:200]  General appearance: cooperative, appears stated age and fatigued GI: Soft. Incision healing well. Occasional bowel sounds appreciated.  Lab Results:   Recent Labs  05/05/15 0443 05/06/15 0440  WBC 10.8* 16.4*  HGB 10.5* 10.1*  HCT 32.4* 31.5*  PLT 199 218   BMET  Recent Labs  05/05/15 0443 05/06/15 0440  NA 131* 133*  K 4.2 4.6  CL 101 102  CO2 23 23  GLUCOSE 124* 133*  BUN 10 15  CREATININE 1.16 1.33*  CALCIUM 8.0* 8.3*   PT/INR No results for input(s): LABPROT, INR in the last 72 hours.  Studies/Results: US Venous Img Lower Bilateral  05/05/2015  CLINICAL DATA:  Bilateral lower extremity pain EXAM: BILATERAL LOWER EXTREMITY VENOUS DUPLEX ULTRASOUND TECHNIQUE: Gray-scale sonography with graded compression, as well as color Doppler and duplex ultrasound were performed to evaluate the lower extremity deep venous systems from the level of the common femoral vein and including the common femoral, femoral, profunda femoral, popliteal and calf veins including the posterior tibial, peroneal and gastrocnemius veins when visible. The superficial great saphenous vein was also interrogated. Spectral Doppler was utilized to evaluate flow at rest and with distal augmentation maneuvers in the common femoral, femoral and popliteal veins. COMPARISON:   None. FINDINGS: RIGHT LOWER EXTREMITY Common Femoral Vein: No evidence of thrombus. Normal compressibility, respiratory phasicity and response to augmentation. Saphenofemoral Junction: No evidence of thrombus. Normal compressibility and flow on color Doppler imaging. Profunda Femoral Vein: No evidence of thrombus. Normal compressibility and flow on color Doppler imaging. Femoral Vein: No evidence of thrombus. Normal compressibility, respiratory phasicity and response to augmentation. Popliteal Vein: No evidence of thrombus. Normal compressibility, respiratory phasicity and response to augmentation. Calf Veins: No evidence of thrombus. Normal compressibility and flow on color Doppler imaging. Superficial Great Saphenous Vein: No evidence of thrombus. Normal compressibility and flow on color Doppler imaging. Venous Reflux:  None. Other Findings:  None. LEFT LOWER EXTREMITY Common Femoral Vein: No evidence of thrombus. Normal compressibility, respiratory phasicity and response to augmentation. Saphenofemoral Junction: No evidence of thrombus. Normal compressibility and flow on color Doppler imaging. Profunda Femoral Vein: No evidence of thrombus. Normal compressibility and flow on color Doppler imaging. Femoral Vein: No evidence of thrombus. Normal compressibility, respiratory phasicity and response to augmentation. Popliteal Vein: No evidence of thrombus. Normal compressibility, respiratory phasicity and response to augmentation. Calf Veins: No evidence of thrombus. Normal compressibility and flow on color Doppler imaging. Superficial Great Saphenous Vein: No evidence of thrombus. Normal compressibility and flow on color Doppler imaging. Venous Reflux:  None. Other Findings:  None. IMPRESSION: No evidence of deep venous thrombosis in either lower extremity. Electronically Signed   By: Lowella Grip III M.D.   On: 05/05/2015 10:33   Dg Chest Port 1 View  05/06/2015  CLINICAL DATA:  Shortness of breath.  Leukocytosis. Recent  abdominal surgery. EXAM: PORTABLE CHEST 1 VIEW COMPARISON:  04/04/2015. FINDINGS: Prior CABG. Heart size stable. Dense left lung diffuse infiltrate noted consistent with pneumonia. Prominent areas of bilateral subsegmental atelectasis. Free intraperitoneal air, most likely from recent abdominal surgery . Scratched Clinical correlation is suggested. Abdominal series can be obtained for further evaluation. IMPRESSION: 1. Free intraperitoneal air, most likely from recent abdominal surgery. Clinical correlation suggested. Abdominal series can be obtained for further evaluation . 2. Diffuse dense left lung infiltrate consistent pneumonia. Multifocal bilateral subsegmental atelectasis . Critical Value/emergent results were called by telephone at the time of interpretation on 05/06/2015 at 8:41 am to nurse Anderson Malta, who verbally acknowledged these results. Electronically Signed   By: Marcello Moores  Register   On: 05/06/2015 08:43    Anti-infectives: Anti-infectives    Start     Dose/Rate Route Frequency Ordered Stop   05/06/15 0915  piperacillin-tazobactam (ZOSYN) IVPB 3.375 g     3.375 g 12.5 mL/hr over 240 Minutes Intravenous Every 8 hours 05/06/15 0908     05/06/15 0915  vancomycin (VANCOCIN) 1,250 mg in sodium chloride 0.9 % 250 mL IVPB     1,250 mg 166.7 mL/hr over 90 Minutes Intravenous Every 12 hours 05/06/15 0909     05/03/15 1146  metroNIDAZOLE (FLAGYL) IVPB 500 mg     500 mg 100 mL/hr over 60 Minutes Intravenous On call to O.R. 05/03/15 1146 05/03/15 1224   05/03/15 1146  ciprofloxacin (CIPRO) IVPB 400 mg     400 mg 200 mL/hr over 60 Minutes Intravenous On call to O.R. 05/03/15 1146 05/03/15 1200      Assessment/Plan: s/p Procedure(s):  PARTIAL SMALL BOWEL RESECTION Impression: Patient has developed left sided pneumonia. Hemoglobin is stable. Patient none full liquid diet. Will advance diet once patient has a bowel movement. Bowel function has not fully returned yet. Final  pathology pending.  LOS: 9 days    Dracen Reigle A 05/06/2015

## 2015-05-06 NOTE — Progress Notes (Signed)
ANTIBIOTIC CONSULT NOTE - INITIAL  Pharmacy Consult for vancomycin and zosyn Indication: pneumonia  Allergies  Allergen Reactions  . Ambien [Zolpidem Tartrate] Other (See Comments)    May cause agitation and confusion per family  . Aspirin Other (See Comments)    unknown  . Morphine And Related Other (See Comments)    Causes increased confusion    Patient Measurements: Height: 5\' 5"  (165.1 cm) Weight: 132 lb 15 oz (60.3 kg) IBW/kg (Calculated) : 61.5   Vital Signs: Temp: 98.3 F (36.8 C) (12/12 0807) Temp Source: Axillary (12/12 0807) BP: 132/68 mmHg (12/12 0851) Pulse Rate: 111 (12/12 0851) Intake/Output from previous day: 12/11 0701 - 12/12 0700 In: 2810 [P.O.:1075; I.V.:1735] Out: 1200 [Urine:1200] Intake/Output from this shift: Total I/O In: -  Out: 200 [Urine:200]  Labs:  Recent Labs  05/04/15 0432 05/05/15 0443 05/06/15 0440  WBC 9.2 10.8* 16.4*  HGB 10.6* 10.5* 10.1*  PLT 208 199 218  CREATININE 1.31* 1.16 1.33*   Estimated Creatinine Clearance: 36.5 mL/min (by C-G formula based on Cr of 1.33). No results for input(s): VANCOTROUGH, VANCOPEAK, VANCORANDOM, GENTTROUGH, GENTPEAK, GENTRANDOM, TOBRATROUGH, TOBRAPEAK, TOBRARND, AMIKACINPEAK, AMIKACINTROU, AMIKACIN in the last 72 hours.   Microbiology: Recent Results (from the past 720 hour(s))  MRSA PCR Screening     Status: None   Collection Time: 05/02/15 11:45 AM  Result Value Ref Range Status   MRSA by PCR NEGATIVE NEGATIVE Final    Comment:        The GeneXpert MRSA Assay (FDA approved for NASAL specimens only), is one component of a comprehensive MRSA colonization surveillance program. It is not intended to diagnose MRSA infection nor to guide or monitor treatment for MRSA infections.     Medical History: Past Medical History  Diagnosis Date  . Diabetes mellitus, type 2 ([place])   . Essential hypertension, benign   . Coronary atherosclerosis of native coronary artery 1992    Multivessel  s/p CABG 1992  . Hyperlipidemia   . TIA (transient ischemic attack)   . History of pneumonia   . Benign prostatic hypertrophy   . Chronic anemia     Medications:  Prescriptions prior to admission  Medication Sig Dispense Refill Last Dose  . acetaminophen (TYLENOL) 650 MG CR tablet Take 650 mg by mouth every 8 (eight) hours as needed for pain.   04/26/2015 at Unknown time  . albuterol (PROVENTIL HFA;VENTOLIN HFA) 108 (90 BASE) MCG/ACT inhaler Inhale 2 puffs into the lungs every 6 (six) hours as needed for wheezing or shortness of breath.   unknown  . Cholecalciferol (VITAMIN D PO) Take 1 capsule by mouth daily.   04/27/2015 at Unknown time  . clonazePAM (KLONOPIN) 0.5 MG tablet Take 1 mg by mouth at bedtime.   0 04/26/2015 at Unknown time  . Docusate Sodium (RA COL-RITE PO) Take 2 capsules by mouth at bedtime.   04/26/2015 at Unknown time  . ferrous sulfate 325 (65 FE) MG tablet Take 1 tablet (325 mg total) by mouth daily with breakfast. 30 tablet 0 04/27/2015 at Unknown time  . finasteride (PROSCAR) 5 MG tablet Take 1 tablet by mouth daily.  0 04/27/2015 at Unknown time  . Magnesium 200 MG TABS Take 1 tablet by mouth daily.   04/27/2015 at Unknown time  . metFORMIN (GLUCOPHAGE) 500 MG tablet Take 500 mg by mouth 2 (two) times daily with a meal.    04/27/2015 at Unknown time  . Multiple Vitamins-Minerals (ONE-A-DAY 50 PLUS PO) Take 1 tablet by  mouth daily.   04/27/2015 at Unknown time  . Omega-3 Fatty Acids (FISH OIL) 1200 MG CAPS Take 1 capsule by mouth daily.   04/27/2015 at Unknown time  . pantoprazole (PROTONIX) 40 MG tablet Take 1 tablet (40 mg total) by mouth daily before breakfast. 30 tablet 1 04/27/2015 at Unknown time  . quinapril (ACCUPRIL) 20 MG tablet take 1 tablet by mouth once daily 30 tablet 11 04/27/2015 at Unknown time  . Tamsulosin HCl (FLOMAX) 0.4 MG CAPS Take 0.4 mg by mouth daily.    04/27/2015 at Unknown time  . traMADol (ULTRAM) 50 MG tablet Take 1 tablet by mouth every 6 (six)  hours as needed for moderate pain.   0 04/26/2015 at Unknown time  . vitamin B-12 (CYANOCOBALAMIN) 1000 MCG tablet Take 1,000 mcg by mouth daily.   04/27/2015 at Unknown time  . cyclobenzaprine (FLEXERIL) 5 MG tablet Take 1 tablet (5 mg total) by mouth 3 (three) times daily as needed for muscle spasms. (Patient not taking: Reported on 04/27/2015) 30 tablet 0 Completed Course at Unknown time  . levofloxacin (LEVAQUIN) 250 MG tablet Take by mouth daily. Starting 04/13/2015, 2 tablets on the first day, then 1 tablet daily for 13 days.  0 Completed Course at Unknown time   Assessment: 81 yo man to start broad spectrum antibiotics for PNA.    Goal of Therapy:  Vancomycin trough level 15-20 mcg/ml  Plan:  Vancomycin 1250 mg IV X 1 then 1 gm IV q24 hours Zosyn 3.375 gm IV q8 hours F/u renal function, cultures and clinical course  Thanks for allowing pharmacy to be a part of this patient's care.  Excell Seltzer, PharmD Clinical Pharmacist 05/06/2015,9:10 AM

## 2015-05-06 NOTE — Progress Notes (Addendum)
Patient Demographics  [name] [name], is a 81 y.o. male, DOB - 1931-01-06, [id number]  Admit date - 04/27/2015   Admitting Physician Kathie Dike, MD  Outpatient Primary MD for the patient is [name], MD  LOS - 9   Chief Complaint  Patient presents with  . Back Pain       Admission HPI/Brief narrative: 81 year old male with a hx of anemia, DM type 2, HTN, CAD, HLD, and CKD stage III that presents with chronic anemia with occult GI bleeding. Transfused 2 units PRBC,Colonoscopy was performed which revealed an extrinsic mass pushing on the colon. A CT scan  reveals a possible small bowel neoplasm or inflammatory mass in the distal small bowel in the pelvis, patient had exploratory laparotomy with partial small bowel resection on 12/9 by Dr. Arnoldo Morale, recovering well postop, unfortunately patient developd HCAP, acted on vac and Zosyn 12/12.  Subjective:   [name] today has, No headache, No chest pain,No Nausea,No Cough, abdominal pain significantly improved , and episodes of agitation/delirium overnight . Assessment & Plan    Active Problems:   Diabetes mellitus, type 2 (H[facility])   Essential hypertension, benign   Symptomatic anemia   Acute renal failure superimposed on stage 3 chronic kidney disease (HCC)   Dehydration   Fecal impaction in rectum (HCC)   Constipation   Falls   Lumbar compression fracture ([facility])   Hyperkalemia   Hyponatremia   BPH (benign prostatic hypertrophy)   Protein-calorie malnutrition, severe   Small bowel mass   Unilateral recurrent inguinal hernia without obstruction or gangrene   Abnormality of rectum    Mass in distal small bowel - CT abdomen and pelvis with evidence of masslike lesion in distal small bowel. - Cardiology consulted appreciated, patient is intermediate risk for surgery. - Surgical consult greatly appreciated, status post exploratory  laparotomy  with partial small bowel resection on 12/9 by Dr. Arnoldo Morale, findings are suspicious for significantly inflamed Meckel diverticulum, but will need to await final biopsy result given reactive lymph node, and significantly enlarged/inflamed mass, pathology results still pending. - Further management per surgery, advanced to soft diet today.   symptomatic iron deficiency anemia  - Transfused 2 units PRBC 12/3,  hemoglobin remained stable, iron 16, ferritin WNL,this  is most likely due to chronic GI blood loss secondary to small bowel mass . -  transfused another 2 units PRBC 12/8 in anticipation for surgery.  Sepsis /HCAP - Patient with leukocytosis this a.m. and mild tachycardia, repeat chest x-ray showing dense left lung opacity, will be started on IV vancomycin and Zosyn for HCAP, will start on Mucinex, chest PT. - Will request SLP evaluation as well.  Urinary retention -no recurrence , resumed on Flomax given his known history of BPH.  Sinus tachycardia - bilateral venous Doppler negative for DVT. - Most likely related to sepsis from [facility] .  Diabetes mellitus - CBG controlled on insulin  sliding scale,   Hypertension - on  when necessary hydralazine,  started on low dose metoprolol as blood pressure started to increase.  Hyponatremia/hypokalemia/hypomagnesemia - Replete  as needed  Fecal impaction/rectal ulcers - resolved with enema - Rectal ulcers biopsy is benign, likely related to constipation  Delirium - patient continues to have  episodes of acute delirium overnight, yesterday after receiving morphine for pain, try to change morphine to something oral, continues to have these episodes will start on low dose Seroquel at night.  Anorexia - started on Remeron, continue supplement.  CAD - Has any chest pain or shortness of breath  Chronic kidney disease stage III - Continue to monitor, at baseline  Code Status: Full  Family Communication: Daughter at bedside    Disposition [facility] in ICU.  Procedures  Colonoscopy  exploratory laparotomy with partial small bowel resection on 12/9 by Dr. Manus Rudd   Gastroenterology   general surgery   cardiology   Medications  Scheduled Meds: . antiseptic oral rinse  7 mL Mouth Rinse BID  . clonazePAM  1 mg Oral QHS  . enoxaparin (LOVENOX) injection  30 mg Subcutaneous Q24H  . feeding supplement (ENSURE ENLIVE)  237 mL Oral BID BM  . guaiFENesin  1,200 mg Oral BID  . Influenza vac split quadrivalent PF  0.5 mL Intramuscular Tomorrow-1000  . insulin aspart  0-15 Units Subcutaneous TID WC  . metoprolol tartrate  25 mg Oral BID  . mirtazapine  15 mg Oral QHS  . pantoprazole  40 mg Oral Daily  . piperacillin-tazobactam (ZOSYN)  IV  3.375 g Intravenous Q8H  . pneumococcal 23 valent vaccine  0.5 mL Intramuscular Tomorrow-1000  . sodium chloride  3 mL Intravenous Q12H  . sodium phosphate  Dextrose 5% IVPB  10 mmol Intravenous Once  . tamsulosin  0.4 mg Oral Daily  . vancomycin  1,250 mg Intravenous Q12H   Continuous Infusions: . sodium chloride 75 mL/hr at 05/06/15 0309   PRN Meds:.acetaminophen **OR** acetaminophen, haloperidol lactate, hydrALAZINE, HYDROcodone-acetaminophen, morphine injection, ondansetron **OR** ondansetron (ZOFRAN) IV, simethicone  DVT Prophylaxis  SCDs , Lovenox  Lab Results  Component Value Date   PLT 218 05/06/2015    Antibiotics   Anti-infectives    Start     Dose/Rate Route Frequency Ordered Stop   05/06/15 0915  piperacillin-tazobactam (ZOSYN) IVPB 3.375 g     3.375 g 12.5 mL/hr over 240 Minutes Intravenous Every 8 hours 05/06/15 0908     05/06/15 0915  vancomycin (VANCOCIN) 1,250 mg in sodium chloride 0.9 % 250 mL IVPB     1,250 mg 166.7 mL/hr over 90 Minutes Intravenous Every 12 hours 05/06/15 0909     05/03/15 1146  metroNIDAZOLE (FLAGYL) IVPB 500 mg     500 mg 100 mL/hr over 60 Minutes Intravenous On call to O.R. 05/03/15 1146 05/03/15 1224   05/03/15  1146  ciprofloxacin (CIPRO) IVPB 400 mg     400 mg 200 mL/hr over 60 Minutes Intravenous On call to O.R. 05/03/15 1146 05/03/15 1200          Objective:   Filed Vitals:   05/06/15 0735 05/06/15 0800 05/06/15 0807 05/06/15 0851  BP: 117/70 132/68  132/68  Pulse: 108 117  111  Temp:   98.3 F (36.8 C)   TempSrc:   Axillary   Resp: 14 15    Height:      Weight:      SpO2: 97% 96%      Wt Readings from Last 3 Encounters:  05/06/15 60.3 kg (132 lb 15 oz)  04/04/15 59 kg (130 lb 1.1 oz)  03/21/15 61.78 kg (136 lb 3.2 oz)     Intake/Output Summary (Last 24 hours) at 05/06/15 0910 Last data filed at 05/06/15 0821  Gross per 24 hour  Intake   2570  ml  Output   1200 ml  Net   1370 ml     Physical Exam General: NAD,more lethargic today. Cardiovascular:  tachycardic , S1/S2 , no murmur. Respiratory: no use of accessory muscle , no wheezing,  left basilar Rales . Abdomen: soft, midline surgical scar covered with mesh, no distention , bowel sounds present Musculoskeletal: No edema b/l, good pulses     Data Review   Micro Results Recent Results (from the past 240 hour(s))  MRSA PCR Screening     Status: None   Collection Time: 05/02/15 11:45 AM  Result Value Ref Range Status   MRSA by PCR NEGATIVE NEGATIVE Final    Comment:        The GeneXpert MRSA Assay (FDA approved for NASAL specimens only), is one component of a comprehensive MRSA colonization surveillance program. It is not intended to diagnose MRSA infection nor to guide or monitor treatment for MRSA infections.     Radiology Reports Dg Lumbar Spine Complete  04/27/2015  CLINICAL DATA:  Low back pain.  Recent falls. EXAM: LUMBAR SPINE - COMPLETE 4+ VIEW COMPARISON:  04/04/2015 FINDINGS: There is an L4 body fracture with superior and inferior endplate compression deformities. Height loss is approximately 25% at maximum. An L1 superior endplate fracture is chronic. No subluxation. Spondylosis without  focal or advanced disc narrowing. Extensive aortic atherosclerosis. Large stool volume, especially in the rectum. Osteopenia. IMPRESSION: 1. L4 vertebral body acute fracture with mild height loss. 2. Large stool volume with potential rectal impaction. Electronically Signed   By: Monte Fantasia M.D.   On: 04/27/2015 14:12   Ct Abdomen Pelvis W Contrast  04/30/2015  CLINICAL DATA:  Incomplete colonoscopy. EXAM: CT ABDOMEN AND PELVIS WITH CONTRAST TECHNIQUE: Multidetector CT imaging of the abdomen and pelvis was performed using the standard protocol following bolus administration of intravenous contrast. CONTRAST:  133mL OMNIPAQUE IOHEXOL 300 MG/ML  SOLN COMPARISON:  None. FINDINGS: Lower chest: Chronic appearing pleural thickening with calcification overlies the posterior left lung base. Overlying scar versus atelectasis noted. 3 mm right middle lobe lung nodule is identified, image number 3 of series 6. Hepatobiliary: Mild diffuse hepatic steatosis. The gallbladder appears normal. No biliary dilatation. Pancreas: Normal appearance of the pancreas. Spleen: The spleen is unremarkable. Adrenals/Urinary Tract: The adrenal glands are normal. Bilateral renal cysts are identified. The urinary bladder appears normal. Stomach/Bowel: The stomach appears normal. Small bowel loops have a normal course and caliber. There is no evidence for a bowel obstruction. Circumferential mass involving the pelvic small bowel loop is identified. This measures 5.3 x 3.9 x 4.3 cm and results in moderate narrowing of the small bowel lumen, image 61 of series 2 and image 62 of series 3. There is a large left inguinal hernia which contains a loop of sigmoid colon. Sigmoid colon diverticula noted. Vascular/Lymphatic: Calcified atherosclerotic disease involves the abdominal aorta. No aneurysm. Enlarged lymph node within the lower abdominal ileocolic mesentery measures 1.4 cm, image 50 of series 2. Reproductive: Mild prostate gland enlargement.  Other: No free fluid or fluid collections identified. No peritoneal nodule or mass. Musculoskeletal: Degenerative disc disease noted within the lumbar spine. No aggressive lytic or sclerotic bone lesions. IMPRESSION: 1. Suspicious mass involving a loop of distal small bowel, likely ileum is identified and worrisome for small bowel neoplasm. This does not resolve then any significant small bowel obstruction. Surgical consultation suggested. 2. Enlarged ileocolic lymph node within the lower abdomen may represent a focus of metastatic adenopathy. 3. Left inguinal hernia  contains a loop of sigmoid colon which may account for difficulty with colonoscopy. 4. Aortic atherosclerosis. 5. Hepatic steatosis. Electronically Signed   By: Kerby Moors M.D.   On: 04/30/2015 12:12   US Venous Img Lower Bilateral  05/05/2015  CLINICAL DATA:  Bilateral lower extremity pain EXAM: BILATERAL LOWER EXTREMITY VENOUS DUPLEX ULTRASOUND TECHNIQUE: Gray-scale sonography with graded compression, as well as color Doppler and duplex ultrasound were performed to evaluate the lower extremity deep venous systems from the level of the common femoral vein and including the common femoral, femoral, profunda femoral, popliteal and calf veins including the posterior tibial, peroneal and gastrocnemius veins when visible. The superficial great saphenous vein was also interrogated. Spectral Doppler was utilized to evaluate flow at rest and with distal augmentation maneuvers in the common femoral, femoral and popliteal veins. COMPARISON:  None. FINDINGS: RIGHT LOWER EXTREMITY Common Femoral Vein: No evidence of thrombus. Normal compressibility, respiratory phasicity and response to augmentation. Saphenofemoral Junction: No evidence of thrombus. Normal compressibility and flow on color Doppler imaging. Profunda Femoral Vein: No evidence of thrombus. Normal compressibility and flow on color Doppler imaging. Femoral Vein: No evidence of thrombus. Normal  compressibility, respiratory phasicity and response to augmentation. Popliteal Vein: No evidence of thrombus. Normal compressibility, respiratory phasicity and response to augmentation. Calf Veins: No evidence of thrombus. Normal compressibility and flow on color Doppler imaging. Superficial Great Saphenous Vein: No evidence of thrombus. Normal compressibility and flow on color Doppler imaging. Venous Reflux:  None. Other Findings:  None. LEFT LOWER EXTREMITY Common Femoral Vein: No evidence of thrombus. Normal compressibility, respiratory phasicity and response to augmentation. Saphenofemoral Junction: No evidence of thrombus. Normal compressibility and flow on color Doppler imaging. Profunda Femoral Vein: No evidence of thrombus. Normal compressibility and flow on color Doppler imaging. Femoral Vein: No evidence of thrombus. Normal compressibility, respiratory phasicity and response to augmentation. Popliteal Vein: No evidence of thrombus. Normal compressibility, respiratory phasicity and response to augmentation. Calf Veins: No evidence of thrombus. Normal compressibility and flow on color Doppler imaging. Superficial Great Saphenous Vein: No evidence of thrombus. Normal compressibility and flow on color Doppler imaging. Venous Reflux:  None. Other Findings:  None. IMPRESSION: No evidence of deep venous thrombosis in either lower extremity. Electronically Signed   By: Lowella Grip III M.D.   On: 05/05/2015 10:33   Dg Chest Port 1 View  05/06/2015  CLINICAL DATA:  Shortness of breath. Leukocytosis. Recent abdominal surgery. EXAM: PORTABLE CHEST 1 VIEW COMPARISON:  04/04/2015. FINDINGS: Prior CABG. Heart size stable. Dense left lung diffuse infiltrate noted consistent with pneumonia. Prominent areas of bilateral subsegmental atelectasis. Free intraperitoneal air, most likely from recent abdominal surgery . Scratched Clinical correlation is suggested. Abdominal series can be obtained for further evaluation.  IMPRESSION: 1. Free intraperitoneal air, most likely from recent abdominal surgery. Clinical correlation suggested. Abdominal series can be obtained for further evaluation . 2. Diffuse dense left lung infiltrate consistent pneumonia. Multifocal bilateral subsegmental atelectasis . Critical Value/emergent results were called by telephone at the time of interpretation on 05/06/2015 at 8:41 am to nurse Anderson Malta, who verbally acknowledged these results. Electronically Signed   By: Marcello Moores  Register   On: 05/06/2015 08:43     CBC  Recent Labs Lab 05/03/15 0546 05/03/15 0733 05/04/15 0432 05/05/15 0443 05/06/15 0440  WBC 13.2* 11.7* 9.2 10.8* 16.4*  HGB 11.5* 10.9* 10.6* 10.5* 10.1*  HCT 35.1* 33.1* 32.8* 32.4* 31.5*  PLT 236 198 208 199 218  MCV 86.9 86.0 88.4 87.1 87.5  MCH 28.5 28.3 28.6 28.2 28.1  MCHC 32.8 32.9 32.3 32.4 32.1  RDW 16.6* 16.5* 15.8* 15.6* 15.5    Chemistries   Recent Labs Lab 05/02/15 0619 05/03/15 0546 05/04/15 0432 05/05/15 0443 05/06/15 0440  NA 136 134* 134* 131* 133*  K 4.6 4.3 4.2 4.2 4.6  CL 103 99* 101 101 102  CO2 27 27 25 23 23   GLUCOSE 129* 120* 135* 124* 133*  BUN 15 16 13 10 15   CREATININE 1.33* 1.41* 1.31* 1.16 1.33*  CALCIUM 8.7* 8.8* 8.2* 8.0* 8.3*  MG  --   --  1.3* 1.8 1.7   ------------------------------------------------------------------------------------------------------------------ estimated creatinine clearance is 36.5 mL/min (by C-G formula based on Cr of 1.33). ------------------------------------------------------------------------------------------------------------------ No results for input(s): HGBA1C in the last 72 hours. ------------------------------------------------------------------------------------------------------------------ No results for input(s): CHOL, HDL, LDLCALC, TRIG, CHOLHDL, LDLDIRECT in the last 72  hours. ------------------------------------------------------------------------------------------------------------------ No results for input(s): TSH, T4TOTAL, T3FREE, THYROIDAB in the last 72 hours.  Invalid input(s): FREET3 ------------------------------------------------------------------------------------------------------------------ No results for input(s): VITAMINB12, FOLATE, FERRITIN, TIBC, IRON, RETICCTPCT in the last 72 hours.  Coagulation profile No results for input(s): INR, PROTIME in the last 168 hours.  No results for input(s): DDIMER in the last 72 hours.  Cardiac Enzymes No results for input(s): CKMB, TROPONINI, MYOGLOBIN in the last 168 hours.  Invalid input(s): CK ------------------------------------------------------------------------------------------------------------------ Invalid input(s): POCBNP     Time Spent in minutes   35 minutes   ELGERGAWY, DAWOOD M.D on 05/06/2015 at 9:10 AM  Between 7am to 7pm - Pager - [phone]  After 7pm go to [email or web address] - password [facility]  [facility]   Office  [phone]

## 2015-05-06 NOTE — Evaluation (Signed)
Clinical/Bedside Swallow Evaluation Patient Details  Name: [name] [name] MRN: [id number] Date of Birth: Jan 20, 1931  Today's Date: 05/06/2015 Time: SLP Start Time (ACUTE ONLY): 3 SLP Stop Time (ACUTE ONLY): 1200 SLP Time Calculation (min) (ACUTE ONLY): 30 min  Past Medical History:  Past Medical History  Diagnosis Date  . Diabetes mellitus, type 2 ([place])   . Essential hypertension, benign   . Coronary atherosclerosis of native coronary artery 1992    Multivessel s/p CABG 1992  . Hyperlipidemia   . TIA (transient ischemic attack)   . History of pneumonia   . Benign prostatic hypertrophy   . Chronic anemia    Past Surgical History:  Past Surgical History  Procedure Laterality Date  . Coronary artery bypass graft  1992  . Tonsillectomy    . Hernia repair    . Cataract extraction Bilateral   . Colonoscopy N/A 02/24/2015    INCOMPLETE DUE TO REDUNDANT LEFT COLON  . Esophagogastroduodenoscopy N/A 02/24/2015    ATROPHIC GASTRITIS, DUODENITIS  . Colonoscopy N/A 04/29/2015    Procedure: COLONOSCOPY;  Surgeon: Daneil Dolin, MD;  Location: [facility];  Service: Endoscopy;  Laterality: N/A;  . Bowel resection N/A 05/03/2015    Procedure:  PARTIAL SMALL BOWEL RESECTION;  Surgeon: Aviva Signs, MD;  Location: [facility];  Service: General;  Laterality: N/A;   HPI:  [name] is an 81 year old male with a hx of anemia, DM type 2, HTN, CAD, HLD, and CKD stage III that presents with chronic anemia with occult GI bleeding. Transfused 2 units PRBC,Colonoscopy was performed which revealed an extrinsic mass pushing on the colon. A CT scan reveals a possible small bowel neoplasm or inflammatory mass in the distal small bowel in the pelvis, patient had exploratory laparotomy with partial small bowel resection on 12/9 by Dr. Arnoldo Morale, recovering well postop, unfortunately patient developd HCAP, acted on vac and Zosyn 12/12. SLP asked to evaluate swallow.   Assessment / Plan /  Recommendation Clinical Impression  [name] [name] was lethargic, but cooperative during clinical swallow evaluation. RN reports pt with confusion today. Chest x-ray completed this AM shows: Diffuse dense left lung infiltrate consistent pneumonia. Multifocal bilateral subsegmental atelectasis. Oral care recently completed by RN. Pt followed 1-step directions with delays. Pt presents with weak, congested cough and seemingly decreased hyolaryngeal excursion. Multiple swallows elicited with presentations of thin liquids. Pt with weak, delayed cough after sequential swallows thin. RN reports weak cough and congestion present prior to po this date. Pt swallows multiple times for each swallow and occasionally audible. Pt appears very weak at this time. His intake from lunch tray was limited and pt declined additional po beyond 1/3 of pudding and small sip of cream soup. He did take several swallows of nectar-thick orange juice and was cued to clear throat periodically and to "swallow hard", which pt complied. Recommend proceeding cautiously and downgrading diet to nectar-thick liquids (full liquids per surgery and upgrade to puree when ok by MD). PO medications presented whole in puree or nectar-thick liquid. Encourage pt to swallow hard and clear throat periodically. SLP will follow up tomorrow. Above to [place], Therapist, sports.     Aspiration Risk  Moderate aspiration risk    Diet Recommendation Dysphagia 1 (Puree);Nectar-thick liquid   Liquid Administration via: Cup;Straw Medication Administration:  (whole with NTL or puree) Supervision: Staff to assist with self feeding;Full supervision/cueing for compensatory strategies Compensations: Slow rate;Small sips/bites;Multiple dry swallows after each bite/sip;Clear throat intermittently;Hard cough after swallow;Effortful swallow Postural  Changes: Seated upright at 90 degrees;Remain upright for at least 30 minutes after po intake    Other  Recommendations Oral Care  Recommendations: Oral care BID;Oral care prior to ice chip/H20;Staff/trained caregiver to provide oral care (ok for single ice chips after oral care when presented by RN) Other Recommendations: Order thickener from pharmacy;Clarify dietary restrictions   Follow up Recommendations  Skilled Nursing facility    Frequency and Duration min 2x/week  2 weeks       Prognosis Prognosis for Safe Diet Advancement: [place] Date of Onset: 04/27/15 HPI: [name] [name] is an 81 year old male with a hx of anemia, DM type 2, HTN, CAD, HLD, and CKD stage III that presents with chronic anemia with occult GI bleeding. Transfused 2 units PRBC,Colonoscopy was performed which revealed an extrinsic mass pushing on the colon. A CT scan reveals a possible small bowel neoplasm or inflammatory mass in the distal small bowel in the pelvis, patient had exploratory laparotomy with partial small bowel resection on 12/9 by Dr. Arnoldo Morale, recovering well postop, unfortunately patient developd HCAP, acted on vac and Zosyn 12/12. SLP asked to evaluate swallow. Type of Study: Bedside Swallow Evaluation Diet Prior to this Study: Thin liquids (full liquids) Temperature Spikes Noted: No Respiratory Status: Nasal cannula History of Recent Intubation: Yes Length of Intubations (days): 1 days Date extubated: 05/03/15 Behavior/Cognition: Lethargic/Drowsy;Cooperative Oral Cavity Assessment: Within Functional Limits Oral Care Completed by SLP: Recent completion by staff Oral Cavity - Dentition: Adequate natural dentition Vision: Functional for self-feeding Self-Feeding Abilities: Needs assist Patient Positioning: Upright in bed Baseline Vocal Quality: Normal;Breathy Volitional Cough: Weak;Congested Volitional Swallow: Able to elicit    Oral/Motor/Sensory Function Overall Oral Motor/Sensory Function: Within functional limits   Ice Chips Ice chips: Within functional limits Presentation: Spoon   Thin  Liquid Thin Liquid: Impaired Presentation: Cup;Straw Pharyngeal  Phase Impairments: Suspected delayed Swallow;Decreased hyoid-laryngeal movement;Multiple swallows;Cough - Immediate;Cough - Delayed    Nectar Thick Nectar Thick Liquid: Within functional limits Presentation: Cup;Straw;Spoon   Honey Thick Honey Thick Liquid: Not tested   Puree Puree: Impaired Presentation: Spoon Pharyngeal Phase Impairments: Decreased hyoid-laryngeal movement;Cough - Delayed   Solid Solid: Not tested      Thank you,  Genene Churn, [place]  Meghanne Pletz 05/06/2015,12:20 PM

## 2015-05-06 NOTE — Progress Notes (Signed)
Patient up to chair at this time. Chair alarm in place. Daughter at bedside. Urine sent and chest xray done.

## 2015-05-06 NOTE — Progress Notes (Signed)
Patient had nectar thickened liquids, soup, and pudding for dinner. Appeared to be tolerating well, but after dinner patient became increasingly congested sounding. Heart rate went up to 135 and O2 Sat to 89% with O2 at 2 liters by [place]. Oxygen increased to 3 liters by [place]. Heart rate 113 and O2 sat 91% at this time. Dr. Waldron Labs notified via text page.

## 2015-05-07 LAB — GLUCOSE, CAPILLARY
GLUCOSE-CAPILLARY: 111 mg/dL — AB (ref 65–99)
Glucose-Capillary: 112 mg/dL — ABNORMAL HIGH (ref 65–99)
Glucose-Capillary: 163 mg/dL — ABNORMAL HIGH (ref 65–99)
Glucose-Capillary: 96 mg/dL (ref 65–99)

## 2015-05-07 LAB — BASIC METABOLIC PANEL
Anion gap: 6 (ref 5–15)
BUN: 17 mg/dL (ref 6–20)
CALCIUM: 7.8 mg/dL — AB (ref 8.9–10.3)
CHLORIDE: 106 mmol/L (ref 101–111)
CO2: 24 mmol/L (ref 22–32)
CREATININE: 1.28 mg/dL — AB (ref 0.61–1.24)
GFR calc non Af Amer: 50 mL/min — ABNORMAL LOW (ref >60)
GFR, EST AFRICAN AMERICAN: 58 mL/min — AB (ref >60)
GLUCOSE: 131 mg/dL — AB (ref 65–99)
Potassium: 4.2 mmol/L (ref 3.5–5.1)
Sodium: 136 mmol/L (ref 135–145)

## 2015-05-07 LAB — CBC
HEMATOCRIT: 27.1 % — AB (ref 39.0–52.0)
HEMOGLOBIN: 8.7 g/dL — AB (ref 13.0–17.0)
MCH: 28.3 pg (ref 26.0–34.0)
MCHC: 32.1 g/dL (ref 30.0–36.0)
MCV: 88.3 fL (ref 78.0–100.0)
Platelets: 203 10*3/uL (ref 150–400)
RBC: 3.07 MIL/uL — AB (ref 4.22–5.81)
RDW: 15.7 % — ABNORMAL HIGH (ref 11.5–15.5)
WBC: 11.7 10*3/uL — ABNORMAL HIGH (ref 4.0–10.5)

## 2015-05-07 NOTE — Progress Notes (Signed)
Speech Language Pathology Treatment: Dysphagia  Patient Details Name: [name] [name] MRN: [id number] DOB: 1931/01/19 Today's Date: 05/07/2015 Time: [id number] SLP Time Calculation (min) (ACUTE ONLY): 24 min  Assessment / Plan / Recommendation Clinical Impression  [name] [name] was more alert today, however audible congestion persists. When SLP arrived, his daughter was at bedside and had given him a little bit of his lunch tray. His ensure had been placed over ice which waters it down to a thin liquid. Pt had a can of thickener in room and daughter was shown how to add thickener to drinks as needed. His lunch tray came with thin milk (breakfast tray still in room had NTL-milk). Pt continues to be very weak and has a difficult time producing strong protective cough. He did manage to expectorate a small amount of mucous. SLP encouraged use of oral suction as needed, however it was not set up correctly so RN said she would assist. Pt and daughter were encouraged to cue pt to cough/clear throat as able. Overall po intake very little today. Pt was admitted 04/27/2015, so I suspect intake has been limited for this length of time. If pt does not tolerate po diet, consider short term placement of small bore feeding tube. SLP will follow up tomorrow AM. Will consider MBSS tomorrow if pt able. Could try Magic Cups on tray- nutrition consult if not done already.    HPI HPI: [name] is an 81 year old male with a hx of anemia, DM type 2, HTN, CAD, HLD, and CKD stage III that presents with chronic anemia with occult GI bleeding. Transfused 2 units PRBC,Colonoscopy was performed which revealed an extrinsic mass pushing on the colon. A CT scan reveals a possible small bowel neoplasm or inflammatory mass in the distal small bowel in the pelvis, patient had exploratory laparotomy with partial small bowel resection on 12/9 by Dr. Arnoldo Morale, recovering well postop, unfortunately patient developd HCAP, acted on vac and Zosyn  12/12. SLP asked to evaluate swallow.      SLP Plan  Continue with current plan of care;MBS     Recommendations  Diet recommendations: Dysphagia 1 (puree);Nectar-thick liquid Liquids provided via: Cup Medication Administration: Crushed with puree (or whole with NTL) Supervision: Full supervision/cueing for compensatory strategies;Staff to assist with self feeding Compensations: Slow rate;Small sips/bites;Multiple dry swallows after each bite/sip;Clear throat intermittently;Hard cough after swallow;Effortful swallow Postural Changes and/or Swallow Maneuvers: Seated upright 90 degrees;Upright 30-60 min after meal              General recommendations: PT consult (when able to tolerate) Oral Care Recommendations: Oral care BID;Oral care prior to ice chip/H20;Staff/trained caregiver to provide oral care Follow up Recommendations: Skilled Nursing facility Plan: Continue with current plan of care;MBS   Kimi Bordeau 05/07/2015, 5:20 PM

## 2015-05-07 NOTE — Progress Notes (Addendum)
Patient Demographics  [name] [name], is a 81 y.o. male, DOB - 10/05/1930, [id number]  Admit date - 04/27/2015   Admitting Physician Kathie Dike, MD  Outpatient Primary MD for the patient is [name], MD  LOS - 10   Chief Complaint  Patient presents with  . Back Pain       Admission HPI/Brief narrative: 81 year old male with a hx of anemia, DM type 2, HTN, CAD, HLD, and CKD stage III that presents with chronic anemia with occult GI bleeding. Transfused 2 units PRBC,Colonoscopy was performed which revealed an extrinsic mass pushing on the colon. A CT scan  reveals a possible small bowel neoplasm or inflammatory mass in the distal small bowel in the pelvis, patient had exploratory laparotomy with partial small bowel resection on 12/9 by Dr. Arnoldo Morale, recovering well postop, unfortunately patient developd HCAP, started on vac and Zosyn 12/12.  Subjective:   [name] [name] today has, No headache, No chest pain,No Nausea,No Cough, abdominal pain significantly improved , no delerium overnight. Assessment & Plan    Active Problems:   Diabetes mellitus, type 2 (HCC)   Essential hypertension, benign   Symptomatic anemia   Acute renal failure superimposed on stage 3 chronic kidney disease (HCC)   Dehydration   Fecal impaction in rectum (HCC)   Constipation   Falls   Lumbar compression fracture (HCC)   Hyperkalemia   Hyponatremia   BPH (benign prostatic hypertrophy)   Protein-calorie malnutrition, severe   Small bowel mass   Unilateral recurrent inguinal hernia without obstruction or gangrene   Abnormality of rectum    Mass in distal small bowel - CT abdomen and pelvis with evidence of masslike lesion in distal small bowel. - Cardiology consulted appreciated, patient is intermediate risk for surgery. - Surgical consult greatly appreciated, status post exploratory  laparotomy with partial small  bowel resection on 12/9 by Dr. Arnoldo Morale,  - Discussed with pathology, findings suspicious for pleomorphic carcinoma, oncology consulted - Further management per surgery, on full liquid diet with thickened liquids.   symptomatic iron deficiency anemia  - Transfused 2 units PRBC 12/3,   iron 16, ferritin WNL,this  is most likely due to chronic GI blood loss secondary to small bowel mass . -  transfused another 2 units PRBC 12/8 in anticipation for surgery. - hemoglobin is 8.7 today, some delusional component,  will monitor closely, recheck CBC in a.m.Marland Kitchen  Sepsis /HCAP - Patient with leukocytosis and mild tachycardia, repeat chest x-ray showing dense left lung opacity,started on IV vancomycin and Zosyn 12/12 for HCAP,  - Mucinex, chest PT. - Moderate risk is aspiration by SLP, on full liquid diet with nectar thick liquid.  Urinary retention -no recurrence , resumed on Flomax given his known history of BPH.  Sinus tachycardia - bilateral venous Doppler negative for DVT. - Most likely related to sepsis from HCAP .  Diabetes mellitus - CBG controlled on insulin  sliding scale,   Hypertension - on  when necessary hydralazine,  started on low dose metoprolol as blood pressure started to increase.  Hyponatremia/hypokalemia/hypomagnesemia - Replete  as needed  Fecal impaction/rectal ulcers - resolved with enema - Rectal ulcers biopsy is benign, likely related to constipation  Delirium - No recurrence overnight, has  a Engineer, materials.  Anorexia - started on Remeron, continue supplement.  CAD - Has any chest pain or shortness of breath  Chronic kidney disease stage III - Continue to monitor, at baseline  Code Status: Full  Family Communication: none at bedside   Disposition Plan: Pending PT evaluation when patient is more stable.  Procedures  Colonoscopy  exploratory laparotomy with partial small bowel resection on 12/9 by Dr. Manus Rudd   Gastroenterology   general  surgery   cardiology   Medications  Scheduled Meds: . antiseptic oral rinse  7 mL Mouth Rinse BID  . clonazePAM  1 mg Oral QHS  . enoxaparin (LOVENOX) injection  30 mg Subcutaneous Q24H  . feeding supplement (ENSURE ENLIVE)  237 mL Oral BID BM  . guaiFENesin  1,200 mg Oral BID  . Influenza vac split quadrivalent PF  0.5 mL Intramuscular Tomorrow-1000  . insulin aspart  0-15 Units Subcutaneous TID WC  . metoprolol tartrate  12.5 mg Oral BID  . mirtazapine  15 mg Oral QHS  . pantoprazole  40 mg Oral Daily  . piperacillin-tazobactam (ZOSYN)  IV  3.375 g Intravenous Q8H  . pneumococcal 23 valent vaccine  0.5 mL Intramuscular Tomorrow-1000  . sodium chloride  3 mL Intravenous Q12H  . tamsulosin  0.4 mg Oral Daily  . vancomycin  1,000 mg Intravenous Q24H   Continuous Infusions: . sodium chloride 50 mL/hr at 05/07/15 0852   PRN Meds:.acetaminophen **OR** acetaminophen, haloperidol lactate, hydrALAZINE, HYDROcodone-acetaminophen, ondansetron **OR** ondansetron (ZOFRAN) IV, RESOURCE THICKENUP CLEAR, simethicone  DVT Prophylaxis  SCDs , Lovenox  Lab Results  Component Value Date   PLT 203 05/07/2015    Antibiotics   Anti-infectives    Start     Dose/Rate Route Frequency Ordered Stop   05/07/15 0900  vancomycin (VANCOCIN) IVPB 1000 mg/200 mL premix     1,000 mg 200 mL/hr over 60 Minutes Intravenous Every 24 hours 05/06/15 0925     05/06/15 0930  vancomycin (VANCOCIN) 1,250 mg in sodium chloride 0.9 % 250 mL IVPB     1,250 mg 166.7 mL/hr over 90 Minutes Intravenous  Once 05/06/15 0925 05/06/15 1152   05/06/15 0915  piperacillin-tazobactam (ZOSYN) IVPB 3.375 g     3.375 g 12.5 mL/hr over 240 Minutes Intravenous Every 8 hours 05/06/15 0908     05/06/15 0915  vancomycin (VANCOCIN) 1,250 mg in sodium chloride 0.9 % 250 mL IVPB  Status:  Discontinued     1,250 mg 166.7 mL/hr over 90 Minutes Intravenous Every 12 hours 05/06/15 0909 05/06/15 0925   05/03/15 1146  metroNIDAZOLE (FLAGYL)  IVPB 500 mg     500 mg 100 mL/hr over 60 Minutes Intravenous On call to O.R. 05/03/15 1146 05/03/15 1224   05/03/15 1146  ciprofloxacin (CIPRO) IVPB 400 mg     400 mg 200 mL/hr over 60 Minutes Intravenous On call to O.R. 05/03/15 1146 05/03/15 1200          Objective:   Filed Vitals:   05/06/15 1718 05/06/15 2028 05/07/15 0304 05/07/15 0533  BP:  121/53  120/47  Pulse:  115    Temp: 98.2 F (36.8 C) 97.9 F (36.6 C) 98.4 F (36.9 C) 98.3 F (36.8 C)  TempSrc: Axillary Oral Oral Oral  Resp:  20  18  Height:      Weight:      SpO2:  94%  93%    Wt Readings from Last 3 Encounters:  05/06/15 60.3 kg (132 lb 15  oz)  04/04/15 59 kg (130 lb 1.1 oz)  03/21/15 61.78 kg (136 lb 3.2 oz)     Intake/Output Summary (Last 24 hours) at 05/07/15 0948 Last data filed at 05/07/15 0700  Gross per 24 hour  Intake    700 ml  Output    600 ml  Net    100 ml     Physical Exam General: NAD, awake, communicative, remains confused. Cardiovascular:  tachycardic , S1/S2 , no murmur. Respiratory: no use of accessory muscle , no wheezing,  left Rales . Abdomen: soft, midline surgical scar covered with mesh, no distention , bowel sounds present Musculoskeletal: No edema b/l, good pulses     Data Review   Micro Results Recent Results (from the past 240 hour(s))  MRSA PCR Screening     Status: None   Collection Time: 05/02/15 11:45 AM  Result Value Ref Range Status   MRSA by PCR NEGATIVE NEGATIVE Final    Comment:        The GeneXpert MRSA Assay (FDA approved for NASAL specimens only), is one component of a comprehensive MRSA colonization surveillance program. It is not intended to diagnose MRSA infection nor to guide or monitor treatment for MRSA infections.     Radiology Reports Dg Lumbar Spine Complete  04/27/2015  CLINICAL DATA:  Low back pain.  Recent falls. EXAM: LUMBAR SPINE - COMPLETE 4+ VIEW COMPARISON:  04/04/2015 FINDINGS: There is an L4 body fracture with  superior and inferior endplate compression deformities. Height loss is approximately 25% at maximum. An L1 superior endplate fracture is chronic. No subluxation. Spondylosis without focal or advanced disc narrowing. Extensive aortic atherosclerosis. Large stool volume, especially in the rectum. Osteopenia. IMPRESSION: 1. L4 vertebral body acute fracture with mild height loss. 2. Large stool volume with potential rectal impaction. Electronically Signed   By: Monte Fantasia M.D.   On: 04/27/2015 14:12   Ct Abdomen Pelvis W Contrast  04/30/2015  CLINICAL DATA:  Incomplete colonoscopy. EXAM: CT ABDOMEN AND PELVIS WITH CONTRAST TECHNIQUE: Multidetector CT imaging of the abdomen and pelvis was performed using the standard protocol following bolus administration of intravenous contrast. CONTRAST:  15mL OMNIPAQUE IOHEXOL 300 MG/ML  SOLN COMPARISON:  None. FINDINGS: Lower chest: Chronic appearing pleural thickening with calcification overlies the posterior left lung base. Overlying scar versus atelectasis noted. 3 mm right middle lobe lung nodule is identified, image number 3 of series 6. Hepatobiliary: Mild diffuse hepatic steatosis. The gallbladder appears normal. No biliary dilatation. Pancreas: Normal appearance of the pancreas. Spleen: The spleen is unremarkable. Adrenals/Urinary Tract: The adrenal glands are normal. Bilateral renal cysts are identified. The urinary bladder appears normal. Stomach/Bowel: The stomach appears normal. Small bowel loops have a normal course and caliber. There is no evidence for a bowel obstruction. Circumferential mass involving the pelvic small bowel loop is identified. This measures 5.3 x 3.9 x 4.3 cm and results in moderate narrowing of the small bowel lumen, image 61 of series 2 and image 62 of series 3. There is a large left inguinal hernia which contains a loop of sigmoid colon. Sigmoid colon diverticula noted. Vascular/Lymphatic: Calcified atherosclerotic disease involves the  abdominal aorta. No aneurysm. Enlarged lymph node within the lower abdominal ileocolic mesentery measures 1.4 cm, image 50 of series 2. Reproductive: Mild prostate gland enlargement. Other: No free fluid or fluid collections identified. No peritoneal nodule or mass. Musculoskeletal: Degenerative disc disease noted within the lumbar spine. No aggressive lytic or sclerotic bone lesions. IMPRESSION: 1. Suspicious mass involving  a loop of distal small bowel, likely ileum is identified and worrisome for small bowel neoplasm. This does not resolve then any significant small bowel obstruction. Surgical consultation suggested. 2. Enlarged ileocolic lymph node within the lower abdomen may represent a focus of metastatic adenopathy. 3. Left inguinal hernia contains a loop of sigmoid colon which may account for difficulty with colonoscopy. 4. Aortic atherosclerosis. 5. Hepatic steatosis. Electronically Signed   By: Kerby Moors M.D.   On: 04/30/2015 12:12   US Venous Img Lower Bilateral  05/05/2015  CLINICAL DATA:  Bilateral lower extremity pain EXAM: BILATERAL LOWER EXTREMITY VENOUS DUPLEX ULTRASOUND TECHNIQUE: Gray-scale sonography with graded compression, as well as color Doppler and duplex ultrasound were performed to evaluate the lower extremity deep venous systems from the level of the common femoral vein and including the common femoral, femoral, profunda femoral, popliteal and calf veins including the posterior tibial, peroneal and gastrocnemius veins when visible. The superficial great saphenous vein was also interrogated. Spectral Doppler was utilized to evaluate flow at rest and with distal augmentation maneuvers in the common femoral, femoral and popliteal veins. COMPARISON:  None. FINDINGS: RIGHT LOWER EXTREMITY Common Femoral Vein: No evidence of thrombus. Normal compressibility, respiratory phasicity and response to augmentation. Saphenofemoral Junction: No evidence of thrombus. Normal compressibility and  flow on color Doppler imaging. Profunda Femoral Vein: No evidence of thrombus. Normal compressibility and flow on color Doppler imaging. Femoral Vein: No evidence of thrombus. Normal compressibility, respiratory phasicity and response to augmentation. Popliteal Vein: No evidence of thrombus. Normal compressibility, respiratory phasicity and response to augmentation. Calf Veins: No evidence of thrombus. Normal compressibility and flow on color Doppler imaging. Superficial Great Saphenous Vein: No evidence of thrombus. Normal compressibility and flow on color Doppler imaging. Venous Reflux:  None. Other Findings:  None. LEFT LOWER EXTREMITY Common Femoral Vein: No evidence of thrombus. Normal compressibility, respiratory phasicity and response to augmentation. Saphenofemoral Junction: No evidence of thrombus. Normal compressibility and flow on color Doppler imaging. Profunda Femoral Vein: No evidence of thrombus. Normal compressibility and flow on color Doppler imaging. Femoral Vein: No evidence of thrombus. Normal compressibility, respiratory phasicity and response to augmentation. Popliteal Vein: No evidence of thrombus. Normal compressibility, respiratory phasicity and response to augmentation. Calf Veins: No evidence of thrombus. Normal compressibility and flow on color Doppler imaging. Superficial Great Saphenous Vein: No evidence of thrombus. Normal compressibility and flow on color Doppler imaging. Venous Reflux:  None. Other Findings:  None. IMPRESSION: No evidence of deep venous thrombosis in either lower extremity. Electronically Signed   By: Lowella Grip III M.D.   On: 05/05/2015 10:33   Dg Chest Port 1 View  05/06/2015  CLINICAL DATA:  Shortness of breath. Leukocytosis. Recent abdominal surgery. EXAM: PORTABLE CHEST 1 VIEW COMPARISON:  04/04/2015. FINDINGS: Prior CABG. Heart size stable. Dense left lung diffuse infiltrate noted consistent with pneumonia. Prominent areas of bilateral subsegmental  atelectasis. Free intraperitoneal air, most likely from recent abdominal surgery . Scratched Clinical correlation is suggested. Abdominal series can be obtained for further evaluation. IMPRESSION: 1. Free intraperitoneal air, most likely from recent abdominal surgery. Clinical correlation suggested. Abdominal series can be obtained for further evaluation . 2. Diffuse dense left lung infiltrate consistent pneumonia. Multifocal bilateral subsegmental atelectasis . Critical Value/emergent results were called by telephone at the time of interpretation on 05/06/2015 at 8:41 am to nurse Anderson Malta, who verbally acknowledged these results. Electronically Signed   By: Shannon   On: 05/06/2015 08:43     CBC  Recent Labs Lab 05/03/15 0733 05/04/15 0432 05/05/15 0443 05/06/15 0440 05/07/15 0554  WBC 11.7* 9.2 10.8* 16.4* 11.7*  HGB 10.9* 10.6* 10.5* 10.1* 8.7*  HCT 33.1* 32.8* 32.4* 31.5* 27.1*  PLT 198 208 199 218 203  MCV 86.0 88.4 87.1 87.5 88.3  MCH 28.3 28.6 28.2 28.1 28.3  MCHC 32.9 32.3 32.4 32.1 32.1  RDW 16.5* 15.8* 15.6* 15.5 15.7*    Chemistries   Recent Labs Lab 05/03/15 0546 05/04/15 0432 05/05/15 0443 05/06/15 0440 05/07/15 0554  NA 134* 134* 131* 133* 136  K 4.3 4.2 4.2 4.6 4.2  CL 99* 101 101 102 106  CO2 27 25 23 23 24   GLUCOSE 120* 135* 124* 133* 131*  BUN 16 13 10 15 17   CREATININE 1.41* 1.31* 1.16 1.33* 1.28*  CALCIUM 8.8* 8.2* 8.0* 8.3* 7.8*  MG  --  1.3* 1.8 1.7  --    ------------------------------------------------------------------------------------------------------------------ estimated creatinine clearance is 37.9 mL/min (by C-G formula based on Cr of 1.28). ------------------------------------------------------------------------------------------------------------------ No results for input(s): HGBA1C in the last 72 hours. ------------------------------------------------------------------------------------------------------------------ No results  for input(s): CHOL, HDL, LDLCALC, TRIG, CHOLHDL, LDLDIRECT in the last 72 hours. ------------------------------------------------------------------------------------------------------------------ No results for input(s): TSH, T4TOTAL, T3FREE, THYROIDAB in the last 72 hours.  Invalid input(s): FREET3 ------------------------------------------------------------------------------------------------------------------ No results for input(s): VITAMINB12, FOLATE, FERRITIN, TIBC, IRON, RETICCTPCT in the last 72 hours.  Coagulation profile No results for input(s): INR, PROTIME in the last 168 hours.  No results for input(s): DDIMER in the last 72 hours.  Cardiac Enzymes No results for input(s): CKMB, TROPONINI, MYOGLOBIN in the last 168 hours.  Invalid input(s): CK ------------------------------------------------------------------------------------------------------------------ Invalid input(s): POCBNP     Time Spent in minutes   30 minutes   Dayanne Yiu M.D on 05/07/2015 at 9:48 AM  Between 7am to 7pm - Pager - [phone]  After 7pm go to [email or web address] - password [facility]  [facility]   Office  [phone]

## 2015-05-07 NOTE — Progress Notes (Signed)
4 Days Post-Op  Subjective: Patient resting comfortably.  Objective: Vital signs in last 24 hours: Temp:  [97.9 F (36.6 C)-98.4 F (36.9 C)] 98.3 F (36.8 C) (12/13 0533) Pulse Rate:  [59-115] 115 (12/12 2028) Resp:  [9-20] 18 (12/13 0533) BP: (92-132)/(47-68) 120/47 mmHg (12/13 0533) SpO2:  [85 %-99 %] 93 % (12/13 0533) Last BM Date: 05/02/15  Intake/Output from previous day: 12/12 0701 - 12/13 0700 In: 340 [P.O.:240; IV Piggyback:100] Out: 800 [Urine:800] Intake/Output this shift:    General appearance: appears stated age and no distress GI: Soft, occasional bowel sounds appreciated. Incision healing well. No distention noted.  Lab Results:   Recent Labs  05/06/15 0440 05/07/15 0554  WBC 16.4* 11.7*  HGB 10.1* 8.7*  HCT 31.5* 27.1*  PLT 218 203   BMET  Recent Labs  05/06/15 0440 05/07/15 0554  NA 133* 136  K 4.6 4.2  CL 102 106  CO2 23 24  GLUCOSE 133* 131*  BUN 15 17  CREATININE 1.33* 1.28*  CALCIUM 8.3* 7.8*   PT/INR No results for input(s): LABPROT, INR in the last 72 hours.  Studies/Results: US Venous Img Lower Bilateral  05/05/2015  CLINICAL DATA:  Bilateral lower extremity pain EXAM: BILATERAL LOWER EXTREMITY VENOUS DUPLEX ULTRASOUND TECHNIQUE: Gray-scale sonography with graded compression, as well as color Doppler and duplex ultrasound were performed to evaluate the lower extremity deep venous systems from the level of the common femoral vein and including the common femoral, femoral, profunda femoral, popliteal and calf veins including the posterior tibial, peroneal and gastrocnemius veins when visible. The superficial great saphenous vein was also interrogated. Spectral Doppler was utilized to evaluate flow at rest and with distal augmentation maneuvers in the common femoral, femoral and popliteal veins. COMPARISON:  None. FINDINGS: RIGHT LOWER EXTREMITY Common Femoral Vein: No evidence of thrombus. Normal compressibility, respiratory phasicity  and response to augmentation. Saphenofemoral Junction: No evidence of thrombus. Normal compressibility and flow on color Doppler imaging. Profunda Femoral Vein: No evidence of thrombus. Normal compressibility and flow on color Doppler imaging. Femoral Vein: No evidence of thrombus. Normal compressibility, respiratory phasicity and response to augmentation. Popliteal Vein: No evidence of thrombus. Normal compressibility, respiratory phasicity and response to augmentation. Calf Veins: No evidence of thrombus. Normal compressibility and flow on color Doppler imaging. Superficial Great Saphenous Vein: No evidence of thrombus. Normal compressibility and flow on color Doppler imaging. Venous Reflux:  None. Other Findings:  None. LEFT LOWER EXTREMITY Common Femoral Vein: No evidence of thrombus. Normal compressibility, respiratory phasicity and response to augmentation. Saphenofemoral Junction: No evidence of thrombus. Normal compressibility and flow on color Doppler imaging. Profunda Femoral Vein: No evidence of thrombus. Normal compressibility and flow on color Doppler imaging. Femoral Vein: No evidence of thrombus. Normal compressibility, respiratory phasicity and response to augmentation. Popliteal Vein: No evidence of thrombus. Normal compressibility, respiratory phasicity and response to augmentation. Calf Veins: No evidence of thrombus. Normal compressibility and flow on color Doppler imaging. Superficial Great Saphenous Vein: No evidence of thrombus. Normal compressibility and flow on color Doppler imaging. Venous Reflux:  None. Other Findings:  None. IMPRESSION: No evidence of deep venous thrombosis in either lower extremity. Electronically Signed   By: Lowella Grip III M.D.   On: 05/05/2015 10:33   Dg Chest Port 1 View  05/06/2015  CLINICAL DATA:  Shortness of breath. Leukocytosis. Recent abdominal surgery. EXAM: PORTABLE CHEST 1 VIEW COMPARISON:  04/04/2015. FINDINGS: Prior CABG. Heart size stable.  Dense left lung diffuse infiltrate noted consistent with  pneumonia. Prominent areas of bilateral subsegmental atelectasis. Free intraperitoneal air, most likely from recent abdominal surgery . Scratched Clinical correlation is suggested. Abdominal series can be obtained for further evaluation. IMPRESSION: 1. Free intraperitoneal air, most likely from recent abdominal surgery. Clinical correlation suggested. Abdominal series can be obtained for further evaluation . 2. Diffuse dense left lung infiltrate consistent pneumonia. Multifocal bilateral subsegmental atelectasis . Critical Value/emergent results were called by telephone at the time of interpretation on 05/06/2015 at 8:41 am to nurse Anderson Malta, who verbally acknowledged these results. Electronically Signed   By: Marcello Moores  Register   On: 05/06/2015 08:43    Anti-infectives: Anti-infectives    Start     Dose/Rate Route Frequency Ordered Stop   05/07/15 0900  vancomycin (VANCOCIN) IVPB 1000 mg/200 mL premix     1,000 mg 200 mL/hr over 60 Minutes Intravenous Every 24 hours 05/06/15 0925     05/06/15 0930  vancomycin (VANCOCIN) 1,250 mg in sodium chloride 0.9 % 250 mL IVPB     1,250 mg 166.7 mL/hr over 90 Minutes Intravenous  Once 05/06/15 0925 05/06/15 1152   05/06/15 0915  piperacillin-tazobactam (ZOSYN) IVPB 3.375 g     3.375 g 12.5 mL/hr over 240 Minutes Intravenous Every 8 hours 05/06/15 0908     05/06/15 0915  vancomycin (VANCOCIN) 1,250 mg in sodium chloride 0.9 % 250 mL IVPB  Status:  Discontinued     1,250 mg 166.7 mL/hr over 90 Minutes Intravenous Every 12 hours 05/06/15 0909 05/06/15 0925   05/03/15 1146  metroNIDAZOLE (FLAGYL) IVPB 500 mg     500 mg 100 mL/hr over 60 Minutes Intravenous On call to O.R. 05/03/15 1146 05/03/15 1224   05/03/15 1146  ciprofloxacin (CIPRO) IVPB 400 mg     400 mg 200 mL/hr over 60 Minutes Intravenous On call to O.R. 05/03/15 1146 05/03/15 1200      Assessment/Plan: s/p Procedure(s):  PARTIAL SMALL  BOWEL RESECTION Impression: Left pneumonia, being treated. Awaiting full return of bowel function. Patient may have some difficulty with swallowing. Speech pathology evaluated. May advance diet once bowel function returns and patient has a bowel movement. Final pathology pending.  LOS: 10 days    Reginold Beale A 05/07/2015

## 2015-05-08 ENCOUNTER — Inpatient Hospital Stay (HOSPITAL_COMMUNITY): Payer: Medicare Other

## 2015-05-08 DIAGNOSIS — J189 Pneumonia, unspecified organism: Secondary | ICD-10-CM

## 2015-05-08 LAB — BASIC METABOLIC PANEL
ANION GAP: 6 (ref 5–15)
BUN: 15 mg/dL (ref 6–20)
CALCIUM: 8.2 mg/dL — AB (ref 8.9–10.3)
CO2: 26 mmol/L (ref 22–32)
Chloride: 106 mmol/L (ref 101–111)
Creatinine, Ser: 1.25 mg/dL — ABNORMAL HIGH (ref 0.61–1.24)
GFR calc Af Amer: >60 mL/min (ref >60)
GFR, EST NON AFRICAN AMERICAN: 52 mL/min — AB (ref >60)
GLUCOSE: 112 mg/dL — AB (ref 65–99)
Potassium: 4.1 mmol/L (ref 3.5–5.1)
Sodium: 138 mmol/L (ref 135–145)

## 2015-05-08 LAB — CBC
HEMATOCRIT: 28.8 % — AB (ref 39.0–52.0)
Hemoglobin: 9.2 g/dL — ABNORMAL LOW (ref 13.0–17.0)
MCH: 28.3 pg (ref 26.0–34.0)
MCHC: 31.9 g/dL (ref 30.0–36.0)
MCV: 88.6 fL (ref 78.0–100.0)
PLATELETS: 210 10*3/uL (ref 150–400)
RBC: 3.25 MIL/uL — ABNORMAL LOW (ref 4.22–5.81)
RDW: 15.6 % — AB (ref 11.5–15.5)
WBC: 8.2 10*3/uL (ref 4.0–10.5)

## 2015-05-08 LAB — GLUCOSE, CAPILLARY
GLUCOSE-CAPILLARY: 131 mg/dL — AB (ref 65–99)
Glucose-Capillary: 103 mg/dL — ABNORMAL HIGH (ref 65–99)
Glucose-Capillary: 154 mg/dL — ABNORMAL HIGH (ref 65–99)
Glucose-Capillary: 136 mg/dL — ABNORMAL HIGH (ref 65–99)

## 2015-05-08 MED ORDER — PRO-STAT SUGAR FREE PO LIQD
30.0000 mL | Freq: Two times a day (BID) | ORAL | Status: DC
  Administered 2015-05-09 – 2015-05-12 (×6): 30 mL via ORAL
  Filled 2015-05-08 (×8): qty 30

## 2015-05-08 MED ORDER — ENOXAPARIN SODIUM 40 MG/0.4ML ~~LOC~~ SOLN
40.0000 mg | SUBCUTANEOUS | Status: DC
  Administered 2015-05-09 – 2015-05-12 (×4): 40 mg via SUBCUTANEOUS
  Filled 2015-05-08 (×4): qty 0.4

## 2015-05-08 NOTE — Progress Notes (Signed)
[facility] PROGRESS NOTE  [name] [name] [id number] DOB: 12-07-1930 DOA: 04/27/2015 PCP: [name], MD  Assessment/Plan: 1. Distal small bowel mass revealed on CT A/P. General surgery performed ExLap 12/9 with partial small bowel resection. 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 Will advance diet to puree with nectar-thick liquids. Further management per surgery. Final pathology pending. 2. Symptomatic blood loss anemia, secondary to small bowel mass. Stable s/p transfusion of 4U PRBCs. Will monitor closely.  3. HCAP. CXR revealed dense left lung opacity. Will continue IV abx, anticipate transitioning to oral abx tomorrow if remains stable. ST following, will advance diet to nectar thick liquids. WBC wnl, afebrile. 4. Sepsis secondary to #3. WBC wnl, Afebrile. BP stable. Continue abx 5. Sinus tachycardia, resolved. Bilateral US negative for DVT.  6. DM type 2, stable. Continue SSI. 7. BPH, stable. Continue Flomax. 8. Essential HTN, stable. Continue Metoprolol and Hydralazine as needed. 9. Hyponatremia, repleted. 10. Hypokalemia with hypomagnesemia, repleted.  11. Anorexia, continue supplemental and diet per ST.  12. CKD stage III, appears to be at baseline.   Code Status: Full DVT prophylaxis: Lovenox Family Communication: No family at bedside. Disposition Plan: Discharge when improved.   Consultants:  Surgery  GI  Cardiology  Procedures:  Transfusion 4U PRBCs.   ExLap 12/9 with partial small bowel resection  Antibiotics:  Cipro 12/9>>12/9  Flagy 12/9>>12/9  Zosyn  12/12>>  Vancomycin 12/12>>  HPI/Subjective: Patient is sleeping. Arouses to verbal stimuli but does not answer questions.  Per sitter, he ambulated to the bathroom earlier today and ate breakfast.   Objective: Filed Vitals:   05/07/15 2123 05/08/15 0500  BP: 126/54 134/54  Pulse: 104 57  Temp: 99.2 F (37.3 C) 98.4 F (36.9 C)  Resp: 20      Intake/Output Summary (Last 24 hours) at 05/08/15 0815 Last data filed at 05/07/15 2321  Gross per 24 hour  Intake 2859.17 ml  Output    725 ml  Net 2134.17 ml   Filed Weights   05/04/15 0400 05/05/15 0500 05/06/15 0500  Weight: 57 kg (125 lb 10.6 oz) 59.3 kg (130 lb 11.7 oz) 60.3 kg (132 lb 15 oz)    Exam:  General: NAD, looks comfortable Cardiovascular: RRR, S1, S2  Respiratory: Crackles on left side, No wheezing  Abdomen: soft, mild TTP on right side, no distention , bowel sounds normal Musculoskeletal: No edema b/l   Data Reviewed: Basic Metabolic Panel:  Recent Labs Lab 05/04/15 0432 05/05/15 0443 05/06/15 0440 05/07/15 0554 05/08/15 0602  NA 134* 131* 133* 136 138  K 4.2 4.2 4.6 4.2 4.1  CL 101 101 102 106 106  CO2 25 23 23 24 26   GLUCOSE 135* 124* 133* 131* 112*  BUN 13 10 15 17 15   CREATININE 1.31* 1.16 1.33* 1.28* 1.25*  CALCIUM 8.2* 8.0* 8.3* 7.8* 8.2*  MG 1.3* 1.8 1.7  --   --   PHOS 3.7 2.4* 2.8  --   --     CBC:  Recent Labs Lab 05/04/15 0432 05/05/15 0443 05/06/15 0440 05/07/15 0554 05/08/15 0602  WBC 9.2 10.8* 16.4* 11.7* 8.2  HGB 10.6* 10.5* 10.1* 8.7* 9.2*  HCT 32.8* 32.4* 31.5* 27.1* 28.8*  MCV 88.4 87.1 87.5 88.3 88.6  PLT 208 199 218 203 210    BNP (last 3 results)  Recent Labs  02/22/15 1446  BNP 84.0     CBG:  Recent Labs Lab 05/07/15 0739 05/07/15 1151 05/07/15 1635 05/07/15 2018 05/08/15 0749  GLUCAP 112* 163* 111* 96 103*    Recent Results (from the past 240 hour(s))  MRSA PCR Screening     Status: None   Collection Time: 05/02/15 11:45 AM  Result Value Ref Range Status   MRSA by PCR NEGATIVE NEGATIVE Final    Comment:        The GeneXpert MRSA Assay (FDA approved for NASAL specimens only), is one component of a comprehensive MRSA colonization surveillance program. It is not intended to diagnose MRSA infection nor to guide or monitor treatment for MRSA infections.      Studies: Dg Chest  Port 1 View  05/06/2015  CLINICAL DATA:  Shortness of breath. Leukocytosis. Recent abdominal surgery. EXAM: PORTABLE CHEST 1 VIEW COMPARISON:  04/04/2015. FINDINGS: Prior CABG. Heart size stable. Dense left lung diffuse infiltrate noted consistent with pneumonia. Prominent areas of bilateral subsegmental atelectasis. Free intraperitoneal air, most likely from recent abdominal surgery . Scratched Clinical correlation is suggested. Abdominal series can be obtained for further evaluation. IMPRESSION: 1. Free intraperitoneal air, most likely from recent abdominal surgery. Clinical correlation suggested. Abdominal series can be obtained for further evaluation . 2. Diffuse dense left lung infiltrate consistent pneumonia. Multifocal bilateral subsegmental atelectasis . Critical Value/emergent results were called by telephone at the time of interpretation on 05/06/2015 at 8:41 am to nurse Anderson Malta, who verbally acknowledged these results. Electronically Signed   By: [place]   On: 05/06/2015 08:43    Scheduled Meds: . antiseptic oral rinse  7 mL Mouth Rinse BID  . clonazePAM  1 mg Oral QHS  . enoxaparin (LOVENOX) injection  30 mg Subcutaneous Q24H  . feeding supplement (ENSURE ENLIVE)  237 mL Oral BID BM  . guaiFENesin  1,200 mg Oral BID  . Influenza vac split quadrivalent PF  0.5 mL Intramuscular Tomorrow-1000  . insulin aspart  0-15 Units Subcutaneous TID WC  . metoprolol tartrate  12.5 mg Oral BID  . mirtazapine  15 mg Oral QHS  . pantoprazole  40 mg Oral Daily  . piperacillin-tazobactam (ZOSYN)  IV  3.375 g Intravenous Q8H  . pneumococcal 23 valent vaccine  0.5 mL Intramuscular Tomorrow-1000  . sodium chloride  3 mL Intravenous Q12H  . tamsulosin  0.4 mg Oral Daily  . vancomycin  1,000 mg Intravenous Q24H   Continuous Infusions: . sodium chloride 50 mL/hr at 05/08/15 0210    Active Problems:   Diabetes mellitus, type 2 ([place])   Essential hypertension, benign   Symptomatic anemia    Acute renal failure superimposed on stage 3 chronic kidney disease (HCC)   Dehydration   Fecal impaction in rectum (HCC)   Constipation   Falls   Lumbar compression fracture (HCC)   Hyperkalemia   Hyponatremia   BPH (benign prostatic hypertrophy)   Protein-calorie malnutrition, severe   Small bowel mass   Unilateral recurrent inguinal hernia without obstruction or gangrene   Abnormality of rectum    Time spent: 20 minutes  Abbott Jasinski. MD [facility] Pager [phone]. If 7PM-7AM, please contact night-coverage at [email or web address], password [facility] 05/08/2015, 8:15 AM  LOS: 11 days      By signing my name below, I, Rosalie Doctor, attest that this documentation has been prepared under the direction and in the presence of [employer]. MD Electronically Signed: Rosalie Doctor, Scribe. 05/08/2015 11:04am   I, Dr. Kathie Dike, personally performed the services described in this documentaiton. All medical record entries made by the scribe were at my direction and in my presence. I have  reviewed the chart and agree that the record reflects my personal performance and is accurate and complete  Kathie Dike, MD, 05/08/2015 11:28 AM

## 2015-05-08 NOTE — Progress Notes (Signed)
Nutrition Follow-up   INTERVENTION:  Ensure Enlive po BID, each supplement provides 350 kcal and 20 grams of protein   Add Prostat 30 ml BID  NUTRITION DIAGNOSIS:   Inadequate oral intake related to poor appetite as evidenced by per patient/family report.   GOAL:   Patient will meet greater than or equal to 90% of their needs   MONITOR:   PO intake, Supplement acceptance  REASON FOR ASSESSMENT:   Malnutrition Screening Tool    ASSESSMENT: Pt has hx of DM type 2, CKD stage III and anemia.He is s/p partial small bowel resection on 12/9. He is being referred to oncology for follow up.   His diet has been advanced per ST but intake remains very poor (0-10%). He had a MBSS this afternoon results are pending. His lunch tray is here and untouched. His weight has increased 4.5 kg since initial assessment.   Labs: Cr 1.25, glucose- 154,  H/H-9.2//28.8  Diet Order:  DIET - DYS 1 Room service appropriate?: Yes; Fluid consistency:: Nectar Thick  Skin:   intact  Last BM:   12/8  Height:   Ht Readings from Last 1 Encounters:  05/03/15 5\' 5"  (1.651 m)    Weight:   Wt Readings from Last 1 Encounters:  05/06/15 132 lb 15 oz (60.3 kg)  admit weight 122.9#   Ideal Body Weight:  61.8 kg  BMI:  Body mass index is 22.12 kg/(m^2).  Re-estimated Nutritional Needs:   Kcal: 1800-2000 (to prevent weight loss) Protein:  70-78 gr Fluid: 1.8 liters daily  EDUCATION NEEDS:   Education needs addressed  Colman Cater MS,RD,CSG,LDN Office: [phone] Pager: [phone]

## 2015-05-08 NOTE — Progress Notes (Signed)
Speech Pathology  MBSS scheduled for 1:45 PM this afternoon. Pt may continue diet as ordered. Results to follow.  Thank you,  Genene Churn, CCC-SLP [phone]

## 2015-05-08 NOTE — Progress Notes (Signed)
5 Days Post-Op  Subjective: Patient resting comfortably. Apparently his bowel function has returned.  Objective: Vital signs in last 24 hours: Temp:  [97.9 F (36.6 C)-99.2 F (37.3 C)] 98.4 F (36.9 C) (12/14 0500) Pulse Rate:  [57-104] 57 (12/14 0500) Resp:  [20-22] 20 (12/13 2123) BP: (112-134)/(49-54) 134/54 mmHg (12/14 0500) SpO2:  [90 %-97 %] 96 % (12/14 0500) Last BM Date: 05/02/15  Intake/Output from previous day: 12/13 0701 - 12/14 0700 In: 2859.2 [P.O.:360; I.V.:2499.2] Out: 725 [Urine:725] Intake/Output this shift: Total I/O In: 360 [P.O.:360] Out: -   GI: Soft, flat. Active bowel sounds. Incision healing well.  Lab Results:   Recent Labs  05/07/15 0554 05/08/15 0602  WBC 11.7* 8.2  HGB 8.7* 9.2*  HCT 27.1* 28.8*  PLT 203 210   BMET  Recent Labs  05/07/15 0554 05/08/15 0602  NA 136 138  K 4.2 4.1  CL 106 106  CO2 24 26  GLUCOSE 131* 112*  BUN 17 15  CREATININE 1.28* 1.25*  CALCIUM 7.8* 8.2*   PT/INR No results for input(s): LABPROT, INR in the last 72 hours.  Studies/Results: No results found.  Anti-infectives: Anti-infectives    Start     Dose/Rate Route Frequency Ordered Stop   05/07/15 0900  vancomycin (VANCOCIN) IVPB 1000 mg/200 mL premix     1,000 mg 200 mL/hr over 60 Minutes Intravenous Every 24 hours 05/06/15 0925     05/06/15 0930  vancomycin (VANCOCIN) 1,250 mg in sodium chloride 0.9 % 250 mL IVPB     1,250 mg 166.7 mL/hr over 90 Minutes Intravenous  Once 05/06/15 0925 05/06/15 1152   05/06/15 0915  piperacillin-tazobactam (ZOSYN) IVPB 3.375 g     3.375 g 12.5 mL/hr over 240 Minutes Intravenous Every 8 hours 05/06/15 0908     05/06/15 0915  vancomycin (VANCOCIN) 1,250 mg in sodium chloride 0.9 % 250 mL IVPB  Status:  Discontinued     1,250 mg 166.7 mL/hr over 90 Minutes Intravenous Every 12 hours 05/06/15 0909 05/06/15 0925   05/03/15 1146  metroNIDAZOLE (FLAGYL) IVPB 500 mg     500 mg 100 mL/hr over 60 Minutes  Intravenous On call to O.R. 05/03/15 1146 05/03/15 1224   05/03/15 1146  ciprofloxacin (CIPRO) IVPB 400 mg     400 mg 200 mL/hr over 60 Minutes Intravenous On call to O.R. 05/03/15 1146 05/03/15 1200      Assessment/Plan: s/p Procedure(s):  PARTIAL SMALL BOWEL RESECTION Impression: Bowel function has returned. Patient has been advanced to. Diet as per speech pathology. 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 Further tests are pending. I have talked with oncology and told him that I will refer the patient as an outpatient. Thus, I have cancel the consultation.  LOS: 11 days    [name] [name] 05/08/2015

## 2015-05-09 ENCOUNTER — Inpatient Hospital Stay (HOSPITAL_COMMUNITY): Payer: Medicare Other

## 2015-05-09 DIAGNOSIS — W19XXXD Unspecified fall, subsequent encounter: Secondary | ICD-10-CM

## 2015-05-09 DIAGNOSIS — Z515 Encounter for palliative care: Secondary | ICD-10-CM

## 2015-05-09 LAB — GLUCOSE, CAPILLARY
GLUCOSE-CAPILLARY: 108 mg/dL — AB (ref 65–99)
GLUCOSE-CAPILLARY: 140 mg/dL — AB (ref 65–99)
Glucose-Capillary: 126 mg/dL — ABNORMAL HIGH (ref 65–99)
Glucose-Capillary: 157 mg/dL — ABNORMAL HIGH (ref 65–99)

## 2015-05-09 MED ORDER — IOHEXOL 300 MG/ML  SOLN
75.0000 mL | Freq: Once | INTRAMUSCULAR | Status: AC | PRN
  Administered 2015-05-09: 75 mL via INTRAVENOUS

## 2015-05-09 MED ORDER — AMOXICILLIN-POT CLAVULANATE 875-125 MG PO TABS
1.0000 | ORAL_TABLET | Freq: Two times a day (BID) | ORAL | Status: DC
  Administered 2015-05-09 – 2015-05-12 (×6): 1 via ORAL
  Filled 2015-05-09 (×6): qty 1

## 2015-05-09 NOTE — Care Management Note (Signed)
Case Management Note  Patient Details  Name: [name] [name] MRN: [id number] Date of Birth: 07-Apr-1931  Subjective/Objective:                    Action/Plan:   Expected Discharge Date:  04/29/15               Expected Discharge Plan:  [place]  In-House Referral:  NA  Discharge planning Services  CM Consult  Post Acute Care Choice:  Home Health Choice offered to:  Patient  DME Arranged:    DME Agency:     HH Arranged:  RN, PT [place] Agency:  [place]  Status of Service:  Completed, signed off  Medicare Important Message Given:  Yes Date Medicare IM Given:    Medicare IM give by:    Date Additional Medicare IM Given:    Additional Medicare Important Message give by:     If discussed at [place] of Stay Meetings, dates discussed:  05/09/15  Additional Comments:  Joylene Draft, RN 05/09/2015, 3:52 PM

## 2015-05-09 NOTE — Clinical Social Work Placement (Signed)
   CLINICAL SOCIAL WORK PLACEMENT  NOTE  Date:  05/09/2015  Patient Details  Name: [name] [name] MRN: [id number] Date of Birth: 02/23/1931  Clinical Social Work is seeking post-discharge placement for this patient at the [place] level of care (*CSW will initial, date and re-position this form in  chart as items are completed):  Yes   Patient/family provided with [place] Work Department's list of facilities offering this level of care within the geographic area requested by the patient (or if unable, by the patient's family).  Yes   Patient/family informed of their freedom to choose among providers that offer the needed level of care, that participate in Medicare, Medicaid or managed care program needed by the patient, have an available bed and are willing to accept the patient.  Yes   Patient/family informed of [place]'s ownership interest in [facility] and [facility], as well as of the fact that they are under no obligation to receive care at these facilities.  PASRR submitted to EDS on 05/09/15     PASRR number received on 05/09/15     Existing PASRR number confirmed on       FL2 transmitted to all facilities in geographic area requested by pt/family on 05/09/15     FL2 transmitted to all facilities within larger geographic area on       Patient informed that his/her managed care company has contracts with or will negotiate with certain facilities, including the following:            Patient/family informed of bed offers received.  Patient chooses bed at       Physician recommends and patient chooses bed at      Patient to be transferred to   on  .  Patient to be transferred to facility by       Patient family notified on   of transfer.  Name of family member notified:        PHYSICIAN       Additional Comment:    _______________________________________________ Salome Arnt, [place] 05/09/2015, 4:10  PM [phone]

## 2015-05-09 NOTE — Plan of Care (Signed)
Family meeting with wife, Rod Holler, and two daughters Rosanne Ashing, (Ruth's daughter), and Thayer Headings (lives in [place] not Ruth's daughter. 51 years older) 12/16 around mid day.

## 2015-05-09 NOTE — Progress Notes (Signed)
Discussed pathology results with the pathologist. This is poorly differentiated, though a lung primary needs to be ruled out. Would suggest CT scan of chest once patient is able to tolerate lying flat.

## 2015-05-09 NOTE — Evaluation (Signed)
Physical Therapy Evaluation Patient Details Name: [name] [name] MRN: [id number] DOB: 1930-06-03 Today's Date: 05/09/2015   History of Present Illness  81 year old male with a hx of anemia, DM type 2, HTN, CAD, HLD, and CKD stage III that presents with chronic anemia with occult GI bleeding. Transfused 2 units PRBC,Colonoscopy was performed which revealed an extrinsic mass pushing on the colon. A CT scan reveals a possible small bowel neoplasm or inflammatory mass in the distal small bowel in the pelvis, patient had  partial small bowel resection on 12/9 by Dr. Arnoldo Morale, recovering well postop, unfortunately patient developd HCAP, started on vac and Zosyn 12/12.  Clinical Impression   Pt was seen for eval.  He was alert and cooperative, daughter present.  He is currently recovering from bowel resection and has a midline abdominal incision which appears to be healing well.  He is found to have significant deconditioning due to this surgery and now needs assist with all transfers and was only able to ambulate 10' to and from the bathroom with a walker.  I am recommending SNF at d/c.  Pt normally is independent at home and cares for his wife.  Their daughter has recently returned to work and is unable to assist at home.  They have a neighbor looking in on his wife.  Apparently, his wife is very dependent on him when he is at home.    Follow Up Recommendations SNF    Equipment Recommendations  None recommended by PT    Recommendations for Other Services   OT    Precautions / Restrictions Precautions Precautions: Fall Restrictions Weight Bearing Restrictions: No      Mobility  Bed Mobility Overal bed mobility: Needs Assistance Bed Mobility: Sidelying to Sit   Sidelying to sit: Min assist       General bed mobility comments: pt instructed in log rolling in order to protect abdominal incision  Transfers Overall transfer level: Needs assistance Equipment used: Rolling walker (2  wheeled) Transfers: Sit to/from Stand Sit to Stand: Min assist            Ambulation/Gait Ambulation/Gait assistance: Supervision Ambulation Distance (Feet): 10 Feet (x 2) Assistive device: Rolling walker (2 wheeled) Gait Pattern/deviations: Trunk flexed;Decreased stride length   Gait velocity interpretation: <1.8 ft/sec, indicative of risk for recurrent falls    Stairs            Wheelchair Mobility    Modified Rankin (Stroke Patients Only)       Balance Overall balance assessment: History of Falls;Needs assistance Sitting-balance support: No upper extremity supported;Feet supported Sitting balance-Leahy Scale: Good     Standing balance support: No upper extremity supported Standing balance-Leahy Scale: Fair                               Pertinent Vitals/Pain Pain Assessment: No/denies pain    Home Living Family/patient expects to be discharged to:: Skilled nursing facility Living Arrangements: Spouse/significant other Available Help at Discharge: Family;Available 24 hours/day Type of Home: House Home Access: Stairs to enter Entrance Stairs-Rails: Right Entrance Stairs-Number of Steps: 4 Home Layout: One level Home Equipment: Walker - 2 wheels;Cane - single point      Prior Function Level of Independence: Independent with assistive device(s)         Comments: prior to last hospital admission     Hand Dominance   Dominant Hand: Right    Extremity/Trunk Assessment  Upper Extremity Assessment: Generalized weakness           Lower Extremity Assessment: Generalized weakness      Cervical / Trunk Assessment: Kyphotic  Communication   Communication: HOH  Cognition Arousal/Alertness: Awake/alert Behavior During Therapy: WFL for tasks assessed/performed Overall Cognitive Status: Impaired/Different from baseline Area of Impairment: Orientation Orientation Level: Disoriented to;Place;Situation;Time                   General Comments      Exercises General Exercises - Lower Extremity Ankle Circles/Pumps: AROM;Both;10 reps;Supine Quad Sets: AROM;Both;10 reps;Supine Gluteal Sets: Supine;AROM;Both;10 reps Heel Slides: AROM;Both;10 reps;Supine Hip ABduction/ADduction: AROM;Both;10 reps;Supine Straight Leg Raises: AROM;Both;5 reps;Supine      Assessment/Plan    PT Assessment Patient needs continued PT services  PT Diagnosis Generalized weakness;Difficulty walking   PT Problem List Decreased strength;Decreased activity tolerance;Decreased mobility;Decreased cognition;Decreased knowledge of precautions  PT Treatment Interventions Gait training;Functional mobility training;Therapeutic exercise   PT Goals (Current goals can be found in the Care Plan section) Acute Rehab PT Goals Patient Stated Goal: none stated PT Goal Formulation: With patient/family Time For Goal Achievement: 05/23/15 Potential to Achieve Goals: Good    Frequency Min 3X/week   Barriers to discharge Decreased caregiver support pt normally cares for his wife and daughter is at work daytime...a neighbor is currently checking in on wife    Co-evaluation               End of Session Equipment Utilized During Treatment: Gait belt Activity Tolerance: Patient tolerated treatment well Patient left: in chair;with call bell/phone within reach;with family/visitor present           Time: 1445-1517 PT Time Calculation (min) (ACUTE ONLY): 32 min   Charges:   PT Evaluation $Initial PT Evaluation Tier I: 1 Procedure PT Treatments $Therapeutic Exercise: 8-22 mins   PT G CodesSable Feil  PT 05/09/2015, 3:25 PM [phone]

## 2015-05-09 NOTE — Clinical Social Work Note (Signed)
Clinical Social Work Assessment  Patient Details  Name: MAVRIK BYNUM MRN: [id number] Date of Birth: [birth year]  Date of referral:  05/09/15               Reason for consult:  Discharge Planning                Permission sought to share information with:    Permission granted to share information::     Name::        Agency::     Relationship::     Contact Information:     Housing/Transportation Living arrangements for the past 2 months:  Single Family Home Source of Information:  Adult Children Patient Interpreter Needed:  None Criminal Activity/Legal Involvement Pertinent to Current Situation/Hospitalization:  No - Comment as needed Significant Relationships:  Spouse, Adult Children, Neighbor Lives with:  Spouse Do you feel safe going back to the place where you live?  No Need for family participation in patient care:  Yes (Comment)  Care giving concerns:  Pt unable to manage at home in current condition.    Social Worker assessment / plan:  CSW met with pt's daughters, Rosanne Ashing and Thayer Headings at bedside. Pt alert, but oriented to self only. Rosanne Ashing indicates that she is HCPOA. Pt lives with his wife and generally manages okay. They have a caregiver/neighbor who helps out a lot at home. Rosanne Ashing shared that pt is normally oriented x4, but has been very confused since being in hospital. He is post op day 6 and has had extended hospital stay. As a result, pt is very deconditioned. CSW discussed PT recommendation for SNF. Both daughters are in agreement that pt will need short term SNF as their mother cannot meet his care needs right now. They are aware of Medicare coverage/criteria and request [place] only at this point and prefer a private room. At baseline, pt ambulates with a walker. He was taking daily walks independently until about 2 months ago. SNF list provided.   Employment status:  Retired Forensic scientist:  Medicare PT Recommendations:  [place] /  Referral to community resources:  [place]  Patient/Family's Response to care:  Pt's daughter/HCPOA agreeable to short term SNF.   Patient/Family's Understanding of and Emotional Response to Diagnosis, Current Treatment, and Prognosis:  Pt's family appears to be well aware of health history. His daughter became emotional as SNF was discussed because she feels her dad will have a difficult adjustment. However, she acknowledges that they cannot manage at home. Support provided.   Emotional Assessment Appearance:  Appears stated age Attitude/Demeanor/Rapport:  Unable to Assess Affect (typically observed):  Unable to Assess Orientation:  Oriented to Self Alcohol / Substance use:  Not Applicable Psych involvement (Current and /or in the community):  No (Comment)  Discharge Needs  Concerns to be addressed:  Discharge Planning Concerns Readmission within the last 30 days:  No Current discharge risk:  Physical Impairment Barriers to Discharge:  Continued Medical Work up   Salome Arnt, [place] 05/09/2015, 4:13 PM [phone]

## 2015-05-09 NOTE — NC FL2 (Signed)
[place] LEVEL OF CARE SCREENING TOOL     IDENTIFICATION  Patient Name: [name] [name] Birthdate: February 08, 1931 Sex: male Admission Date (Current Location): 04/27/2015  [facility] and [place] Number:     Facility and Address:  [facility] [address], [place]      Provider Number: [id number]  Attending Physician Name and Address:  Kathie Dike, MD  Relative Name and Phone Number:       Current Level of Care: Hospital Recommended Level of Care: [facility] [place] Prior Approval Number:    Date Approved/Denied:   PASRR Number: [id number]  Discharge Plan: SNF    Current Diagnoses: Patient Active Problem List   Diagnosis Date Noted  . Small bowel mass   . Unilateral recurrent inguinal hernia without obstruction or gangrene   . Abnormality of rectum   . Protein-calorie malnutrition, severe 04/30/2015  . Acute renal failure superimposed on stage 3 chronic kidney disease ([place]) 04/27/2015  . Dehydration 04/27/2015  . Fecal impaction in rectum ([place]) 04/27/2015  . Constipation 04/27/2015  . Falls 04/27/2015  . Lumbar compression fracture ([place]) 04/27/2015  . Hyperkalemia 04/27/2015  . Hyponatremia 04/27/2015  . BPH (benign prostatic hypertrophy) 04/27/2015  . Iron deficiency anemia 04/05/2015  . Occult GI bleeding   . Symptomatic anemia 02/22/2015  . Chronic kidney disease, stage III (moderate) 02/22/2015  . Fecal occult blood test positive 02/22/2015  . Acute on chronic renal insufficiency ([place]) 01/09/2014  . Leg pain, bilateral 01/09/2014  . Anemia 09/07/2011  . Diabetes mellitus, type 2 ([place])   . Essential hypertension, benign   . Coronary atherosclerosis of native coronary artery   . Hyperlipidemia   . TIA (transient ischemic attack)   . Tobacco abuse   . Dyspnea 08/19/2011    Orientation RESPIRATION BLADDER Height & Weight    Self  Normal Continent 5\' 5"  (165.1 cm) 132 lbs.  BEHAVIORAL SYMPTOMS/MOOD  NEUROLOGICAL BOWEL NUTRITION STATUS  Other (Comment) (n/a)  (n/a) Continent Diet (Dysphagia 1 with nectar thick liquids)  AMBULATORY STATUS COMMUNICATION OF NEEDS Skin   Limited Assist Verbally Bruising, Surgical wounds                       Personal Care Assistance Level of Assistance  Bathing, Feeding, Dressing Bathing Assistance: Limited assistance Feeding assistance: Limited assistance Dressing Assistance: Limited assistance     Functional Limitations Info  Sight, Hearing, Speech Sight Info: Impaired Hearing Info: Impaired Speech Info: Adequate    SPECIAL CARE FACTORS FREQUENCY  PT (By licensed PT)     PT Frequency: 5              Contractures      Additional Factors Info  Psychotropic, Insulin Sliding Scale Code Status Info: Full code Allergies Info: Ambien, Aspirin, Morphine and Related Psychotropic Info: Klonopin Insulin Sliding Scale Info: 3x day       Current Medications (05/09/2015):  This is the current hospital active medication list Current Facility-Administered Medications  Medication Dose Route Frequency Provider Last Rate Last Dose  . acetaminophen (TYLENOL) tablet 650 mg  650 mg Oral Q6H PRN Kathie Dike, MD       Or  . acetaminophen (TYLENOL) suppository 650 mg  650 mg Rectal Q6H PRN Kathie Dike, MD      . amoxicillin-clavulanate (AUGMENTIN) 875-125 MG per tablet 1 tablet  1 tablet Oral Q12H Kathie Dike, MD   1 tablet at 05/09/15 1400  . antiseptic oral rinse (  CPC / CETYLPYRIDINIUM CHLORIDE 0.05%) solution 7 mL  7 mL Mouth Rinse BID Albertine Patricia, MD   7 mL at 05/09/15 1000  . clonazePAM (KLONOPIN) tablet 1 mg  1 mg Oral QHS Aviva Signs, MD   1 mg at 05/08/15 2236  . enoxaparin (LOVENOX) injection 40 mg  40 mg Subcutaneous Q24H Kathie Dike, MD   40 mg at 05/09/15 1020  . feeding supplement (ENSURE ENLIVE) (ENSURE ENLIVE) liquid 237 mL  237 mL Oral BID BM Kathie Dike, MD   237 mL at 05/09/15 1018  . feeding supplement  (PRO-STAT SUGAR FREE 64) liquid 30 mL  30 mL Oral BID Jeneen Rinks, RD   30 mL at 05/09/15 1020  . guaiFENesin (MUCINEX) 12 hr tablet 1,200 mg  1,200 mg Oral BID Albertine Patricia, MD   1,200 mg at 05/09/15 1019  . haloperidol lactate (HALDOL) injection 1 mg  1 mg Intravenous Q6H PRN Albertine Patricia, MD      . hydrALAZINE (APRESOLINE) injection 5 mg  5 mg Intravenous Q6H PRN Albertine Patricia, MD   5 mg at 05/05/15 0414  . HYDROcodone-acetaminophen (NORCO/VICODIN) 5-325 MG per tablet 1-2 tablet  1-2 tablet Oral Q4H PRN Kathie Dike, MD   2 tablet at 05/06/15 1142  . Influenza vac split quadrivalent PF (FLUARIX) injection 0.5 mL  0.5 mL Intramuscular Tomorrow-1000 Kathie Dike, MD      . insulin aspart (novoLOG) injection 0-15 Units  0-15 Units Subcutaneous TID WC Kathie Dike, MD   2 Units at 05/09/15 1257  . metoprolol tartrate (LOPRESSOR) tablet 12.5 mg  12.5 mg Oral BID Albertine Patricia, MD   12.5 mg at 05/09/15 1021  . mirtazapine (REMERON) tablet 15 mg  15 mg Oral QHS Kathie Dike, MD   15 mg at 05/08/15 2236  . ondansetron (ZOFRAN) tablet 4 mg  4 mg Oral Q6H PRN Kathie Dike, MD       Or  . ondansetron (ZOFRAN) injection 4 mg  4 mg Intravenous Q6H PRN Kathie Dike, MD   4 mg at 05/03/15 1944  . pantoprazole (PROTONIX) EC tablet 40 mg  40 mg Oral Daily Mahala Menghini, PA-C   40 mg at 05/09/15 1019  . pneumococcal 23 valent vaccine (PNU-IMMUNE) injection 0.5 mL  0.5 mL Intramuscular Tomorrow-1000 Kathie Dike, MD      . RESOURCE THICKENUP CLEAR   Oral PRN Albertine Patricia, MD      . simethicone (MYLICON) chewable tablet 40 mg  40 mg Oral Q6H PRN Aviva Signs, MD      . sodium chloride 0.9 % injection 3 mL  3 mL Intravenous Q12H Kathie Dike, MD   3 mL at 05/07/15 2118  . tamsulosin (FLOMAX) capsule 0.4 mg  0.4 mg Oral Daily Dionne Milo, NP   0.4 mg at 05/09/15 1019     Discharge Medications: Please see discharge summary for a list of discharge  medications.  Relevant Imaging Results:  Relevant Lab Results:   Additional Information    Salome Arnt, [place]

## 2015-05-09 NOTE — Care Management Note (Signed)
Case Management Note  Patient Details  Name: [name] [name] MRN: [id number] Date of Birth: 1930/11/11  Subjective/Objective:                    Action/Plan:   Expected Discharge Date:  04/29/15               Expected Discharge Plan:  [place]  In-House Referral:  NA  Discharge planning Services  CM Consult  Post Acute Care Choice:  Home Health Choice offered to:  Patient  DME Arranged:    DME Agency:     HH Arranged:  RN, PT [place] Agency:  [place]  Status of Service:  Completed, signed off  Medicare Important Message Given:  Yes Date Medicare IM Given:    Medicare IM give by:    Date Additional Medicare IM Given:    Additional Medicare Important Message give by:     If discussed at [place] of Stay Meetings, dates discussed:    Additional Comments: PT recommends SNF. CSW aware and will start bed search. Christinia Gully [place], RN 05/09/2015, 3:48 PM

## 2015-05-09 NOTE — Progress Notes (Signed)
[facility] PROGRESS NOTE  [name] [name] [id number] DOB: 1930/10/18 DOA: 04/27/2015 PCP: [name], MD  Assessment/Plan: 1. Distal small bowel mass revealed on CT A/P. General surgery performed ExLap 12/9 with partial small bowel resection. 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 Will order CT chest to r/o lung primary. He will follow up with oncology as an outpatient. Will advance diet per ST. Family has requested a palliative care consult to establish goals of care and to discuss expected progression of illness.  2. Symptomatic blood loss anemia, secondary to small bowel mass. Stable s/p transfusion of 4U PRBCs. No evidence of ongoing bleeding. Will monitor closely.  3. HCAP. CXR revealed dense left lung opacity. Will transition to oral abx today given he remains stable. ST following, diet per their recommendations. WBC wnl, afebrile. 4. Sepsis secondary to #3. WBC wnl, Afebrile. BP stable. Continue abx 5. Tachycardia, improved however heart sounds are irregular, will repeat EKG. Bilateral US negative for DVT.  6. DM type 2, stable. Continue SSI. 7. BPH, stable. Continue Flomax. 8. Essential HTN, stable. Continue Metoprolol and Hydralazine as needed. 9. Hyponatremia, repleted. 10. Hypokalemia with hypomagnesemia, repleted.  11. Anorexia, continue diet per ST.  12. CKD stage III, appears to be at baseline.   Code Status: Full DVT prophylaxis: Lovenox Family Communication: No family at bedside. Disposition Plan: Discharge when improved.   Consultants:  Surgery  GI  Cardiology  Procedures:  Transfusion 4U PRBCs.   ExLap 12/9 with partial small bowel resection  Antibiotics:  Cipro 12/9>>12/9  Flagy 12/9>>12/9  Zosyn  12/12>>12/15  Vancomycin 12/12>>12/15  Augmentin 12/15>>  HPI/Subjective: Breathing is okay, still has a cough and some abdominal pain. Has been having BMs   Objective: Filed Vitals:    05/08/15 2054 05/09/15 0504  BP: 139/69 150/69  Pulse: 117 114  Temp: 98 F (36.7 C) 97.6 F (36.4 C)  Resp:  16    Intake/Output Summary (Last 24 hours) at 05/09/15 0802 Last data filed at 05/09/15 0756  Gross per 24 hour  Intake    450 ml  Output   1325 ml  Net   -875 ml   Filed Weights   05/04/15 0400 05/05/15 0500 05/06/15 0500  Weight: 57 kg (125 lb 10.6 oz) 59.3 kg (130 lb 11.7 oz) 60.3 kg (132 lb 15 oz)    Exam:  General: NAD, looks comfortable Cardiovascular: Irregular, S1, S2  Respiratory: Coarse breath sounds bilaterally, No wheezing  Abdomen: soft,  Non tender, no distention , bowel sounds normal Musculoskeletal: No edema b/l   Data Reviewed: Basic Metabolic Panel:  Recent Labs Lab 05/04/15 0432 05/05/15 0443 05/06/15 0440 05/07/15 0554 05/08/15 0602  NA 134* 131* 133* 136 138  K 4.2 4.2 4.6 4.2 4.1  CL 101 101 102 106 106  CO2 25 23 23 24 26   GLUCOSE 135* 124* 133* 131* 112*  BUN 13 10 15 17 15   CREATININE 1.31* 1.16 1.33* 1.28* 1.25*  CALCIUM 8.2* 8.0* 8.3* 7.8* 8.2*  MG 1.3* 1.8 1.7  --   --   PHOS 3.7 2.4* 2.8  --   --     CBC:  Recent Labs Lab 05/04/15 0432 05/05/15 0443 05/06/15 0440 05/07/15 0554 05/08/15 0602  WBC 9.2 10.8* 16.4* 11.7* 8.2  HGB 10.6* 10.5* 10.1* 8.7* 9.2*  HCT 32.8* 32.4* 31.5* 27.1* 28.8*  MCV 88.4 87.1 87.5 88.3 88.6  PLT 208 199 218 203 210    BNP (last 3 results)  Recent Labs  02/22/15 1446  BNP 84.0     CBG:  Recent Labs Lab 05/08/15 0749 05/08/15 1119 05/08/15 1641 05/08/15 2051 05/09/15 0732  GLUCAP 103* 154* 131* 136* 108*    Recent Results (from the past 240 hour(s))  MRSA PCR Screening     Status: None   Collection Time: 05/02/15 11:45 AM  Result Value Ref Range Status   MRSA by PCR NEGATIVE NEGATIVE Final    Comment:        The GeneXpert MRSA Assay (FDA approved for NASAL specimens only), is one component of a comprehensive MRSA colonization surveillance program. It is  not intended to diagnose MRSA infection nor to guide or monitor treatment for MRSA infections.      Studies: No results found.  Scheduled Meds: . antiseptic oral rinse  7 mL Mouth Rinse BID  . clonazePAM  1 mg Oral QHS  . enoxaparin (LOVENOX) injection  40 mg Subcutaneous Q24H  . feeding supplement (ENSURE ENLIVE)  237 mL Oral BID BM  . feeding supplement (PRO-STAT SUGAR FREE 64)  30 mL Oral BID  . guaiFENesin  1,200 mg Oral BID  . Influenza vac split quadrivalent PF  0.5 mL Intramuscular Tomorrow-1000  . insulin aspart  0-15 Units Subcutaneous TID WC  . metoprolol tartrate  12.5 mg Oral BID  . mirtazapine  15 mg Oral QHS  . pantoprazole  40 mg Oral Daily  . piperacillin-tazobactam (ZOSYN)  IV  3.375 g Intravenous Q8H  . pneumococcal 23 valent vaccine  0.5 mL Intramuscular Tomorrow-1000  . sodium chloride  3 mL Intravenous Q12H  . tamsulosin  0.4 mg Oral Daily  . vancomycin  1,000 mg Intravenous Q24H   Continuous Infusions:    Active Problems:   Diabetes mellitus, type 2 ([place])   Essential hypertension, benign   Symptomatic anemia   Acute renal failure superimposed on stage 3 chronic kidney disease (HCC)   Dehydration   Fecal impaction in rectum (H[facility])   Constipation   Falls   Lumbar compression fracture (H[facility])   Hyperkalemia   Hyponatremia   BPH (benign prostatic hypertrophy)   Protein-calorie malnutrition, severe   Small bowel mass   Unilateral recurrent inguinal hernia without obstruction or gangrene   Abnormality of rectum    Time spent: 25 minutes  [name] [name]. MD [facility] Pager [phone]. If 7PM-7AM, please contact night-coverage at [email or web address], password [facility] 05/09/2015, 8:02 AM  LOS: 12 days     By signing my name below, I, [name] [name], attest that this documentation has been prepared under the direction and in the presence of [employer]. MD Electronically Signed: Rosalie [name], Scribe. 05/09/2015 1:12pm  I, Dr. Kathie Dike, personally performed the services described in this documentaiton. All medical record entries made by the scribe were at my direction and in my presence. I have reviewed the chart and agree that the record reflects my personal performance and is accurate and complete  Kathie Dike, MD, 05/09/2015 1:50 PM

## 2015-05-09 NOTE — Progress Notes (Signed)
Speech Language Pathology Treatment: Dysphagia  Patient Details Name: [name] [name] MRN: [id number] DOB: December 17, 1930 Today's Date: 05/09/2015 Time: [id number] SLP Time Calculation (min) (ACUTE ONLY): 33 min  Assessment / Plan / Recommendation Clinical Impression  [name] [name] was seen in room at bedside. He was much more alert, lights on, had just been to bathroom, and sitting upright in bed with friend in room. Pt agreeable to po trials for possible diet upgrade. Cued cough is much stronger today with only slight congestion noted. Pharyngeal swallow less audible compared to when seen on Tuesday during trials of thin water. Pt tolerated trials thin via cup sip (hand over hand guidance for self feeding). Mild wet vocal quality noted after ~90 ml. Pt cued to clear throat and repeat swallow which he did. Pt presented with graham cracker which he slowly self fed with mild lingual residuals noted. This cleared with liquid wash (nectars). Recommend upgrade to D3/mech soft and continue nectar-thick liquids given suspected delay in swallow initiation and mild wet vocal quality. Pt looks much better today than when previously seen given stronger cough and overall improved alertness. Pt agreeable to plan. Recommend SNF.    HPI HPI: [name] [name] is an 81 year old male with a hx of anemia, DM type 2, HTN, CAD, HLD, and CKD stage III that presents with chronic anemia with occult GI bleeding. Transfused 2 units PRBC,Colonoscopy was performed which revealed an extrinsic mass pushing on the colon. A CT scan reveals a possible small bowel neoplasm or inflammatory mass in the distal small bowel in the pelvis, patient had exploratory laparotomy with partial small bowel resection on 12/9 by Dr. Arnoldo Morale, recovering well postop, unfortunately patient developd HCAP, acted on vac and Zosyn 12/12. SLP asked to evaluate swallow.      SLP Plan  Continue with current plan of care     Recommendations  Diet recommendations:  Dysphagia 3 (mechanical soft);Nectar-thick liquid Liquids provided via: Cup Medication Administration: Whole meds with puree Supervision: Full supervision/cueing for compensatory strategies;Staff to assist with self feeding Compensations: Slow rate;Small sips/bites;Multiple dry swallows after each bite/sip;Clear throat intermittently;Hard cough after swallow;Effortful swallow Postural Changes and/or Swallow Maneuvers: Seated upright 90 degrees;Upright 30-60 min after meal              Oral Care Recommendations: Oral care BID;Oral care prior to ice chip/H20;Staff/trained caregiver to provide oral care Follow up Recommendations: Skilled Nursing facility Plan: Continue with current plan of care  Thank you,  Genene Churn, [place]  [place] 05/09/2015, 6:19 PM

## 2015-05-09 NOTE — Progress Notes (Signed)
SLP Cancellation Note  Patient Details Name: [name] [name] MRN: [id number] DOB: 06-29-1930   Cancelled treatment:       Reason Eval/Treat Not Completed: Patient at procedure or test/unavailable; SLP attempted to see patient this afternoon and he was sleeping soundly. I spoke with his daughter visiting from [place] who seems very realistic about her father and the impact of this hospitalization on his health. She expressed an interest in having palliative care involvement, which I relayed to Dr. Roderic Palau. SLP had wanted to complete objective test (MBSS) yesterday, however when transporter went to pick pt up, his daughter declined test. I returned now to assess pt for diet tolerance and trials of upgraded textures, however he is currently off the floor for testing. Will attempt to see pt as schedule permits.  Thank you,  Genene Churn, [place]    [place] 05/09/2015, 4:27 PM

## 2015-05-10 DIAGNOSIS — Z7189 Other specified counseling: Secondary | ICD-10-CM | POA: Insufficient documentation

## 2015-05-10 LAB — BASIC METABOLIC PANEL
ANION GAP: 5 (ref 5–15)
BUN: 13 mg/dL (ref 6–20)
CALCIUM: 8.4 mg/dL — AB (ref 8.9–10.3)
CHLORIDE: 101 mmol/L (ref 101–111)
CO2: 31 mmol/L (ref 22–32)
Creatinine, Ser: 1.2 mg/dL (ref 0.61–1.24)
GFR calc non Af Amer: 54 mL/min — ABNORMAL LOW (ref >60)
Glucose, Bld: 137 mg/dL — ABNORMAL HIGH (ref 65–99)
POTASSIUM: 3.8 mmol/L (ref 3.5–5.1)
Sodium: 137 mmol/L (ref 135–145)

## 2015-05-10 LAB — CBC
HEMATOCRIT: 27.7 % — AB (ref 39.0–52.0)
HEMOGLOBIN: 8.7 g/dL — AB (ref 13.0–17.0)
MCH: 27.5 pg (ref 26.0–34.0)
MCHC: 31.4 g/dL (ref 30.0–36.0)
MCV: 87.7 fL (ref 78.0–100.0)
Platelets: 243 10*3/uL (ref 150–400)
RBC: 3.16 MIL/uL — AB (ref 4.22–5.81)
RDW: 14.9 % (ref 11.5–15.5)
WBC: 6.5 10*3/uL (ref 4.0–10.5)

## 2015-05-10 LAB — GLUCOSE, CAPILLARY
GLUCOSE-CAPILLARY: 111 mg/dL — AB (ref 65–99)
GLUCOSE-CAPILLARY: 106 mg/dL — AB (ref 65–99)
Glucose-Capillary: 137 mg/dL — ABNORMAL HIGH (ref 65–99)

## 2015-05-10 MED ORDER — TRAMADOL HCL 50 MG PO TABS
50.0000 mg | ORAL_TABLET | Freq: Two times a day (BID) | ORAL | Status: DC
  Administered 2015-05-10 – 2015-05-12 (×5): 50 mg via ORAL
  Filled 2015-05-10 (×5): qty 1

## 2015-05-10 MED ORDER — LIDOCAINE 5 % EX PTCH
1.0000 | MEDICATED_PATCH | CUTANEOUS | Status: DC
  Administered 2015-05-10 – 2015-05-12 (×3): 1 via TRANSDERMAL
  Filled 2015-05-10 (×4): qty 1

## 2015-05-10 MED ORDER — METOPROLOL TARTRATE 25 MG PO TABS
25.0000 mg | ORAL_TABLET | Freq: Two times a day (BID) | ORAL | Status: DC
  Administered 2015-05-10 – 2015-05-12 (×4): 25 mg via ORAL
  Filled 2015-05-10 (×4): qty 1

## 2015-05-10 MED ORDER — DOCUSATE SODIUM 100 MG PO CAPS
100.0000 mg | ORAL_CAPSULE | Freq: Two times a day (BID) | ORAL | Status: DC
  Administered 2015-05-10 – 2015-05-12 (×4): 100 mg via ORAL
  Filled 2015-05-10 (×4): qty 1

## 2015-05-10 NOTE — Care Management Note (Signed)
Case Management Note  Patient Details  Name: [name] [name] MRN: [id number] Date of Birth: [birth year]  Subjective/Objective:                    Action/Plan:   Expected Discharge Date:  04/29/15               Expected Discharge Plan:  Skilled Nursing Facility  In-House Referral:  Clinical Social Work  Discharge planning Services  CM Consult  Post Acute Care Choice:  NA Choice offered to:  NA  DME Arranged:    DME Agency:     HH Arranged:    [place] Agency:     Status of Service:  Completed, signed off  Medicare Important Message Given:  Yes Date Medicare IM Given:    Medicare IM give by:    Date Additional Medicare IM Given:    Additional Medicare Important Message give by:     If discussed at [place] of Stay Meetings, dates discussed:    Additional Comments: CSW has arranged for bed at [facility]. Pt and pts daughters have meeting with Palliative care today. Anticipate discharge within 24 hours. Christinia Gully [place], RN 05/10/2015, 12:55 PM

## 2015-05-10 NOTE — Clinical Social Work Note (Signed)
CSW spoke with patient's daughter, Bebe Liter, and advised that [facility] had made a bed offer. Ms. Valda Favia accepted the bed offer. She requested that patient have a private room if possible.    CSW spoke with Tami at [facility] and was advised that patient would be in a private room.   CSW contacted Ms. [place] and confirmed that patient would be in a private room.  Ihor Gully, [place] [phone]

## 2015-05-10 NOTE — Clinical Social Work Placement (Signed)
   CLINICAL SOCIAL WORK PLACEMENT  NOTE  Date:  05/10/2015  Patient Details  Name: ESMAEL ZAPIEN MRN: [id number] Date of Birth: 1930/07/18  Clinical Social Work is seeking post-discharge placement for this patient at the [place] level of care (*CSW will initial, date and re-position this form in  chart as items are completed):  Yes   Patient/family provided with [place] Work Department's list of facilities offering this level of care within the geographic area requested by the patient (or if unable, by the patient's family).  Yes   Patient/family informed of their freedom to choose among providers that offer the needed level of care, that participate in Medicare, Medicaid or managed care program needed by the patient, have an available bed and are willing to accept the patient.  Yes   Patient/family informed of [facility]'s ownership interest in [facility] and [facility], as well as of the fact that they are under no obligation to receive care at these facilities.  PASRR submitted to EDS on 05/09/15     PASRR number received on 05/09/15     Existing PASRR number confirmed on       FL2 transmitted to all facilities in geographic area requested by pt/family on 05/09/15     FL2 transmitted to all facilities within larger geographic area on       Patient informed that his/her managed care company has contracts with or will negotiate with certain facilities, including the following:        Yes   Patient/family informed of bed offers received.  Patient chooses bed at [facility]     Physician recommends and patient chooses bed at      Patient to be transferred to [facility] on  .  Patient to be transferred to facility by       Patient family notified on   of transfer.  Name of family member notified:        PHYSICIAN       Additional Comment: Per Tami @ [facility], patient will have a private room which was preferred  by the family.    _______________________________________________ Ambrose Pancoast D, LCSW 05/10/2015, 1:09 PM

## 2015-05-10 NOTE — Consult Note (Signed)
Consultation Note Date: 05/10/2015   Patient Name: [name] [name]  DOB: [birth year]  MRN: [id number]  Age / Sex: 81 y.o., male   PCP: Celene Squibb, MD Referring Physician: Kathie Dike, MD  Reason for Consultation: Establishing goals of care and Psychosocial/spiritual support  Palliative Care Assessment and Plan Summary of Established Goals of Care and Medical Treatment Preferences   Clinical Assessment/Narrative: [name] [name] is sitting in his chair today.  Daughter Bebe Liter is at his side.  Wife Stanton Kidney, and daughter Townsend Roger arrive later. We talk about pain management, when asked about concerns, this is what [name] [name] speaks of.  We talk about the cancer diagnosis and following up with Dr. Whitney Muse in the cancer center. We talk about rehab and working with CM/SW there for d/c planning.  We talk about code status, and [name] [name] has elected to allow a natural death.  We discuss that this does not mean no treatments.   Rosanne Ashing is tearful at times, but talks about keeping her father at the center of all decisions.  We talk about going home after rehab, and [facility] vs Hospice services.     Contacts/Participants in Discussion: Primary Decision Maker: [name] [name] is able to make some basic decisions, but his daughter Rosanne Ashing is main Media planner. HCPOA: yes  Townsend Roger, daughter/HCPOA  Code Status/Advance Care Planning:  DNR  Allow a natural death.   We discuss the concepts of Hospice at home when/if the time comes.   We discuss the use of chemo and the associated side effects and difficulties. Daughter Bebe Liter states they will not consider chemo.    We discussed imaging such as PET scans, and the idea of use for prognostication, but looking to [name] [name] for indicators of decline (continued wt loss, lethargy, increased pain).  Symptom Management:   Tramadol 50 mg PO BID  Lidoderm patch  Palliative Prophylaxis: Colace 100 mg PO BID.   Psycho-social/Spiritual:   Support  System: Lives in home with wife who has mild dementia. Daughter Rosanne Ashing and her family live one street over. Neighbor, Margaretmary Lombard helps to provide care.   Desire for further Chaplaincy support:no  Prognosis: Unable to determine  Discharge Planning:  [facility] for rehab with Palliative care service follow-up       Chief Complaint:   Back pain  History of Present Illness:  [name] [name] is a 81 y.o. male with a hx of anemia, DM type 2, HTN, CAD, HLD, and CKD stage III that presents with multiple complaints. Patient was admitted to this hospital from 11/10-11/11 with symptomatic iron deficiency anemia. He had heme negative stool at that time and a hgb of 5.3, requiring a transfusion of 2U. Upon discharge he was feeling better with a hgb of 8.0 and plans were to follow up at [facility] for a colonoscopy with fluoroscopy and overtube. Patient has not yet followed up. He reports feeling better for about a week after discharge until he began experiencing fatigue, lack of appetite, intermittent nausea, and constipation. Per daughter, his constipation began shortly after he started taking iron supplementation. Patient reports he passed a small amount of dark stool yesterday with very minimal BRB in it. He has been taking Colace without relief. He also complains of lower back pain worse with movement. Per family members, he has had three unwitnessed falls since his last admission. Patient reports his falls are due to "blacking out". Daughter also reports a gradual weight loss over the last few  months and periods of depression. He denies any SOB, CP, fever, chills, headache, or urinary changes.  While in the ED, vitals revealed a BP of 100/46 and a HR of 106. Labs were significant for a Hgb of 6.6, WBC 14.1, Sodium 130, Creatinine 2.10 and a BUN of 31. EKG was relatively unremarkable. Xray of his lumbar spine revealed an acute body fracture of L4 with mild height loss and possible rectal  impaction. EDP initiated a transfusion of 2U PRBCs. He will be admitted for further management.   Of note, patient was also seen by GI in 02/2015 for lower GI bleeding s/p colonoscopy findings of rectal bleeding from small internal hemorrhoids and moderate external hemorrhoids. Colonoscopy was incomplete secondary to severely redundant L colon. EGD notable for gastritis and duodenitis. Patient was recommended to follow up at [facility] for colonoscopy with fluoroscopy and overtube at that time as well however he declined to keep that appointment.  Primary Diagnoses  Present on Admission:  . Symptomatic anemia . Essential hypertension, benign . Acute renal failure superimposed on stage 3 chronic kidney disease ([place]) . Dehydration . Fecal impaction in rectum ([place]) . Constipation . Falls . Lumbar compression fracture ([place]) . Hyperkalemia . Hyponatremia . BPH (benign prostatic hypertrophy)  Palliative Review of Systems: [name] [name] tells me that his back hurts, more than before his surgery.  Ultram prescribed.  I have reviewed the medical record, interviewed the patient and family, and examined the patient. The following aspects are pertinent.  Past Medical History  Diagnosis Date  . Diabetes mellitus, type 2 ([place])   . Essential hypertension, benign   . Coronary atherosclerosis of native coronary artery 1992    Multivessel s/p CABG 1992  . Hyperlipidemia   . TIA (transient ischemic attack)   . History of pneumonia   . Benign prostatic hypertrophy   . Chronic anemia    Social History   Social History  . Marital Status: Married    Spouse Name: N/A  . Number of Children: N/A  . Years of Education: N/A   Occupational History  . Retired    Social History Main Topics  . Smoking status: Former Smoker -- 31 years    Types: Cigarettes    Start date: 05/26/1939    Quit date: 06/20/1994  . Smokeless tobacco: None  . Alcohol Use: No  . Drug Use: No  . Sexual Activity: No   Other  Topics Concern  . None   Social History Narrative   Family History  Problem Relation Age of Onset  . Cancer Father   . Cancer Mother    Scheduled Meds: . amoxicillin-clavulanate  1 tablet Oral Q12H  . antiseptic oral rinse  7 mL Mouth Rinse BID  . clonazePAM  1 mg Oral QHS  . docusate sodium  100 mg Oral BID  . enoxaparin (LOVENOX) injection  40 mg Subcutaneous Q24H  . feeding supplement (ENSURE ENLIVE)  237 mL Oral BID BM  . feeding supplement (PRO-STAT SUGAR FREE 64)  30 mL Oral BID  . guaiFENesin  1,200 mg Oral BID  . Influenza vac split quadrivalent PF  0.5 mL Intramuscular Tomorrow-1000  . insulin aspart  0-15 Units Subcutaneous TID WC  . lidocaine  1 patch Transdermal Q24H  . metoprolol tartrate  25 mg Oral BID  . mirtazapine  15 mg Oral QHS  . pantoprazole  40 mg Oral Daily  . pneumococcal 23 valent vaccine  0.5 mL Intramuscular Tomorrow-1000  . sodium chloride  3  mL Intravenous Q12H  . tamsulosin  0.4 mg Oral Daily  . traMADol  50 mg Oral Q12H   Continuous Infusions:  PRN Meds:.acetaminophen **OR** acetaminophen, haloperidol lactate, hydrALAZINE, HYDROcodone-acetaminophen, ondansetron **OR** ondansetron (ZOFRAN) IV, RESOURCE THICKENUP CLEAR, simethicone Medications Prior to Admission:  Prior to Admission medications   Medication Sig Start Date End Date Taking? Authorizing Provider  acetaminophen (TYLENOL) 650 MG CR tablet Take 650 mg by mouth every 8 (eight) hours as needed for pain.   Yes Historical Provider, MD  albuterol (PROVENTIL HFA;VENTOLIN HFA) 108 (90 BASE) MCG/ACT inhaler Inhale 2 puffs into the lungs every 6 (six) hours as needed for wheezing or shortness of breath.   Yes Historical Provider, MD  Cholecalciferol (VITAMIN D PO) Take 1 capsule by mouth daily.   Yes Historical Provider, MD  clonazePAM (KLONOPIN) 0.5 MG tablet Take 1 mg by mouth at bedtime.  01/29/15  Yes Historical Provider, MD  Docusate Sodium (RA COL-RITE PO) Take 2 capsules by mouth at bedtime.    Yes Historical Provider, MD  ferrous sulfate 325 (65 FE) MG tablet Take 1 tablet (325 mg total) by mouth daily with breakfast. 04/05/15  Yes Donne Hazel, MD  finasteride (PROSCAR) 5 MG tablet Take 1 tablet by mouth daily. 04/09/15  Yes Historical Provider, MD  Magnesium 200 MG TABS Take 1 tablet by mouth daily.   Yes Historical Provider, MD  metFORMIN (GLUCOPHAGE) 500 MG tablet Take 500 mg by mouth 2 (two) times daily with a meal.  12/13/13  Yes Historical Provider, MD  Multiple Vitamins-Minerals (ONE-A-DAY 50 PLUS PO) Take 1 tablet by mouth daily.   Yes Historical Provider, MD  Omega-3 Fatty Acids (FISH OIL) 1200 MG CAPS Take 1 capsule by mouth daily.   Yes Historical Provider, MD  pantoprazole (PROTONIX) 40 MG tablet Take 1 tablet (40 mg total) by mouth daily before breakfast. 02/25/15  Yes Orvan Falconer, MD  quinapril (ACCUPRIL) 20 MG tablet take 1 tablet by mouth once daily 11/28/14  Yes Satira Sark, MD  Tamsulosin HCl (FLOMAX) 0.4 MG CAPS Take 0.4 mg by mouth daily.  07/18/11  Yes Historical Provider, MD  traMADol (ULTRAM) 50 MG tablet Take 1 tablet by mouth every 6 (six) hours as needed for moderate pain.  04/17/15  Yes Historical Provider, MD  vitamin B-12 (CYANOCOBALAMIN) 1000 MCG tablet Take 1,000 mcg by mouth daily.   Yes Historical Provider, MD  cyclobenzaprine (FLEXERIL) 5 MG tablet Take 1 tablet (5 mg total) by mouth 3 (three) times daily as needed for muscle spasms. Patient not taking: Reported on 04/27/2015 04/05/15   Donne Hazel, MD  levofloxacin (LEVAQUIN) 250 MG tablet Take by mouth daily. Starting 04/13/2015, 2 tablets on the first day, then 1 tablet daily for 13 days. 04/13/15   Historical Provider, MD   Allergies  Allergen Reactions  . Ambien [Zolpidem Tartrate] Other (See Comments)    May cause agitation and confusion per family  . Aspirin Other (See Comments)    unknown  . Morphine And Related Other (See Comments)    Causes increased confusion   CBC:    Component  Value Date/Time   WBC 6.5 05/10/2015 0616   HGB 8.7* 05/10/2015 0616   HCT 27.7* 05/10/2015 0616   PLT 243 05/10/2015 0616   MCV 87.7 05/10/2015 0616   NEUTROABS 12.8* 04/27/2015 1221   LYMPHSABS 0.6* 04/27/2015 1221   MONOABS 0.6 04/27/2015 1221   EOSABS 0.2 04/27/2015 1221   BASOSABS 0.0 04/27/2015 1221  Comprehensive Metabolic Panel:    Component Value Date/Time   NA 137 05/10/2015 0616   K 3.8 05/10/2015 0616   CL 101 05/10/2015 0616   CO2 31 05/10/2015 0616   BUN 13 05/10/2015 0616   CREATININE 1.20 05/10/2015 0616   CREATININE 1.67* 01/12/2014 0840   GLUCOSE 137* 05/10/2015 0616   CALCIUM 8.4* 05/10/2015 0616   AST 9* 04/28/2015 0541   ALT 11* 04/28/2015 0541   ALKPHOS 53 04/28/2015 0541   BILITOT 0.8 04/28/2015 0541   PROT 5.9* 04/28/2015 0541   ALBUMIN 2.4* 04/28/2015 0541    Physical Exam: Vital Signs: BP 134/66 mmHg  Pulse 94  Temp(Src) 97.2 F (36.2 C) (Oral)  Resp 16  Ht 5\' 5"  (1.651 m)  Wt 60.3 kg (132 lb 15 oz)  BMI 22.12 kg/m2  SpO2 96% SpO2: SpO2: 96 % O2 Device: O2 Device: Not Delivered O2 Flow Rate: O2 Flow Rate (L/min): 3 L/min Intake/output summary:  Intake/Output Summary (Last 24 hours) at 05/10/15 1458 Last data filed at 05/09/15 1754  Gross per 24 hour  Intake    360 ml  Output    200 ml  Net    160 ml   LBM: Last BM Date: 05/09/15 Baseline Weight: Weight: 55.6 kg (122 lb 9.2 oz) Most recent weight: Weight: 60.3 kg (132 lb 15 oz)  Exam Findings:  Constitutional: Ill appearing. Frail elderly. He is oriented to person, place.  Thin malnourished.  No distress.   Cardiovascular: RRR, no edema. . Pulmonary/Chest:  Even and non labored. no wheezes, diminished bases.    Abdominal: Soft, rounded. Dressing CDI Musculoskeletal: Weakness, but able to stand with assist.  Neurological: He is alert and oriented to person, place.  Makes and keeps eye contact.   Nursing note and vitals reviewed.         Palliative Performance Scale: 40% at  this time.               Additional Data Reviewed: Recent Labs     05/08/15  0602  05/10/15  0616  WBC  8.2  6.5  HGB  9.2*  8.7*  PLT  210  243  NA  138  137  BUN  15  13  CREATININE  1.25*  1.20     Time In: 1300 Time Out: 1420 Time Total: 80 minutes GOC discussion shared with nursing staff, CM, SW, and Dr. Roderic Palau.  Greater than 50%  of this time was spent counseling and coordinating care related to the above assessment and plan.  Signed by: Drue Novel, NP  Drue Novel, NP  05/10/2015, 2:58 PM  Please contact Palliative Medicine Team phone at [phone] for questions and concerns.

## 2015-05-10 NOTE — Care Management Important Message (Signed)
Important Message  Patient Details  Name: [name] [name] MRN: [id number] Date of Birth: [birth year]   Medicare Important Message Given:  Yes    Joylene Draft, RN 05/10/2015, 12:54 PM

## 2015-05-10 NOTE — Progress Notes (Signed)
[facility] PROGRESS NOTE  TYMEER TURKOVICH [id number] DOB: [birth year] DOA: 04/27/2015 PCP: [name], MD  Assessment/Plan: 1. Distal small bowel mass revealed on CT A/P. General surgery performed ExLap 12/9 with partial small bowel resection. 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 CT chest did not reveal any mass. He will follow up with oncology as an outpatient. Will advance diet per ST. Palliative care to meet with family today to establish goals of care and to discuss expected progression of illness.  2. Symptomatic blood loss anemia, secondary to small bowel mass. Stable s/p transfusion of 4U PRBCs. No evidence of ongoing bleeding. Will monitor closely.  3. HCAP. CXR revealed dense left lung opacity. Will continue oral abx. ST following, diet per their recommendations. WBC wnl, afebrile. 4. Sepsis secondary to #3. WBC wnl, Afebrile. BP stable. Continue abx 5. Tachycardia, improved however heart sounds irregular. Repeat EKG revealed sinus tachycardia with PACs. Bilateral US negative for DVT. Will increase Metoprolol.  6. Back pain secondary to lumbar compression fx. Family is reluctant to start narcotic pain medication due to effects on mental status. Will start the patient on a Lidocaine patch.  7. DM type 2, stable. Continue SSI. 8. BPH, stable. Continue Flomax. 9. Essential HTN, stable. Continue Metoprolol and Hydralazine as needed. 10. Hyponatremia, repleted. 11. Hypokalemia with hypomagnesemia, repleted.  12. Anorexia, continue diet per ST.  Started on Remeron.  13. CKD stage III, appears to be at baseline.   Code Status: DNR DVT prophylaxis: Lovenox Family Communication: Discussed with patient who understands and has no concerns at this time. Disposition Plan: Discharge to SNF, hopefully in the next 24 hours.    Consultants:  Surgery  GI  Cardiology  Procedures:  Transfusion 4U PRBCs.   ExLap 12/9 with  partial small bowel resection  Antibiotics:  Cipro 12/9>>12/9  Flagy 12/9>>12/9  Zosyn  12/12>>12/15  Vancomycin 12/12>>12/15  Augmentin 12/15>>  HPI/Subjective: Patient is sleeping but briefly wakes up to say he is having a cough . Per daughter, he has been restless and complaining of back pain.   Objective: Filed Vitals:   05/09/15 2110 05/10/15 0533  BP: 130/47 116/46  Pulse: 113 96  Temp: 98.6 F (37 C) 97.2 F (36.2 C)  Resp: 17 16    Intake/Output Summary (Last 24 hours) at 05/10/15 0747 Last data filed at 05/09/15 1754  Gross per 24 hour  Intake    600 ml  Output    325 ml  Net    275 ml   Filed Weights   05/04/15 0400 05/05/15 0500 05/06/15 0500  Weight: 57 kg (125 lb 10.6 oz) 59.3 kg (130 lb 11.7 oz) 60.3 kg (132 lb 15 oz)    Exam:  General: NAD, looks comfortable Cardiovascular: Regular heart sounds with occasional PACs  Respiratory: Coarse breath sounds bilaterally, No wheezing  Abdomen: soft,  Non tender, no distention , bowel sounds normal Musculoskeletal: No edema b/l   Data Reviewed: Basic Metabolic Panel:  Recent Labs Lab 05/04/15 0432 05/05/15 0443 05/06/15 0440 05/07/15 0554 05/08/15 0602 05/10/15 0616  NA 134* 131* 133* 136 138 137  K 4.2 4.2 4.6 4.2 4.1 3.8  CL 101 101 102 106 106 101  CO2 25 23 23 24 26 31   GLUCOSE 135* 124* 133* 131* 112* 137*  BUN 13 10 15 17 15 13   CREATININE 1.31* 1.16 1.33* 1.28* 1.25* 1.20  CALCIUM 8.2* 8.0* 8.3* 7.8* 8.2* 8.4*  MG 1.3* 1.8 1.7  --   --   --  PHOS 3.7 2.4* 2.8  --   --   --     CBC:  Recent Labs Lab 05/05/15 0443 05/06/15 0440 05/07/15 0554 05/08/15 0602 05/10/15 0616  WBC 10.8* 16.4* 11.7* 8.2 6.5  HGB 10.5* 10.1* 8.7* 9.2* 8.7*  HCT 32.4* 31.5* 27.1* 28.8* 27.7*  MCV 87.1 87.5 88.3 88.6 87.7  PLT 199 218 203 210 243    BNP (last 3 results)  Recent Labs  02/22/15 1446  BNP 84.0     CBG:  Recent Labs Lab 05/08/15 2051 05/09/15 0732 05/09/15 1129  05/09/15 1601 05/09/15 2108  GLUCAP 136* 108* 140* 126* 157*    Recent Results (from the past 240 hour(s))  MRSA PCR Screening     Status: None   Collection Time: 05/02/15 11:45 AM  Result Value Ref Range Status   MRSA by PCR NEGATIVE NEGATIVE Final    Comment:        The GeneXpert MRSA Assay (FDA approved for NASAL specimens only), is one component of a comprehensive MRSA colonization surveillance program. It is not intended to diagnose MRSA infection nor to guide or monitor treatment for MRSA infections.      Studies: Ct Chest W Contrast  05/09/2015  CLINICAL DATA:  Abnormal chest radiograph with left lower lobe opacity EXAM: CT CHEST WITH CONTRAST TECHNIQUE: Multidetector CT imaging of the chest was performed during intravenous contrast administration. CONTRAST:  57mL OMNIPAQUE IOHEXOL 300 MG/ML  SOLN COMPARISON:  05/06/2015 FINDINGS: There is consolidation in the left lower lobe involving the posterior lateral aspect with air bronchograms throughout the area of abnormality. There is also mild infiltrate across the fissure in the posterior inferior lingula. There is infiltrate in the posterior aspect of the right upper lobe. There is a small loculated left pleural effusion extending into the fissure. On the right, there is a moderate pleural effusion. There is dependent consolidation in the posterior right lower lobe most likely representing atelectasis related to the effusion. Thoracic inlet and thyroid are normal. There is moderate calcification of the thoracic aorta. There are numerous mediastinal lymph nodes. The largest is a precarinal lymph node measuring 11 mm. There is coronary artery calcification. There is no significant pericardial effusion. In this patient that is status post abdominal surgery there is again noted to be pneumoperitoneum as described on report of prior chest radiograph. There is a small volume of free fluid in the upper abdomen. IMPRESSION: Evidence of  multifocal pneumonia on the left involving the lower lobe and lingula with small partially loculated left effusion. Moderate right pleural effusion with consolidation most consistent with atelectasis at the lung base. Infiltrate right upper lobe could represent pneumonia/ pneumonitis. Mildly enlarged mediastinal lymph nodes possibly reactive. Pneumoperitoneum as previously described likely related to postsurgical status. Electronically Signed   By: Skipper Cliche M.D.   On: 05/09/2015 16:59    Scheduled Meds: . amoxicillin-clavulanate  1 tablet Oral Q12H  . antiseptic oral rinse  7 mL Mouth Rinse BID  . clonazePAM  1 mg Oral QHS  . enoxaparin (LOVENOX) injection  40 mg Subcutaneous Q24H  . feeding supplement (ENSURE ENLIVE)  237 mL Oral BID BM  . feeding supplement (PRO-STAT SUGAR FREE 64)  30 mL Oral BID  . guaiFENesin  1,200 mg Oral BID  . Influenza vac split quadrivalent PF  0.5 mL Intramuscular Tomorrow-1000  . insulin aspart  0-15 Units Subcutaneous TID WC  . metoprolol tartrate  12.5 mg Oral BID  . mirtazapine  15 mg Oral  QHS  . pantoprazole  40 mg Oral Daily  . pneumococcal 23 valent vaccine  0.5 mL Intramuscular Tomorrow-1000  . sodium chloride  3 mL Intravenous Q12H  . tamsulosin  0.4 mg Oral Daily   Continuous Infusions:    Active Problems:   Diabetes mellitus, type 2 ([place])   Essential hypertension, benign   Symptomatic anemia   Acute renal failure superimposed on stage 3 chronic kidney disease ([facility])   Dehydration   Fecal impaction in rectum ([facility])   Constipation   Falls   Lumbar compression fracture ([facility])   Hyperkalemia   Hyponatremia   BPH (benign prostatic hypertrophy)   Protein-calorie malnutrition, severe   Small bowel mass   Unilateral recurrent inguinal hernia without obstruction or gangrene   Abnormality of rectum   Palliative care encounter    Time spent: 35 minutes  Satoya Feeley. MD [facility] Pager [phone]. If 7PM-7AM, please contact  night-coverage at [email or web address], password [facility] 05/10/2015, 7:47 AM  LOS: 13 days     By signing my name below, I, Rosalie Doctor, attest that this documentation has been prepared under the direction and in the presence of [employer]. MD Electronically Signed: Rosalie Doctor, Scribe. 05/10/2015  1:12pm  I, Dr. Kathie Dike, personally performed the services described in this documentaiton. All medical record entries made by the scribe were at my direction and in my presence. I have reviewed the chart and agree that the record reflects my personal performance and is accurate and complete  Kathie Dike, MD, 05/10/2015 1:33 PM

## 2015-05-10 NOTE — Progress Notes (Signed)
Speech Language Pathology Treatment: Dysphagia  Patient Details Name: [name] [name] MRN: [id number] DOB: 10-24-1930 Today's Date: 05/10/2015 Time: [id number] SLP Time Calculation (min) (ACUTE ONLY): 14 min  Assessment / Plan / Recommendation Clinical Impression  Pt was seen at bedside and was agreeable to PO trials for diet upgrade; patient very alert and he was responsive to cues and recommendations.  Pt consumed 150 cc's of thin liquids by cup sips with one episode of with mild wet vocal quality (after ~90cc's) which was reflexively throat cleared and followed by a dry swallow independent of cues. Recommend upgrade to thin liquids and continue with D3 (mechanical soft) given decreased s/sx of aspiration, improved usage of compensatory strategies and improved usage. Recommend NO STRAWS and ST to f/u in next level of care (Recommended SNF). Pt's daughter was present for session and provided education of recommendations including aspiration precautions.   HPI HPI: [name] is an 81 year old male with a hx of anemia, DM type 2, HTN, CAD, HLD, and CKD stage III that presents with chronic anemia with occult GI bleeding. Transfused 2 units PRBC,Colonoscopy was performed which revealed an extrinsic mass pushing on the colon. A CT scan reveals a possible small bowel neoplasm or inflammatory mass in the distal small bowel in the pelvis, patient had exploratory laparotomy with partial small bowel resection on 12/9 by Dr. Arnoldo Morale, recovering well postop, unfortunately patient developd HCAP, acted on vac and Zosyn 12/12. SLP asked to evaluate swallow.      SLP Plan  Continue with current plan of care     Recommendations  Diet recommendations: Dysphagia 3 (mechanical soft);Thin liquid Liquids provided via: Cup Medication Administration: Whole meds with puree Supervision: Full supervision/cueing for compensatory strategies;Staff to assist with self feeding Compensations: Slow rate;Small  sips/bites;Multiple dry swallows after each bite/sip;Clear throat intermittently;Hard cough after swallow;Effortful swallow Postural Changes and/or Swallow Maneuvers: Seated upright 90 degrees;Upright 30-60 min after meal              Oral Care Recommendations: Oral care BID Follow up Recommendations: Skilled Nursing facility Plan: Continue with current plan of care  Tashawn Greff H. Roddie Mc, CCC-SLP Speech Language Pathologist  Wende Bushy 05/10/2015, 2:33 PM

## 2015-05-10 NOTE — Progress Notes (Signed)
PT Cancellation Note  Patient Details Name: [name] [name] MRN: [id number] DOB: [birth year]   Cancelled Treatment:    Reason Eval/Treat Not Completed: Fatigue/lethargy limiting ability to participate.  Will try again later today if possible.   [name] L  PT 05/10/2015, 11:19 AM [phone]

## 2015-05-11 DIAGNOSIS — Z7189 Other specified counseling: Secondary | ICD-10-CM

## 2015-05-11 LAB — GLUCOSE, CAPILLARY
GLUCOSE-CAPILLARY: 105 mg/dL — AB (ref 65–99)
GLUCOSE-CAPILLARY: 157 mg/dL — AB (ref 65–99)
GLUCOSE-CAPILLARY: 130 mg/dL — AB (ref 65–99)
Glucose-Capillary: 107 mg/dL — ABNORMAL HIGH (ref 65–99)
Glucose-Capillary: 80 mg/dL (ref 65–99)

## 2015-05-11 MED ORDER — FLUCONAZOLE 100 MG PO TABS: 100.0000 mg | ORAL_TABLET | Freq: Every day | ORAL | Status: AC

## 2015-05-11 MED ORDER — AMOXICILLIN-POT CLAVULANATE 875-125 MG PO TABS: 1.0000 | ORAL_TABLET | Freq: Two times a day (BID) | ORAL | Status: DC

## 2015-05-11 MED ORDER — METOPROLOL TARTRATE 25 MG PO TABS: 25.0000 mg | ORAL_TABLET | Freq: Two times a day (BID) | ORAL | Status: AC

## 2015-05-11 MED ORDER — MIRTAZAPINE 15 MG PO TABS: 15.0000 mg | ORAL_TABLET | Freq: Every day | ORAL | Status: DC

## 2015-05-11 MED ORDER — LIDOCAINE 5 % EX PTCH: 1.0000 | MEDICATED_PATCH | CUTANEOUS | Status: DC

## 2015-05-11 MED ORDER — TRAMADOL HCL 50 MG PO TABS: 50.0000 mg | ORAL_TABLET | Freq: Four times a day (QID) | ORAL | Status: DC | PRN

## 2015-05-11 MED ORDER — MAGIC MOUTHWASH W/LIDOCAINE: 5.0000 mL | Freq: Three times a day (TID) | ORAL | Status: AC

## 2015-05-11 NOTE — Progress Notes (Signed)
Patients sitter was discontinued at 1500.  Discussed with the patients daughters his plan of care in reference to discharge.  They did verbalize concerns about patient not being discharged and the relation to his medicare.  Voiced that we could talk to [facility] the Chief Technology Officer.

## 2015-05-11 NOTE — Progress Notes (Signed)
Clinical Social Work  CSW was informed by Therapist, sports that pt is ready to DC. CSW spoke with [facility] who reports they were unaware of weekend DC and are unable to accept patient until Monday at the earliest. CSW spoke with assistant director Nathaniel Man) who was agreeable to contact [facility] to inquire about admission. Per chart review, weekday SW had spoken to admissions and family had accepted bed.  CSW spoke with dtrs [name] and Thayer Headings) who are upset re: DC plans. Dtrs report they have made their plans and only want pt to DC to [facility]. Family is agreeable to provide 24 hour supervision at [facility]. CSW explained that [facility] is not available at this time and that search would need to be expanded. Family reports they are going to talk with [facility] as well and wanted CSW to alert Surveyor, quantity about their frustrations. CSW updated Surveyor, quantity and Physiological scientist. CSW left handoff for Sunday CSW to follow up with family re: other offers or if any further changes with [facility].   Pt needs to be sitter free for 24 hours prior to DC to SNF. CSW alerted RN to assist with DC sitter for DC on 12/18 to SNF.   CSW will continue to follow.  Sindy Messing, LCSW Weekend Coverage

## 2015-05-11 NOTE — Progress Notes (Signed)
Patient's incision is healing well. Recent discussions about disposition the patient reviewed. We will remove staples from incision on day of discharge.

## 2015-05-11 NOTE — Discharge Summary (Signed)
Physician Discharge Summary  [name] [name] KPT:[id number] DOB: 22-Oct-1930 DOA: 04/27/2015  PCP: [name], MD  Admit date: 04/27/2015 Discharge date: 05/11/2015  Time spent: 35 minutes  Recommendations for Outpatient Follow-up:  1. Patient will be discharged to [facility]. 2. Repeat CXR in 3-4 weeks for resolution of PNA. 3. 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  4. Patient found to have sinus tachycardia with PACs. Would titrate Metoprolol for optimal HR control as BP tolerates. 5. Consider speech therapy consult to advance diet as tolerated. 6. Repeat CBC in 1 week to follow Hgb.    Discharge Diagnoses:  Active Problems:   Diabetes mellitus, type 2 ([place])   Essential hypertension, benign   Symptomatic anemia   Acute renal failure superimposed on stage 3 chronic kidney disease (HCC)   Dehydration   Fecal impaction in rectum (HCC)   Constipation   Falls   Lumbar compression fracture (HCC)   Hyperkalemia   Hyponatremia   BPH (benign prostatic hypertrophy)   Protein-calorie malnutrition, severe   Small bowel mass   Unilateral recurrent inguinal hernia without obstruction or gangrene   Abnormality of rectum   Palliative care encounter   DNR (do not resuscitate) discussion   Discharge Condition: Stable  Diet recommendation: Dysphagia 3 diet with nectar thick liquids.   Filed Weights   05/05/15 0500 05/06/15 0500 05/11/15 0650  Weight: 59.3 kg (130 lb 11.7 oz) 60.3 kg (132 lb 15 oz) 58.333 kg (128 lb 9.6 oz)    History of present illness:  81 y.o. male with a hx of anemia, DM type 2, HTN, CAD, HLD, and CKD stage III  presented with multiple complaints. Patient was admitted to this hospital from 11/10-11/11 with symptomatic iron deficiency anemia. He had heme negative stool at that time and a hgb of 5.3, requiring a transfusion of 2U. Upon discharge he was feeling better with a hgb of 8.0 and plans were to follow up at [facility]  for a colonoscopy with fluoroscopy and overtube. Patient has not yet followed up. He reported feeling better for about a week after discharge until he began experiencing fatigue, lack of appetite, intermittent nausea, and constipation. Per daughter, his constipation began shortly after he started taking iron supplementation. Patient reported he passed a small amount of dark stool the day prior to admission with very minimal BRB in it. He has been taking Colace without relief. He also complained of lower back pain worse with movement.  Daughter also reports a gradual weight loss over the last few months and periods of depression. While in the ED, vitals revealed a BP of 100/46 and a HR of 106. Labs were significant for a Hgb of 6.6, WBC 14.1, Sodium 130, Creatinine 2.10 and a BUN of 31. EKG was relatively unremarkable. Xray of his lumbar spine revealed an acute body fracture of L4 with mild height loss and possible rectal impaction. EDP initiated a transfusion of 2U PRBCs. He was admitted for further management.     Hospital Course:  Patient presented with fatigue and recurrent rectal bleeding. He was admitted to this hospital from 11/10-11/11 with symptomatic iron deficiency anemia.  He was transfused 2U and discharged with a stable Hgb. Upon readmission, he had a Hgb of 6.6 with a heme positive stool. Patient was seen by GI as well as general surgery and work up revealed a small bowel mass. He underwent ExLap on 12/9 with partial small bowel resection. 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 A CT chest was negative for primary lung carcinoma. Palliative care consulted and met with the patient and family. Collectively, they have decided to change the patient's code status to DNR and allow a natural death. He will be transferred to [facility] with plans to follow up with oncology as an outpatient to discuss further workup/treatment options.    1. Symptomatic blood loss anemia, secondary to small bowel mass. Stable s/p transfusion of 4U PRBCs. No evidence of ongoing bleeding.   2. HCAP/ Aspiration PNA. CXR revealed dense left lung opacity. Patient treated with IV abx and transitioned to oral abx prior to discharge. He remained afebrile with a normal WBC. ST consulted and recommended a nectar-thick liquid diet. He will need a repeat CXR in 3-4 weeks and continued ST follow up.  3. Sepsis secondary to #3. WBC wnl, Afebrile. BP remained stable. Continue oral abx upon discharge. 4. Tachycardia, improved however heart sounds were irregular. EKG revealed sinus tachycardia with PACs. Bilateral US negative for DVT. He has been started on metoprolol with better rate control. This can be further titrated as an outpatient as his BP tolerates. 5. Back pain secondary to lumbar compression fx. He has been started on a Lidocaine patch as well as Tramadol. He will need physical therapy.  6. DM type 2, stable. Continue home management. 7. BPH, stable. Continue Flomax and Finasteride.  8. Essential HTN, stable. Continue home management. 9. Hyponatremia, repleted. 10. Hypokalemia with hypomagnesemia, repleted.  11. Anorexia, felt to be related to depression/underlying malignancy. Started on Remeron for depression as well as appetite stimulant.  12. CKD stage III, appears to be at baseline.   Consultants:  Surgery  GI  Cardiology for surgical clearance  Procedures:  Transfusion 4U PRBCs.  ExLap 12/9 with partial small bowel resection  Discharge Exam: Filed Vitals:   05/10/15 2048 05/11/15 0650  BP: 145/79 124/62  Pulse: 106 78  Temp: 98.2 F (36.8 C) 98.4 F (36.9 C)  Resp: 18 18     General: NAD, looks comfortable  Cardiovascular: Regular rate  with occasional PACs   Respiratory: coarse breath sounds bilaterally, No wheezing   Abdomen: soft, non tender, no distention , bowel sounds normal  Musculoskeletal: No edema  b/l   Discharge Instructions   Discharge Instructions    Diet - low sodium heart healthy    Complete by:  As directed      Increase activity slowly    Complete by:  As directed           Current Discharge Medication List    START taking these medications   Details  amoxicillin-clavulanate (AUGMENTIN) 875-125 MG tablet Take 1 tablet by mouth every 12 (twelve) hours. Qty: 18 tablet, Refills: 0    fluconazole (DIFLUCAN) 100 MG tablet Take 1 tablet (100 mg total) by mouth daily. Qty: 14 tablet, Refills: 0    lidocaine (LIDODERM) 5 % Place 1 patch onto the skin daily. Remove & Discard patch within 12 hours or as directed by MD Qty: 30 patch, Refills: 0    magic mouthwash w/lidocaine SOLN Take 5 mLs by mouth 3 (three) times daily. Refills: 0    metoprolol tartrate (LOPRESSOR) 25 MG tablet Take 1 tablet (25 mg total) by mouth 2 (two) times daily.    mirtazapine (REMERON) 15 MG tablet Take 1 tablet (15 mg total) by mouth at bedtime.      CONTINUE these medications which have CHANGED   Details  traMADol (ULTRAM) 50 MG tablet Take 1 tablet (50 mg total) by mouth every 6 (six) hours as needed for moderate pain. Qty: 30 tablet, Refills: 0      CONTINUE these medications which have NOT CHANGED   Details  acetaminophen (TYLENOL) 650 MG CR tablet Take 650 mg by mouth every 8 (eight) hours as needed for pain.    albuterol (PROVENTIL HFA;VENTOLIN HFA) 108 (90 BASE) MCG/ACT inhaler Inhale 2 puffs into the lungs every 6 (six) hours as needed for wheezing or shortness of breath.    Cholecalciferol (VITAMIN D PO) Take 1 capsule by mouth daily.    clonazePAM (KLONOPIN) 0.5 MG tablet Take 1 mg by mouth at bedtime.  Refills: 0    Docusate Sodium (RA COL-RITE PO) Take 2 capsules by mouth at bedtime.    ferrous sulfate 325 (65 FE) MG tablet Take 1 tablet (325 mg total) by mouth daily with breakfast. Qty: 30 tablet, Refills: 0    finasteride (PROSCAR) 5 MG tablet Take 1 tablet by mouth  daily. Refills: 0    Magnesium 200 MG TABS Take 1 tablet by mouth daily.    metFORMIN (GLUCOPHAGE) 500 MG tablet Take 500 mg by mouth 2 (two) times daily with a meal.     Multiple Vitamins-Minerals (ONE-A-DAY 50 PLUS PO) Take 1 tablet by mouth daily.    Omega-3 Fatty Acids (FISH OIL) 1200 MG CAPS Take 1 capsule by mouth daily.    pantoprazole (PROTONIX) 40 MG tablet Take 1 tablet (40 mg total) by mouth daily before breakfast. Qty: 30 tablet, Refills: 1    Tamsulosin HCl (FLOMAX) 0.4 MG CAPS Take 0.4 mg by mouth daily.     vitamin B-12 (CYANOCOBALAMIN) 1000 MCG tablet Take 1,000 mcg by mouth daily.      STOP taking these medications     quinapril (ACCUPRIL) 20 MG tablet      cyclobenzaprine (FLEXERIL) 5 MG tablet      levofloxacin (LEVAQUIN) 250 MG tablet        Allergies  Allergen Reactions  . Ambien [Zolpidem Tartrate] Other (See Comments)    May cause agitation and confusion per family  . Aspirin Other (See Comments)    unknown  . Morphine And Related Other (See Comments)    Causes increased confusion   Follow-up Information    Follow up with [facility].   Contact information:   [address] [place] [address] [phone]       Follow up with Molli Hazard, MD In 2 weeks.   Specialties:  Hematology and Oncology, Oncology   Why:  2 weeks after discharge. please schedule   Contact information:   [address] [place] [address] [phone]        The results of significant diagnostics from this hospitalization (including imaging, microbiology, ancillary and laboratory) are listed below for reference.    Significant Diagnostic Studies: Dg Lumbar Spine Complete  04/27/2015  CLINICAL DATA:  Low back pain.  Recent falls. EXAM: LUMBAR SPINE - COMPLETE 4+ VIEW COMPARISON:  04/04/2015 FINDINGS: There is an L4 body fracture with superior and inferior endplate compression deformities. Height loss is approximately 25% at  maximum. An L1 superior endplate fracture is chronic. No subluxation. Spondylosis without focal or advanced disc narrowing. Extensive aortic atherosclerosis. Large stool volume, especially in the rectum. Osteopenia. IMPRESSION: 1. L4 vertebral body acute fracture with mild height loss. 2. Large stool volume with potential rectal impaction. Electronically Signed   By: Angelica Chessman  Watts M.D.   On: 04/27/2015 14:12   Ct Chest W Contrast  05/09/2015  CLINICAL DATA:  Abnormal chest radiograph with left lower lobe opacity EXAM: CT CHEST WITH CONTRAST TECHNIQUE: Multidetector CT imaging of the chest was performed during intravenous contrast administration. CONTRAST:  18m OMNIPAQUE IOHEXOL 300 MG/ML  SOLN COMPARISON:  05/06/2015 FINDINGS: There is consolidation in the left lower lobe involving the posterior lateral aspect with air bronchograms throughout the area of abnormality. There is also mild infiltrate across the fissure in the posterior inferior lingula. There is infiltrate in the posterior aspect of the right upper lobe. There is a small loculated left pleural effusion extending into the fissure. On the right, there is a moderate pleural effusion. There is dependent consolidation in the posterior right lower lobe most likely representing atelectasis related to the effusion. Thoracic inlet and thyroid are normal. There is moderate calcification of the thoracic aorta. There are numerous mediastinal lymph nodes. The largest is a precarinal lymph node measuring 11 mm. There is coronary artery calcification. There is no significant pericardial effusion. In this patient that is status post abdominal surgery there is again noted to be pneumoperitoneum as described on report of prior chest radiograph. There is a small volume of free fluid in the upper abdomen. IMPRESSION: Evidence of multifocal pneumonia on the left involving the lower lobe and lingula with small partially loculated left effusion. Moderate right pleural  effusion with consolidation most consistent with atelectasis at the lung base. Infiltrate right upper lobe could represent pneumonia/ pneumonitis. Mildly enlarged mediastinal lymph nodes possibly reactive. Pneumoperitoneum as previously described likely related to postsurgical status. Electronically Signed   By: RSkipper ClicheM.D.   On: 05/09/2015 16:59   Ct Abdomen Pelvis W Contrast  04/30/2015  CLINICAL DATA:  Incomplete colonoscopy. EXAM: CT ABDOMEN AND PELVIS WITH CONTRAST TECHNIQUE: Multidetector CT imaging of the abdomen and pelvis was performed using the standard protocol following bolus administration of intravenous contrast. CONTRAST:  1025mOMNIPAQUE IOHEXOL 300 MG/ML  SOLN COMPARISON:  None. FINDINGS: Lower chest: Chronic appearing pleural thickening with calcification overlies the posterior left lung base. Overlying scar versus atelectasis noted. 3 mm right middle lobe lung nodule is identified, image number 3 of series 6. Hepatobiliary: Mild diffuse hepatic steatosis. The gallbladder appears normal. No biliary dilatation. Pancreas: Normal appearance of the pancreas. Spleen: The spleen is unremarkable. Adrenals/Urinary Tract: The adrenal glands are normal. Bilateral renal cysts are identified. The urinary bladder appears normal. Stomach/Bowel: The stomach appears normal. Small bowel loops have a normal course and caliber. There is no evidence for a bowel obstruction. Circumferential mass involving the pelvic small bowel loop is identified. This measures 5.3 x 3.9 x 4.3 cm and results in moderate narrowing of the small bowel lumen, image 61 of series 2 and image 62 of series 3. There is a large left inguinal hernia which contains a loop of sigmoid colon. Sigmoid colon diverticula noted. Vascular/Lymphatic: Calcified atherosclerotic disease involves the abdominal aorta. No aneurysm. Enlarged lymph node within the lower abdominal ileocolic mesentery measures 1.4 cm, image 50 of series 2. Reproductive:  Mild prostate gland enlargement. Other: No free fluid or fluid collections identified. No peritoneal nodule or mass. Musculoskeletal: Degenerative disc disease noted within the lumbar spine. No aggressive lytic or sclerotic bone lesions. IMPRESSION: 1. Suspicious mass involving a loop of distal small bowel, likely ileum is identified and worrisome for small bowel neoplasm. This does not resolve then any significant small bowel obstruction. Surgical consultation suggested. 2. Enlarged  ileocolic lymph node within the lower abdomen may represent a focus of metastatic adenopathy. 3. Left inguinal hernia contains a loop of sigmoid colon which may account for difficulty with colonoscopy. 4. Aortic atherosclerosis. 5. Hepatic steatosis. Electronically Signed   By: Kerby Moors M.D.   On: 04/30/2015 12:12   US Venous Img Lower Bilateral  05/05/2015  CLINICAL DATA:  Bilateral lower extremity pain EXAM: BILATERAL LOWER EXTREMITY VENOUS DUPLEX ULTRASOUND TECHNIQUE: Gray-scale sonography with graded compression, as well as color Doppler and duplex ultrasound were performed to evaluate the lower extremity deep venous systems from the level of the common femoral vein and including the common femoral, femoral, profunda femoral, popliteal and calf veins including the posterior tibial, peroneal and gastrocnemius veins when visible. The superficial great saphenous vein was also interrogated. Spectral Doppler was utilized to evaluate flow at rest and with distal augmentation maneuvers in the common femoral, femoral and popliteal veins. COMPARISON:  None. FINDINGS: RIGHT LOWER EXTREMITY Common Femoral Vein: No evidence of thrombus. Normal compressibility, respiratory phasicity and response to augmentation. Saphenofemoral Junction: No evidence of thrombus. Normal compressibility and flow on color Doppler imaging. Profunda Femoral Vein: No evidence of thrombus. Normal compressibility and flow on color Doppler imaging. Femoral Vein:  No evidence of thrombus. Normal compressibility, respiratory phasicity and response to augmentation. Popliteal Vein: No evidence of thrombus. Normal compressibility, respiratory phasicity and response to augmentation. Calf Veins: No evidence of thrombus. Normal compressibility and flow on color Doppler imaging. Superficial Great Saphenous Vein: No evidence of thrombus. Normal compressibility and flow on color Doppler imaging. Venous Reflux:  None. Other Findings:  None. LEFT LOWER EXTREMITY Common Femoral Vein: No evidence of thrombus. Normal compressibility, respiratory phasicity and response to augmentation. Saphenofemoral Junction: No evidence of thrombus. Normal compressibility and flow on color Doppler imaging. Profunda Femoral Vein: No evidence of thrombus. Normal compressibility and flow on color Doppler imaging. Femoral Vein: No evidence of thrombus. Normal compressibility, respiratory phasicity and response to augmentation. Popliteal Vein: No evidence of thrombus. Normal compressibility, respiratory phasicity and response to augmentation. Calf Veins: No evidence of thrombus. Normal compressibility and flow on color Doppler imaging. Superficial Great Saphenous Vein: No evidence of thrombus. Normal compressibility and flow on color Doppler imaging. Venous Reflux:  None. Other Findings:  None. IMPRESSION: No evidence of deep venous thrombosis in either lower extremity. Electronically Signed   By: Lowella Grip III M.D.   On: 05/05/2015 10:33   Dg Chest Port 1 View  05/06/2015  CLINICAL DATA:  Shortness of breath. Leukocytosis. Recent abdominal surgery. EXAM: PORTABLE CHEST 1 VIEW COMPARISON:  04/04/2015. FINDINGS: Prior CABG. Heart size stable. Dense left lung diffuse infiltrate noted consistent with pneumonia. Prominent areas of bilateral subsegmental atelectasis. Free intraperitoneal air, most likely from recent abdominal surgery . Scratched Clinical correlation is suggested. Abdominal series can be  obtained for further evaluation. IMPRESSION: 1. Free intraperitoneal air, most likely from recent abdominal surgery. Clinical correlation suggested. Abdominal series can be obtained for further evaluation . 2. Diffuse dense left lung infiltrate consistent pneumonia. Multifocal bilateral subsegmental atelectasis . Critical Value/emergent results were called by telephone at the time of interpretation on 05/06/2015 at 8:41 am to nurse Anderson Malta, who verbally acknowledged these results. Electronically Signed   By: Marcello Moores  Register   On: 05/06/2015 08:43    Microbiology: Recent Results (from the past 240 hour(s))  MRSA PCR Screening     Status: None   Collection Time: 05/02/15 11:45 AM  Result Value Ref Range Status   MRSA  by PCR NEGATIVE NEGATIVE Final    Comment:        The GeneXpert MRSA Assay (FDA approved for NASAL specimens only), is one component of a comprehensive MRSA colonization surveillance program. It is not intended to diagnose MRSA infection nor to guide or monitor treatment for MRSA infections.      Labs: Basic Metabolic Panel:  Recent Labs Lab 05/05/15 0443 05/06/15 0440 05/07/15 0554 05/08/15 0602 05/10/15 0616  NA 131* 133* 136 138 137  K 4.2 4.6 4.2 4.1 3.8  CL 101 102 106 106 101  CO2 _0 GLUCOSE 124* 133* 131* 112* 137*  BUN _1 CREATININE 1.16 1.33* 1.28* 1.25* 1.20  CALCIUM 8.0* 8.3* 7.8* 8.2* 8.4*  MG 1.8 1.7  --   --   --   PHOS 2.4* 2.8  --   --   --     CBC:  Recent Labs Lab 05/05/15 0443 05/06/15 0440 05/07/15 0554 05/08/15 0602 05/10/15 0616  WBC 10.8* 16.4* 11.7* 8.2 6.5  HGB 10.5* 10.1* 8.7* 9.2* 8.7*  HCT 32.4* 31.5* 27.1* 28.8* 27.7*  MCV 87.1 87.5 88.3 88.6 87.7  PLT 199 218 203 210 243    BNP: BNP (last 3 results)  Recent Labs  02/22/15 1446  BNP 84.0    ProBNP (last 3 results) No results for input(s): PROBNP in the last 8760 hours.  CBG:  Recent Labs Lab 05/10/15 1144 05/10/15 1634  05/10/15 2050 05/11/15 0801 05/11/15 1118  GLUCAP 137* 106* 105* 107* 157*       Signed:  Shanisha Lech. MD  [facility] 05/11/2015, 11:45 AM    By signing my name below, I, Rosalie Doctor, attest that this documentation has been prepared under the direction and in the presence of [facility]. MD Electronically Signed: Rosalie Doctor, Scribe. 05/11/2015  I, Dr. Kathie Dike, personally performed the services described in this documentaiton. All medical record entries made by the scribe were at my direction and in my presence. I have reviewed the chart and agree that the record reflects my personal performance and is accurate and complete  Kathie Dike, MD, 05/11/2015 11:45 AM

## 2015-05-11 NOTE — Progress Notes (Signed)
Discussed with the family the patients plan to be discharged to the [facility] today.  Notified Holly the SW on call so that we can began the process of discharge/transfer.  Earnest Bailey stated that she she would notify the [facility] to prepare them for the transfer.  Earnest Bailey called me back to tell me that according to the staff there they did not know that the Patient was coming and would not except the patient.  She states that she has discussed the situation with her supervisor and her supervisor would discuss with the [facility] supervisor.  She states that if she is unable to get the facility to accept, then she would have to search for more bed offers.  I notified Dr. Roderic Palau of the situation and he said to re-notify the  [facility] and make sure that she is aware that there is documentation that the patient is to come over to the facility.  I re-notified Earnest Bailey about the notes and she stated that she would let me know what happens with the supervisor conversations and otherwise she would talk to the patients daughters about finding another bed.  I will await for the call.

## 2015-05-12 ENCOUNTER — Inpatient Hospital Stay
Admission: RE | Admit: 2015-05-12 | Discharge: 2015-05-13 | Disposition: A | Discharge status: Home / Self Care | Payer: Medicare Other | Source: Ambulatory Visit | Attending: Internal Medicine | Admitting: Internal Medicine

## 2015-05-12 DIAGNOSIS — R52 Pain, unspecified: Principal | ICD-10-CM

## 2015-05-12 LAB — GLUCOSE, CAPILLARY
GLUCOSE-CAPILLARY: 134 mg/dL — AB (ref 65–99)
Glucose-Capillary: 117 mg/dL — ABNORMAL HIGH (ref 65–99)

## 2015-05-12 NOTE — Progress Notes (Signed)
[facility] PROGRESS NOTE  [name] [name] NID:[id number] DOB: 1930-10-27 DOA: 04/27/2015 PCP: [name], MD  Assessment/Plan: 1. Distal small bowel mass revealed on CT A/P. General surgery performed ExLap 12/9 with partial small bowel resection. 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 CT chest did not reveal any mass. He will follow up with oncology as an outpatient. Continue nectar-thick liquids per ST. Palliative care met with family to establish goals of care. Code status changed to DNR.  He will be transferred to [facility].  1. Symptomatic blood loss anemia, secondary to small bowel mass. Stable s/p transfusion of 4U PRBCs. No evidence of ongoing bleeding.  2. HCAP/ Aspiration PNA. CXR revealed dense left lung opacity. Patient treated with IV abx and transitioned to oral abx prior to discharge. He remained afebrile with a normal WBC. ST consulted and recommended a nectar-thick liquid diet. He will need a repeat CXR in 3-4 weeks and continued ST follow up.  3. Sepsis secondary to #3. WBC wnl, Afebrile. BP remained stable. Continue oral abx upon discharge. 4. Tachycardia, improved however heart sounds were irregular. EKG revealed sinus tachycardia with PACs. Bilateral US negative for DVT. He has been started on metoprolol with better rate control. This can be further titrated as an outpatient as his BP tolerates. 5. Back pain secondary to lumbar compression fx. He has been started on a Lidocaine patch as well as Tramadol with improvement of symptoms. He will need physical therapy.  6. DM type 2, stable. Continue home management. 7. BPH, stable. Continue Flomax and Finasteride.  8. Essential HTN, stable. Continue home management. 9. Hyponatremia, repleted. 10. Hypokalemia with hypomagnesemia, repleted.  11. Anorexia, felt to be related to depression/underlying malignancy. Started on Remeron for depression as well as appetite  stimulant.  12. CKD stage III, appears to be at baseline.  Code Status: DNR DVT prophylaxis: Lovenox Family Communication: Discussed with patient who understands and has no concerns at this time. Disposition Plan: Discharge to SNF today   Consultants:  Surgery  GI  Cardiology  Procedures:  Transfusion 4U PRBCs.   ExLap 12/9 with partial small bowel resection  Antibiotics:  Cipro 12/9>>12/9  Flagy 12/9>>12/9  Zosyn  12/12>>12/15  Vancomycin 12/12>>12/15  Augmentin 12/15>>  HPI/Subjective: Feels okay.  Back pain is better, no shortness of breath  Objective: Filed Vitals:   05/11/15 1400 05/11/15 2014  BP: 96/45 115/78  Pulse: 87 107  Temp: 98 F (36.7 C) 97.7 F (36.5 C)  Resp: 18 17   No intake or output data in the 24 hours ending 05/12/15 1046 Filed Weights   05/05/15 0500 05/06/15 0500 05/11/15 0650  Weight: 59.3 kg (130 lb 11.7 oz) 60.3 kg (132 lb 15 oz) 58.333 kg (128 lb 9.6 oz)    Exam:  General: NAD, looks comfortable Cardiovascular: Regular heart sounds with occasional PACs  Respiratory: Coarse breath sounds bilaterally, No wheezing  Abdomen: soft,  Non tender, no distention , bowel sounds normal Musculoskeletal: No edema b/l   Data Reviewed: Basic Metabolic Panel:  Recent Labs Lab 05/06/15 0440 05/07/15 0554 05/08/15 0602 05/10/15 0616  NA 133* 136 138 137  K 4.6 4.2 4.1 3.8  CL 102 106 106 101  CO2 _0 GLUCOSE 133* 131* 112* 137*  BUN _1 CREATININE 1.33* 1.28* 1.25* 1.20  CALCIUM 8.3* 7.8* 8.2* 8.4*  MG 1.7  --   --   --   PHOS 2.8  --   --   --  CBC:  Recent Labs Lab 05/06/15 0440 05/07/15 0554 05/08/15 0602 05/10/15 0616  WBC 16.4* 11.7* 8.2 6.5  HGB 10.1* 8.7* 9.2* 8.7*  HCT 31.5* 27.1* 28.8* 27.7*  MCV 87.5 88.3 88.6 87.7  PLT 218 203 210 243    BNP (last 3 results)  Recent Labs  02/22/15 1446  BNP 84.0     CBG:  Recent Labs Lab 05/11/15 0801 05/11/15 1118 05/11/15 1646  05/11/15 2017 05/12/15 0801  GLUCAP 107* 157* 80 130* 117*    Recent Results (from the past 240 hour(s))  MRSA PCR Screening     Status: None   Collection Time: 05/02/15 11:45 AM  Result Value Ref Range Status   MRSA by PCR NEGATIVE NEGATIVE Final    Comment:        The GeneXpert MRSA Assay (FDA approved for NASAL specimens only), is one component of a comprehensive MRSA colonization surveillance program. It is not intended to diagnose MRSA infection nor to guide or monitor treatment for MRSA infections.      Studies: No results found.  Scheduled Meds: . amoxicillin-clavulanate  1 tablet Oral Q12H  . antiseptic oral rinse  7 mL Mouth Rinse BID  . clonazePAM  1 mg Oral QHS  . docusate sodium  100 mg Oral BID  . enoxaparin (LOVENOX) injection  40 mg Subcutaneous Q24H  . feeding supplement (ENSURE ENLIVE)  237 mL Oral BID BM  . feeding supplement (PRO-STAT SUGAR FREE 64)  30 mL Oral BID  . guaiFENesin  1,200 mg Oral BID  . Influenza vac split quadrivalent PF  0.5 mL Intramuscular Tomorrow-1000  . insulin aspart  0-15 Units Subcutaneous TID WC  . lidocaine  1 patch Transdermal Q24H  . metoprolol tartrate  25 mg Oral BID  . mirtazapine  15 mg Oral QHS  . pantoprazole  40 mg Oral Daily  . pneumococcal 23 valent vaccine  0.5 mL Intramuscular Tomorrow-1000  . sodium chloride  3 mL Intravenous Q12H  . tamsulosin  0.4 mg Oral Daily  . traMADol  50 mg Oral Q12H   Continuous Infusions:    Active Problems:   Diabetes mellitus, type 2 ([place])   Essential hypertension, benign   Symptomatic anemia   Acute renal failure superimposed on stage 3 chronic kidney disease (HCC)   Dehydration   Fecal impaction in rectum (HCC)   Constipation   Falls   Lumbar compression fracture (HCC)   Hyperkalemia   Hyponatremia   BPH (benign prostatic hypertrophy)   Protein-calorie malnutrition, severe   Small bowel mass   Unilateral recurrent inguinal hernia without obstruction or gangrene    Abnormality of rectum   Palliative care encounter   DNR (do not resuscitate) discussion    Time spent: 20 minutes  Jehanzeb Memon. MD [facility] Pager [phone]. If 7PM-7AM, please contact night-coverage at [email or web address], password [facility] 05/12/2015, 10:46 AM  LOS: 15 days     By signing my name below, I, Rosalie Doctor, attest that this documentation has been prepared under the direction and in the presence of [employer]. MD Electronically Signed: Rosalie Doctor, Scribe. 05/12/2015      I, Dr. Kathie Dike, personally performed the services described in this documentaiton. All medical record entries made by the scribe were at my direction and in my presence. I have reviewed the chart and agree that the record reflects my personal performance and is accurate and complete  Kathie Dike, MD, 05/12/2015 10:49 AM

## 2015-05-12 NOTE — Progress Notes (Signed)
Removing telemetry per new order received.

## 2015-05-12 NOTE — Progress Notes (Signed)
Per Darnelle Catalan, RN Supervisor at [facility], facility can accept Pt today and asked that Pt come after lunch.  RN, Elwin Sleight, made aware.  Tamika to call [facility] S[facility] to coordinate d/c.  SW notified Pt's daughters, Rosanne Ashing and Thayer Headings, and updated her on the situation.  They voiced an understanding that Pt will be d/c'd today after 2.  Family was pleased.  SW sent Pt's information to [facility] via the [place].  No additional SW needs.  Pt to be d/c'd.  Bernita Raisin, [facility] Social Work [phone]

## 2015-05-12 NOTE — Progress Notes (Signed)
Informed by RN supervisor at [facility] that Pt will have a private room.  Left a voicemail for Pt's daughter, Mrs. Shea Evans, with this information.  Bernita Raisin, [place] Social Work [phone]

## 2015-05-12 NOTE — Progress Notes (Signed)
Staples removed. Steri-Strips in place. Abdomen benign. Will sign off.

## 2015-05-12 NOTE — Progress Notes (Signed)
Late Entry 05/11/2015 approx 1800  \Lynne Rierson notified me from the [facility] center about the patient being transferred to [facility].  She states that according to the social worker for their facility our LCW notified them that the patient was wanting to come there but they did not know if the patient would be ready for discharge over the weekend or Monday.  She goes on to say that our LSW was suppose to call [facility] LSW back with an definite answer of when he would come, but did not.  So therefore, they did not know anything about the admission.  She states that the patients insurance has not even been authorized but, after speaking with her administrator Nita Sickle it was okay to come.  This is even if the insurance had not been verified yet.  Sula Soda said that the patient could not come that night but to call in the am and plan to discharge there in the am.  I verbalized understanding and the family, house supervisor Ethelene Hal, and Dr. Roderic Palau was updated.    Late Entry 05/12/15 apprx. 0930   Discussed with Winfred Burn from and voiced to her what [facility] had said.  She notified stated that she would call [facility] S[facility] and set up transfer. Estill Bamberg called me back and stated that the patient was ready to go but could not leave until after 2 pm.  I verbalized understanding and updated the MD and the patients family.    Report was called [facility] Soda at 1425 and she verbalized understanding and voiced no further questions or concerns at this time.  Patient was transferred over via w/c with staff, family, and belongings in stable condition.

## 2015-05-13 ENCOUNTER — Other Ambulatory Visit: Payer: Self-pay | Admitting: *Deleted

## 2015-05-13 ENCOUNTER — Non-Acute Institutional Stay (SKILLED_NURSING_FACILITY): Payer: Medicare Other | Admitting: Internal Medicine

## 2015-05-13 ENCOUNTER — Ambulatory Visit (HOSPITAL_COMMUNITY): Payer: Medicare Other | Attending: Internal Medicine

## 2015-05-13 DIAGNOSIS — M7989 Other specified soft tissue disorders: Secondary | ICD-10-CM | POA: Diagnosis not present

## 2015-05-13 DIAGNOSIS — M11272 Other chondrocalcinosis, left ankle and foot: Secondary | ICD-10-CM

## 2015-05-13 DIAGNOSIS — K922 Gastrointestinal hemorrhage, unspecified: Secondary | ICD-10-CM

## 2015-05-13 DIAGNOSIS — J189 Pneumonia, unspecified organism: Secondary | ICD-10-CM | POA: Diagnosis not present

## 2015-05-13 DIAGNOSIS — M79672 Pain in left foot: Secondary | ICD-10-CM | POA: Diagnosis not present

## 2015-05-13 DIAGNOSIS — R410 Disorientation, unspecified: Secondary | ICD-10-CM | POA: Diagnosis not present

## 2015-05-13 DIAGNOSIS — Y95 Nosocomial condition: Secondary | ICD-10-CM

## 2015-05-13 DIAGNOSIS — M11872 Other specified crystal arthropathies, left ankle and foot: Secondary | ICD-10-CM | POA: Diagnosis not present

## 2015-05-13 DIAGNOSIS — D379 Neoplasm of uncertain behavior of digestive organ, unspecified: Secondary | ICD-10-CM

## 2015-05-13 DIAGNOSIS — D490 Neoplasm of unspecified behavior of digestive system: Secondary | ICD-10-CM

## 2015-05-13 MED ORDER — CLONAZEPAM 0.5 MG PO TABS: 1.0000 mg | ORAL_TABLET | Freq: Every day | ORAL | Status: DC

## 2015-05-13 MED ORDER — CLONAZEPAM 0.5 MG PO TABS: ORAL_TABLET | ORAL | Status: DC

## 2015-05-13 MED ORDER — TRAMADOL HCL 50 MG PO TABS: 50.0000 mg | ORAL_TABLET | Freq: Four times a day (QID) | ORAL | Status: DC | PRN

## 2015-05-13 NOTE — Telephone Encounter (Signed)
[facility] 

## 2015-05-13 NOTE — Progress Notes (Signed)
Patient ID: [name] [name], male   DOB: Oct 19, 1930, 81 y.o.   MRN: [id number] Facility; [facility] Chief complaint; admission to skilled care post stay at [facility] 12/3 through 12/17 History; this is a 81 year old man who spent a complicated stay at hospital. He had been previously noted that to have symptomatic iron deficiency anemia presenting in November with a hemoglobin of 5.3 requiring a transfusion of 2 units of. He did initially did well however he started having difficulties about a week later. Repeat presenting with stool with bright red blood the. Weight lossHis presenting hemoglobin was 6.6 creatinine 2.10 he was transfused 2 units and admitted. 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 cT scan of the chest was negative. At some point he met with palliative care and he was to be a DO NOT RESUSCITATE.I think he was also treated for pneumonia in the left lower lobe.  The patient was transfused 4 units and total. He is now complaining of lefand low back pain. His low back pain was felt to be secondary to compression fractures. He was discharged on Augmentinto complete treatment for a left lower lobe pneumonia.  CMP Latest Ref Rng 05/10/2015 05/08/2015 05/07/2015  Glucose 65 - 99 mg/dL 137(H) 112(H) 131(H)  BUN 6 - 20 mg/dL '13 15 17  ' Creatinine 0.61 - 1.24 mg/dL 1.20 1.25(H) 1.28(H)  Sodium 135 - 145 mmol/L 137 138 136  Potassium 3.5 - 5.1 mmol/L 3.8 4.1 4.2  Chloride 101 - 111 mmol/L 101 106 106  CO2 22 - 32 mmol/L '31 26 24  ' Calcium 8.9 - 10.3 mg/dL 8.4(L) 8.2(L) 7.8(L)  Total Protein 6.5 - 8.1 g/dL - - -  Total Bilirubin 0.3 - 1.2 mg/dL - - -  Alkaline Phos 38 - 126 U/L - - -  AST 15 - 41 U/L - - -  ALT 17 - 63 U/L - - -   CBC Latest Ref Rng 05/10/2015 05/08/2015 05/07/2015  WBC 4.0 - 10.5 K/uL 6.5 8.2 11.7(H)   Hemoglobin 13.0 - 17.0 g/dL 8.7(L) 9.2(L) 8.7(L)  Hematocrit 39.0 - 52.0 % 27.7(L) 28.8(L) 27.1(L)  Platelets 150 - 400 K/uL 243 210 203    Past Medical History  Diagnosis Date  . Diabetes mellitus, type 2 ([place])   . Essential hypertension, benign   . Coronary atherosclerosis of native coronary artery 1992    Multivessel s/p CABG 1992  . Hyperlipidemia   . TIA (transient ischemic attack)   . History of pneumonia   . Benign prostatic hypertrophy   . Chronic anemia     Past Surgical History  Procedure Laterality Date  . Coronary artery bypass graft  1992  . Tonsillectomy    . Hernia repair    . Cataract extraction Bilateral   . Colonoscopy N/A 02/24/2015    INCOMPLETE DUE TO REDUNDANT LEFT COLON  . Esophagogastroduodenoscopy N/A 02/24/2015    ATROPHIC GASTRITIS, DUODENITIS  . Colonoscopy N/A 04/29/2015    Procedure: COLONOSCOPY;  Surgeon: Daneil Dolin, MD;  Location: [facility] ENDO SUITE;  Service: Endoscopy;  Laterality: N/A;  . Bowel resection N/A 05/03/2015    Procedure:  PARTIAL SMALL BOWEL RESECTION;  Surgeon: Aviva Signs, MD;  Location: [facility] ORS;  Service: General;  Laterality: N/A;    Current Outpatient Prescriptions on File Prior to Visit  Medication Sig Dispense Refill  . acetaminophen (TYLENOL) 650 MG CR tablet  Take 650 mg by mouth every 8 (eight) hours as needed for pain.    Marland Kitchen albuterol (PROVENTIL HFA;VENTOLIN HFA) 108 (90 BASE) MCG/ACT inhaler Inhale 2 puffs into the lungs every 6 (six) hours as needed for wheezing or shortness of breath.    Marland Kitchen amoxicillin-clavulanate (AUGMENTIN) 875-125 MG tablet Take 1 tablet by mouth every 12 (twelve) hours. 18 tablet 0  . Cholecalciferol (VITAMIN D PO) Take 1 capsule by mouth daily.    . clonazePAM (KLONOPIN) 0.5 MG tablet Take 1 mg by mouth at bedtime.   0  . Docusate Sodium (RA COL-RITE PO) Take 2 capsules by mouth at bedtime.    . ferrous sulfate 325 (65 FE) MG tablet Take 1 tablet (325 mg total) by mouth daily with breakfast. 30  tablet 0  . finasteride (PROSCAR) 5 MG tablet Take 1 tablet by mouth daily.  0  . fluconazole (DIFLUCAN) 100 MG tablet Take 1 tablet (100 mg total) by mouth daily. 14 tablet 0  . lidocaine (LIDODERM) 5 % Place 1 patch onto the skin daily. Remove & Discard patch within 12 hours or as directed by MD 30 patch 0  . magic mouthwash w/lidocaine SOLN Take 5 mLs by mouth 3 (three) times daily.  0  . Magnesium 200 MG TABS Take 1 tablet by mouth daily.    . metFORMIN (GLUCOPHAGE) 500 MG tablet Take 500 mg by mouth 2 (two) times daily with a meal.     . metoprolol tartrate (LOPRESSOR) 25 MG tablet Take 1 tablet (25 mg total) by mouth 2 (two) times daily.    . mirtazapine (REMERON) 15 MG tablet Take 1 tablet (15 mg total) by mouth at bedtime.    . Multiple Vitamins-Minerals (ONE-A-DAY 50 PLUS PO) Take 1 tablet by mouth daily.    . Omega-3 Fatty Acids (FISH OIL) 1200 MG CAPS Take 1 capsule by mouth daily.    . pantoprazole (PROTONIX) 40 MG tablet Take 1 tablet (40 mg total) by mouth daily before breakfast. 30 tablet 1  . Tamsulosin HCl (FLOMAX) 0.4 MG CAPS Take 0.4 mg by mouth daily.     . traMADol (ULTRAM) 50 MG tablet Take 1 tablet (50 mg total) by mouth every 6 (six) hours as needed for moderate pain. 30 tablet 0  . vitamin B-12 (CYANOCOBALAMIN) 1000 MCG tablet Take 1,000 mcg by mouth daily.     Social history;the patient lives with his wife. They have a caregiverwho comes in who lives nearby.I am not certain exactly of the patient's functional status  reports that he quit smoking about 20 years ago. His smoking use included Cigarettes. He started smoking about 76 years ago. He quit after 40 years of use. He does not have any smokeless tobacco history on file. He reports that he does not drink alcohol or use illicit drugs.  Family History  Problem Relation Age of Onset  . Cancer Father   . Cancer Mother     Review of systems; the patient really doesn't talk that much. His wife and caregiver did most  of the review I therefore don't have a good senseof what the patient is experiencing. They state he has a poor appetite. Facility report that he was very confused last night wanting to go home Klonopin was not effective. He finally fell asleep at 5:30 in the morning.  Physical examination Gen. Patient does not look to be in any distress. HEENT mucous membranes are moist Lymph; nonpalpablein the cervical clavicular axillary or inguinal areas Respiratory;  decreased air entry with crackles in the left lower lobe. No wheezing Cardiac; heart sounds are normal no murmurs no carotid bruits Abdomen; surgical scar is well approximated, not distended,  no liver. He has a nonreducible left direct inguinal hernia which is tender GU; left scrotum is normal bladder is not distended Extremities; no edema peripheral pulses are palpable Musculoskeletal; intensely tender left metatarsal phalangeal joint #1. This has the appearance of either gout or pseudogout. Pseudogout would probably be most likely in this setting Neurologic; I see no lateralizing signs he is able to move all his limbs. Mental status; said the year was 2005 in that he was 60 or. 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 From the tone of the pathology report I think this is felt to be metastatic. It was suggested that he see oncology he required 4 units of packed red blood cells without evidence of ongoing bleeding.is discharge hemoglobin was 8.7 #2 the patient was seen by palliative care and he is a DO NOT RESUSCITATE. I must say he looked better than I thought he was going to #3 ongoing delirium. I am uncertain about whether there is background dementia here not t the cause of the delirium is not clear. He clonazepam probably should stop in this setting #4 left lower lobe pneumonia now on Augmentin. This certainly fits with  physical exam. He probably should have a repeat x-ray in one month #5 history of BPH on Flomax and Proscar there is no evidence currently of urinary retention #6 left direct inguinal hernia this does not reduce however he appears to be otherwise asymptomatic. #7probable pseudogout in the left first metatarsophalangeal joint Excluded at fracture #8discharged on Diflucan not exactly certain the reason here. #9low back pain according to the family. He has a Lidoderm patch year not exactly sure of the etiology. #10ype 2 diabetes on metformin I do not have his CBG

## 2015-05-16 ENCOUNTER — Telehealth (HOSPITAL_COMMUNITY): Payer: Self-pay | Admitting: Lab

## 2015-05-16 ENCOUNTER — Other Ambulatory Visit (HOSPITAL_COMMUNITY): Payer: Self-pay | Admitting: Oncology

## 2015-05-16 ENCOUNTER — Telehealth: Payer: Self-pay

## 2015-05-16 DIAGNOSIS — K6389 Other specified diseases of intestine: Secondary | ICD-10-CM

## 2015-05-16 MED ORDER — LIDOCAINE 5 % EX PTCH: 1.0000 | MEDICATED_PATCH | CUTANEOUS | Status: DC

## 2015-05-16 MED ORDER — OXYCODONE-ACETAMINOPHEN 5-325 MG PO TABS: 1.0000 | ORAL_TABLET | ORAL | Status: AC | PRN

## 2015-05-16 NOTE — Telephone Encounter (Signed)
pts daughter called- Townsend Roger, and left voicemail- she said her father was recently discharged from [facility] and is now at the [facility]. He recently had surgery and the [facility] does not have any pain medications for him and he is in a lot of pain. He has his first visit with oncology on 05/24/15. She would like him to have something for pain. Her number is [phone].

## 2015-05-16 NOTE — Telephone Encounter (Signed)
I spoke to Dr Oneida Alar and she said to tell them to see Dr.at the [facility] for the pain meds. I called daughter and left the message on machine.

## 2015-05-16 NOTE — Telephone Encounter (Signed)
Forwarding to Dr.Fields.  

## 2015-05-20 ENCOUNTER — Encounter (HOSPITAL_COMMUNITY)
Admission: RE | Admit: 2015-05-20 | Payer: Medicare Other | Source: Skilled Nursing Facility | Admitting: Internal Medicine

## 2015-05-22 ENCOUNTER — Other Ambulatory Visit: Payer: Self-pay | Admitting: Internal Medicine

## 2015-05-23 ENCOUNTER — Other Ambulatory Visit: Payer: Self-pay | Admitting: *Deleted

## 2015-05-23 NOTE — Patient Outreach (Signed)
[facility]) Care Management  05/23/2015  WENDLE GENOVA 10/19/1930 [id number]   MD referral: contact person is daughter-T. Forrest. Telephone call to contact-left message on voice mail requesting call back.  Plan: will follow up. Sherrin Daisy, RN BSN [place] Management Coordinator [facility] Care Management  [phone]

## 2015-05-24 ENCOUNTER — Encounter (HOSPITAL_COMMUNITY): Payer: Medicare Other | Attending: Hematology & Oncology | Admitting: Hematology & Oncology

## 2015-05-24 ENCOUNTER — Ambulatory Visit (HOSPITAL_COMMUNITY)
Admission: RE | Admit: 2015-05-24 | Discharge: 2015-05-24 | Disposition: A | Discharge status: Home / Self Care | Payer: Medicare Other | Source: Ambulatory Visit | Attending: Hematology & Oncology | Admitting: Hematology & Oncology

## 2015-05-24 ENCOUNTER — Other Ambulatory Visit: Payer: Self-pay | Admitting: *Deleted

## 2015-05-24 ENCOUNTER — Encounter (HOSPITAL_COMMUNITY): Payer: Self-pay | Admitting: Lab

## 2015-05-24 ENCOUNTER — Encounter (HOSPITAL_COMMUNITY): Payer: Self-pay | Admitting: Hematology & Oncology

## 2015-05-24 VITALS — BP 100/64 | HR 105 | Temp 97.7°F | Resp 22 | Ht 64.0 in | Wt 114.6 lb

## 2015-05-24 DIAGNOSIS — C179 Malignant neoplasm of small intestine, unspecified: Secondary | ICD-10-CM | POA: Diagnosis present

## 2015-05-24 DIAGNOSIS — R410 Disorientation, unspecified: Secondary | ICD-10-CM

## 2015-05-24 DIAGNOSIS — J189 Pneumonia, unspecified organism: Secondary | ICD-10-CM | POA: Insufficient documentation

## 2015-05-24 DIAGNOSIS — J181 Lobar pneumonia, unspecified organism: Secondary | ICD-10-CM

## 2015-05-24 DIAGNOSIS — N19 Unspecified kidney failure: Secondary | ICD-10-CM | POA: Diagnosis not present

## 2015-05-24 DIAGNOSIS — R634 Abnormal weight loss: Secondary | ICD-10-CM

## 2015-05-24 MED ORDER — LIDOCAINE 5 % EX PTCH
1.0000 | MEDICATED_PATCH | CUTANEOUS | Status: AC
Start: 2015-05-24 — End: ?

## 2015-05-24 MED ORDER — MEGESTROL ACETATE 400 MG/10ML PO SUSP: 400.0000 mg | Freq: Every day | ORAL | Status: AC

## 2015-05-24 MED ORDER — HYDROCODONE-ACETAMINOPHEN 5-325 MG PO TABS: 1.0000 | ORAL_TABLET | ORAL | Status: AC | PRN

## 2015-05-24 MED ORDER — TRAMADOL HCL 50 MG PO TABS: 50.0000 mg | ORAL_TABLET | Freq: Four times a day (QID) | ORAL | Status: AC | PRN

## 2015-05-24 MED ORDER — CLONAZEPAM 0.5 MG PO TABS: ORAL_TABLET | ORAL | Status: AC

## 2015-05-24 NOTE — Progress Notes (Signed)
Referral made to [facility].  Records faxed on 12/30

## 2015-05-24 NOTE — Patient Instructions (Signed)
[place] at [facility] Discharge Instructions  RECOMMENDATIONS MADE BY THE CONSULTANT AND ANY TEST RESULTS WILL BE SENT TO YOUR REFERRING PHYSICIAN.   Exam completed by Dr Whitney Muse today Some of your back pain is coming from a compression fracture in your back.  Lidoderm refill sent to your pharmacy  Chest x-ray before you leave, just stop by radiology.   Hospice to call and talk to you  Hydrocodone pain medication Tramadol pain medication Please call the clinic if you have any questions or concerns     Thank you for choosing [place] [facility] to provide your oncology and hematology care.  To afford each patient quality time with our provider, please arrive at least 15 minutes before your scheduled appointment time.    You need to re-schedule your appointment should you arrive 10 or more minutes late.  We strive to give you quality time with our providers, and arriving late affects you and other patients whose appointments are after yours.  Also, if you no show three or more times for appointments you may be dismissed from the clinic at the providers discretion.     Again, thank you for choosing [facility].  Our hope is that these requests will decrease the amount of time that you wait before being seen by our physicians.       _____________________________________________________________  Should you have questions after your visit to [facility], please contact our office at [phone] between the hours of 8:30 a.m. and 4:30 p.m.  Voicemails left after 4:30 p.m. will not be returned until the following business day.  For prescription refill requests, have your pharmacy contact our office.

## 2015-05-24 NOTE — Patient Outreach (Signed)
[facility] Care Management  05/24/2015  [name] [name] 19-Dec-1930 [id number]  Telephone call to patient's daughter in response to voice mail; left message on voice mail requesting call back.  Plan: will follow up.  Sherrin Daisy, RN BSN [place] Management Coordinator [facility] Care Management  [phone]

## 2015-05-24 NOTE — Progress Notes (Signed)
[facility] [place] NOTE  Patient Care Team: Celene Squibb, MD as PCP - General (Internal Medicine) Yehuda Savannah, MD (Cardiology) Dickie La, RN as [place] Management  CHIEF COMPLAINTS/PURPOSE OF CONSULTATION:  Exploratory laparotomy, partial bowel resection on 05/03/15 with Dr. Arnoldo Morale with pathology showing a poorly differentiated carcinoma, 0/5 lymph nodes.  CT abdomen/pelvis 04/30/15 with mass involving distal small bowel, enlarged ileocolic lymph node, hepatic steatosis Hospital admission 04/27/15 through 05/11/15 secondary to severe protein calorie malnutrition, acute on chronic renal failure, symptomatic blood loss anemia secondary to rectal bleeding from small bowel mass, pneumonia, sepsis  Total hospital admissions of four secondary to GI bleed Anemia  HISTORY OF PRESENTING ILLNESS:  [name] [name] 81 y.o. 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 He has failure to thrive. In spite of recent surgery, he continues to not do well.  Mr. Blowers is accompanied by his daughter, [name] [name], today. Nasal cannula in place. They do not have oxygen at home.  He was living at home before his surgery with Dr. Arnoldo Morale. His wife also has health problems. When asked how he felt about six months ago, he states he has not felt well for a while. Up until the surgery he took care of his wife, did the yard work, and was very social. Ever since the surgery, his daughter notes he "has really checked out".   The patient has had problems for several months. He had an admission in November secondary to rectal bleeding. A colonoscopy was attempted in October with Dr. Oneida Alar but unable to be completed because of a redundant left colon. The patient and daughter note that he was referred to [facility] for colonoscopy but was unable to go because his daughter notes that she became sick.  He has lost about 14 lbs  in the last few months. He has always been slender. He no longer has an appetite. Denies vomiting. Denies current bleeding. He did not wear oxygen before going to the hospital. He is living back at home now.   He was previously getting around with a cane really well. However, there was an instance when his wife fell and she would not let him help her up and he was unable to use the phone because he could not remember how. The only thing he was able to do was bring her blankets so she was not cold. His daughter thinks that this episode may have affected him significantly mentally.  States that his bowels are "about like they've always been", he continues to take Miralax. His daughter gives him half the dose of Miralax along with stool softeners. He no longer goes anywhere much. Before going to the hospital, he drove but not a lot. His daughter reports her father's memory has worsened after going to the hospital and she is concerned whether it will come back. His daughter misses how her father was 3-4 weeks ago, as his recent confusion and anger are very worrying. She began to tear up discussing these symptoms.  Mr. Gurkin and his family have already discussed end of life decisions. He already has a burial plot and funeral plans. States that he does not care to answer what his end of life wishes are when asked about them. He has spoken about these topics with his daughter who reports that he does not want pain in the end.  [name] [name] has tried to contact palliative care  but they were unable to help her and have not called back. She tried to set up physical therapy with "[facility] thing" with no luck.  The patient's daughter reports that she tries to keep her father up during the day with only a nap so that he can sleep at night. Mr. Brenizer states that he watches television most of the day.   Since February of this year he has lost greater than 30 lbs. He has had a significant loss in performance status and  memory. He has significant malnutrition and in spite of his recent surgery he remains unable to retain adequate nutrition. They are here to discuss options.   MEDICAL HISTORY:  Past Medical History  Diagnosis Date  . Diabetes mellitus, type 2 ([place])   . Essential hypertension, benign   . Coronary atherosclerosis of native coronary artery 1992    Multivessel s/p CABG 1992  . Hyperlipidemia   . TIA (transient ischemic attack)   . History of pneumonia   . Benign prostatic hypertrophy   . Chronic anemia     SURGICAL HISTORY: Past Surgical History  Procedure Laterality Date  . Coronary artery bypass graft  1992  . Tonsillectomy    . Hernia repair    . Cataract extraction Bilateral   . Colonoscopy N/A 02/24/2015    INCOMPLETE DUE TO REDUNDANT LEFT COLON  . Esophagogastroduodenoscopy N/A 02/24/2015    ATROPHIC GASTRITIS, DUODENITIS  . Colonoscopy N/A 04/29/2015    Procedure: COLONOSCOPY;  Surgeon: Daneil Dolin, MD;  Location: [facility] END[facility] SUITE;  Service: Endoscopy;  Laterality: N/A;  . Bowel resection N/A 05/03/2015    Procedure:  PARTIAL SMALL BOWEL RESECTION;  Surgeon: Aviva Signs, MD;  Location: [facility] ORS;  Service: General;  Laterality: N/A;    SOCIAL HISTORY: Social History   Social History  . Marital Status: Married    Spouse Name: N/A  . Number of Children: N/A  . Years of Education: N/A   Occupational History  . Retired    Social History Main Topics  . Smoking status: Former Smoker -- 1 years    Types: Cigarettes    Start date: 05/26/1939    Quit date: 06/20/1994  . Smokeless tobacco: Not on file  . Alcohol Use: No  . Drug Use: No  . Sexual Activity: No   Other Topics Concern  . Not on file   Social History Narrative  Married 50 years. 3 daughters. 1 son he has not seen in years. 2 of the daughters live in [place]. 7 grandchildren. 8 or 9 great-grandchildren. Ex-smoker, quit about 25 years ago. ETOH, has not drank in years. Previously work at [employer] for 44 years.  FAMILY HISTORY: Family History  Problem Relation Age of Onset  . Cancer Father   . Cancer Mother    indicated that his mother is deceased. He indicated that his father is deceased. He indicated that his brother is deceased.  Mother died at 73 yo of cancer. He is unsure what type. Father died in his 13s of a stroke. 7 siblings. 1 died in the [employer].  ALLERGIES:  is allergic to ambien; aspirin; and morphine and related.  MEDICATIONS:  Current Outpatient Prescriptions  Medication Sig Dispense Refill  . acetaminophen (TYLENOL) 650 MG CR tablet Take 650 mg by mouth every 8 (eight) hours as needed for pain.    Marland Kitchen albuterol (PROAIR HFA) 108 (90 Base) MCG/ACT inhaler Inhale 2 puffs into the lungs every 6 (six) hours as needed  for wheezing or shortness of breath.    . Cholecalciferol (VITAMIN D PO) Take 1 capsule by mouth daily.    . clonazePAM (KLONOPIN) 0.5 MG tablet Take one tablet by mouth at bedtime for rest 30 tablet 0  . cyclobenzaprine (FLEXERIL) 10 MG tablet Take 5 mg by mouth 3 (three) times daily as needed for muscle spasms.    Mariane Baumgarten Sodium (RA COL-RITE PO) Take 2 capsules by mouth at bedtime.    . dutasteride (AVODART) 0.5 MG capsule Take 0.5 mg by mouth daily.    . ferrous sulfate 325 (65 FE) MG tablet Take 1 tablet (325 mg total) by mouth daily with breakfast. 30 tablet 0  . Magnesium 200 MG TABS Take 1 tablet by mouth daily.    . metFORMIN (GLUCOPHAGE) 500 MG tablet Take 500 mg by mouth 2 (two) times daily with a meal.     . Multiple Vitamins-Minerals (ONE-A-DAY 50 PLUS PO) Take 1 tablet by mouth daily.    . Omega-3 Fatty Acids (FISH OIL) 1200 MG CAPS Take 1 capsule by mouth daily.    Marland Kitchen oxyCODONE-acetaminophen (PERCOCET/ROXICET) 5-325 MG tablet Take 1 tablet by mouth every 4 (four) hours as needed for severe pain. 60 tablet 0  . pantoprazole (PROTONIX) 40 MG tablet Take 1 tablet (40 mg total) by mouth daily before breakfast. 30 tablet 1  . quinapril  (ACCUPRIL) 20 MG tablet Take 20 mg by mouth daily.    . Tamsulosin HCl (FLOMAX) 0.4 MG CAPS Take 0.4 mg by mouth daily.     . traMADol (ULTRAM) 50 MG tablet Take 1 tablet (50 mg total) by mouth every 6 (six) hours as needed for moderate pain. 120 tablet 0  . vitamin B-12 (CYANOCOBALAMIN) 1000 MCG tablet Take 1,000 mcg by mouth daily.    Marland Kitchen amoxicillin-clavulanate (AUGMENTIN) 875-125 MG tablet Take 1 tablet by mouth every 12 (twelve) hours. (Patient not taking: Reported on 05/24/2015) 18 tablet 0  . fluconazole (DIFLUCAN) 100 MG tablet Take 1 tablet (100 mg total) by mouth daily. (Patient not taking: Reported on 05/24/2015) 14 tablet 0  . lidocaine (LIDODERM) 5 % Place 1 patch onto the skin daily. Remove & Discard patch within 12 hours or as directed by MD 15 patch 0  . magic mouthwash w/lidocaine SOLN Take 5 mLs by mouth 3 (three) times daily. (Patient not taking: Reported on 05/24/2015)  0  . metoprolol tartrate (LOPRESSOR) 25 MG tablet Take 1 tablet (25 mg total) by mouth 2 (two) times daily. (Patient not taking: Reported on 05/24/2015)     No current facility-administered medications for this visit.    Review of Systems  Constitutional: Positive for weight loss and malaise/fatigue. Negative for fever and chills.       Appetite loss. Weight loss of 14 lbs.  HENT: Positive for hearing loss. Negative for congestion, nosebleeds, sore throat and tinnitus.   Eyes: Negative.  Negative for blurred vision, double vision, pain and discharge.  Respiratory: Negative.  Negative for cough, hemoptysis, sputum production, shortness of breath and wheezing.   Cardiovascular: Negative.  Negative for chest pain, palpitations, claudication, leg swelling and PND.  Gastrointestinal: Positive for constipation. Negative for heartburn, nausea, vomiting, abdominal pain, diarrhea, blood in stool and melena.       Constipation managed with Miralax and stool softeners - Decreased appetite  Genitourinary: Negative.   Negative for dysuria, urgency, frequency and hematuria.  Musculoskeletal: Positive for back pain. Negative for myalgias, joint pain and falls.  Skin: Negative.  Negative for itching and rash.  Neurological: Positive for weakness. Negative for dizziness, tingling, tremors, sensory change, speech change, focal weakness, seizures, loss of consciousness and headaches.  Endo/Heme/Allergies: Negative.  Does not bruise/bleed easily.  Psychiatric/Behavioral: Positive for memory loss. Negative for depression, suicidal ideas and substance abuse. The patient is not nervous/anxious and does not have insomnia.        Confusion and mood changes.  All other systems reviewed and are negative.  14 point ROS was done and is otherwise as detailed above or in HPI  PHYSICAL EXAMINATION: ECOG PERFORMANCE STATUS: 3 - Symptomatic, >50% confined to bed  Filed Vitals:   05/24/15 1400  BP: 100/64  Pulse: 105  Temp: 97.7 F (36.5 C)  Resp: 22   Filed Weights   05/24/15 1400  Weight: 114 lb 9.6 oz (51.982 kg)    Physical Exam  Constitutional: He is oriented to person, place, and time and well-developed, well-nourished, and in no distress.  Nasal cannula in place. Thin. Visible distress when transitioning to lay flat on his back. He was unable to lay flat so the exam table was elevated for him.  HENT:  Head: Normocephalic and atraumatic.  Nose: Nose normal.  Mouth/Throat: Oropharynx is clear and moist. No oropharyngeal exudate.  Eyes: Conjunctivae and EOM are normal. Pupils are equal, round, and reactive to light. Right eye exhibits no discharge. Left eye exhibits no discharge. No scleral icterus.  Neck: Normal range of motion. Neck supple. No tracheal deviation present. No thyromegaly present.  Cardiovascular: Normal rate, regular rhythm and normal heart sounds.  Exam reveals no gallop and no friction rub.   No murmur heard. Pulmonary/Chest: Effort normal and breath sounds normal. He has no wheezes. He has  no rales.  Abdominal: Soft. Bowel sounds are normal. He exhibits no distension and no mass. There is tenderness. There is no rebound and no guarding.  Well healed surgical incision sites.  Musculoskeletal: Normal range of motion. He exhibits no edema.  Lymphadenopathy:    He has no cervical adenopathy.  Neurological: He is alert and oriented to person, place, and time. He has normal reflexes. No cranial nerve deficit. Coordination normal.  Needs assistance onto exam table. Significant weakness  Skin: Skin is warm and dry. No rash noted.  Nursing note and vitals reviewed.   LABORATORY DATA:  I have reviewed the data as listed Lab Results  Component Value Date   WBC 6.5 05/10/2015   HGB 8.7* 05/10/2015   HCT 27.7* 05/10/2015   MCV 87.7 05/10/2015   PLT 243 05/10/2015   CMP     Component Value Date/Time   NA 137 05/10/2015 0616   K 3.8 05/10/2015 0616   CL 101 05/10/2015 0616   CO2 31 05/10/2015 0616   GLUCOSE 137* 05/10/2015 0616   BUN 13 05/10/2015 0616   CREATININE 1.20 05/10/2015 0616   CREATININE 1.67* 01/12/2014 0840   CALCIUM 8.4* 05/10/2015 0616   PROT 5.9* 04/28/2015 0541   ALBUMIN 2.4* 04/28/2015 0541   AST 9* 04/28/2015 0541   ALT 11* 04/28/2015 0541   ALKPHOS 53 04/28/2015 0541   BILITOT 0.8 04/28/2015 0541   GFRNONAA 54* 05/10/2015 0616   GFRAA >60 05/10/2015 0616     RADIOGRAPHIC STUDIES: I have personally reviewed the radiological images as listed and agreed with the findings in the report. Study Result     CLINICAL DATA: Incomplete colonoscopy.  EXAM: CT ABDOMEN AND PELVIS WITH CONTRAST  TECHNIQUE: Multidetector CT imaging of the abdomen  and pelvis was performed using the standard protocol following bolus administration of intravenous contrast.  CONTRAST: 174mL OMNIPAQUE IOHEXOL 300 MG/ML SOLN  COMPARISON: None.  FINDINGS: Lower chest: Chronic appearing pleural thickening with calcification overlies the posterior left lung base.  Overlying scar versus atelectasis noted. 3 mm right middle lobe lung nodule is identified, image number 3 of series 6.  Hepatobiliary: Mild diffuse hepatic steatosis. The gallbladder appears normal. No biliary dilatation.  Pancreas: Normal appearance of the pancreas.  Spleen: The spleen is unremarkable.  Adrenals/Urinary Tract: The adrenal glands are normal. Bilateral renal cysts are identified. The urinary bladder appears normal.  Stomach/Bowel: The stomach appears normal. Small bowel loops have a normal course and caliber. There is no evidence for a bowel obstruction. Circumferential mass involving the pelvic small bowel loop is identified. This measures 5.3 x 3.9 x 4.3 cm and results in moderate narrowing of the small bowel lumen, image 61 of series 2 and image 62 of series 3. There is a large left inguinal hernia which contains a loop of sigmoid colon. Sigmoid colon diverticula noted.  Vascular/Lymphatic: Calcified atherosclerotic disease involves the abdominal aorta. No aneurysm. Enlarged lymph node within the lower abdominal ileocolic mesentery measures 1.4 cm, image 50 of series 2.  Reproductive: Mild prostate gland enlargement.  Other: No free fluid or fluid collections identified. No peritoneal nodule or mass.  Musculoskeletal: Degenerative disc disease noted within the lumbar spine. No aggressive lytic or sclerotic bone lesions.  IMPRESSION: 1. Suspicious mass involving a loop of distal small bowel, likely ileum is identified and worrisome for small bowel neoplasm. This does not resolve then any significant small bowel obstruction. Surgical consultation suggested. 2. Enlarged ileocolic lymph node within the lower abdomen may represent a focus of metastatic adenopathy. 3. Left inguinal hernia contains a loop of sigmoid colon which may account for difficulty with colonoscopy. 4. Aortic atherosclerosis. 5. Hepatic steatosis.   Electronically  Signed  By: Kerby Moors M.D.  On: 04/30/2015 12:12     ASSESSMENT & PLAN:  Exploratory laparotomy, partial bowel resection on 05/03/15 with Dr. Arnoldo Morale with pathology showing a poorly differentiated carcinoma, 0/5 lymph nodes.  CT abdomen/pelvis 04/30/15 with mass involving distal small bowel, enlarged ileocolic lymph node, hepatic steatosis Hospital admission 04/27/15 through 05/11/15 secondary to severe protein calorie malnutrition, acute on chronic renal failure, symptomatic blood loss anemia secondary to rectal bleeding from small bowel mass, pneumonia, sepsis  Total hospital admissions of four secondary to GI bleed Anemia  I have discussed with the patient and daughter, [name] [name], at length about Mr. Guia condition along with his onset confusion and mood changes. We discussed the benefits of palliative care in the home as well as an assisted living home. They have not spoken with a palliative care representative/hospice representative even after trying to call them. I will make a referral to [facility]. They are most interested in pursuing comfort which given his ongoing decline is most certainly appropriate.  I have ordered a chest X-ray to be done today. We will call with the results of this study so they can begin antibiotics as needed. Theresa's Cell Phone number: [phone].  Theresa's Work phone Monday-Friday 8am-4:30pm: [phone].  I have written a prescription for lidoderm patches. I have refilled the patient's tramadol. I have written him a 5 mg hydrocodone prescription. I have written a prescription for megace for his appetite.  I discussed the patient's pain management regimen at length with his daughter, [name] [name], who acts as his  primary caregiver.  I will check if we can get the patient some samples of Boost Vanilla. (Does not like Boost Chocolate or Ensure)  The patient will return in one week for follow-up, this visit will be to answer any  additional questions or address any additional needs they may have.   All questions were answered. The patient knows to call the clinic with any problems, questions or concerns.  This document serves as a record of services personally performed by Ancil Linsey, MD. It was created on her behalf by Arlyce Harman, a trained medical scribe. The creation of this record is based on the scribe's personal observations and the provider's statements to them. This document has been checked and approved by the attending provider.  I have reviewed the above documentation for accuracy and completeness, and I agree with the above.  This note was electronically signed.    Molli Hazard, MD  05/24/2015 2:41 PM

## 2015-05-28 ENCOUNTER — Other Ambulatory Visit: Payer: Self-pay | Admitting: *Deleted

## 2015-05-28 NOTE — Patient Outreach (Signed)
[facility]) Care Management  05/28/2015  JAKOBY VANDYKEN 1931-05-04 [id number]   Telephone call to contact person; left message requesting call back.  Plan:  Follow up.  Sherrin Daisy, RN BSN [place] Management Coordinator [facility] Care Management  [phone]

## 2015-05-30 ENCOUNTER — Encounter: Payer: Self-pay | Admitting: *Deleted

## 2015-05-30 ENCOUNTER — Other Ambulatory Visit: Payer: Self-pay | Admitting: *Deleted

## 2015-05-30 ENCOUNTER — Telehealth (HOSPITAL_COMMUNITY): Payer: Self-pay | Admitting: Hematology & Oncology

## 2015-05-30 NOTE — Patient Outreach (Signed)
[facility]) Care Management  05/30/2015  KESHUN WASZAK 19-Oct-1930 [id number]  Received call from contact-Terressa/daughter of patient. HIPPA verification received. States patient has received Hospice referral from MD office and they have appointment with them tomorrow. States she will call back if patient still has needs after talking with hospital. States patient may need hospital bed and physical therapy . RN CM clarified and explained services that [facility] provided. Advised of differences of home health and personal care services versus care management/disease management services. Contact person voices that she understands and will call back if our services are needed after appointment tomorrow.  Plan: will send [facility] brochure with consent of contact person.  Sherrin Daisy, RN BSN [place] Management Coordinator [facility] Care Management  [phone]

## 2015-05-30 NOTE — Telephone Encounter (Signed)
FAXED APPEAL TO [place]

## 2015-05-31 ENCOUNTER — Encounter (HOSPITAL_COMMUNITY): Payer: Medicare Other | Attending: Hematology & Oncology | Admitting: Oncology

## 2015-05-31 ENCOUNTER — Encounter (HOSPITAL_COMMUNITY): Payer: Self-pay | Admitting: Oncology

## 2015-05-31 VITALS — BP 92/54 | HR 114 | Temp 96.4°F | Resp 18 | Wt 112.0 lb

## 2015-05-31 DIAGNOSIS — K6389 Other specified diseases of intestine: Secondary | ICD-10-CM

## 2015-05-31 DIAGNOSIS — R52 Pain, unspecified: Secondary | ICD-10-CM | POA: Diagnosis not present

## 2015-05-31 DIAGNOSIS — Z66 Do not resuscitate: Secondary | ICD-10-CM | POA: Diagnosis not present

## 2015-05-31 NOTE — Assessment & Plan Note (Addendum)
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  S/P resection by Dr. Arnoldo Morale on 05/03/2015 resulting in the aforementioned pathology report.  No a candidate for systemic chemotherapy.  DNR  Palliative/symptom management only.  I personally reviewed and went over radiographic studies with the patient.  The results are noted within this dictation.  Chest xray is much improved.  No role for labs today.  Refer to Hospice.

## 2015-05-31 NOTE — Progress Notes (Signed)
[name], MD [place] [address]  Small bowel mass  CURRENT THERAPY:Palliative/symptom management  INTERVAL HISTORY: GRACE AMBROGIO 82 y.o. 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  S/P resection by Dr. Arnoldo Morale on 05/03/2015 resulting in the aforementioned pathology report.  No a candidate for systemic chemotherapy.  I personally reviewed and went over radiographic studies with the patient.  The results are noted within this dictation.  Chest xray is much improved and stable.  He was previously prescribed a number of medication for symptom management: Clonazepam for agitation Vicodin for pain control Lidoderm patch for pain control Megace for appetite stimulation Ultram for pain control  Pain continues to be a major issue for the patient.  His pain is secondary to compression fracture of L4.  Unfortunately, his insurance denied Lidocaine patch for pain control despite our appeal.  His niece paid out of pocket for the medication, $109.  The patient is accompanied by a number of nieces today (3).  We reviewed his CT scans, pathology, diagnosis, and co-morbidities.  I answered all of their questions.    We discussed goals of care and the patient and his family are very clear that pain control, quality of life, and being at home with family is most important.  With this in mind, he would be best served with hospice.  His condition has declined since his last visit 1 week ago.  He is not a candidate for systemic chemotherapy, nor further imaging studies.   Past Medical History  Diagnosis Date  . Diabetes mellitus, type 2 ([place])   . Essential hypertension, benign   . Coronary atherosclerosis of native coronary artery 1992    Multivessel s/p CABG 1992  . Hyperlipidemia   . TIA (transient ischemic attack)   . History of pneumonia   . Benign prostatic  hypertrophy   . Chronic anemia     has Dyspnea; Diabetes mellitus, type 2 ([place]); Essential hypertension, benign; Coronary atherosclerosis of native coronary artery; Hyperlipidemia; TIA (transient ischemic attack); Tobacco abuse; Anemia; Acute on chronic renal insufficiency ([place]); Leg pain, bilateral; Symptomatic anemia; Chronic kidney disease, stage III (moderate); Fecal occult blood test positive; Occult GI bleeding; Iron deficiency anemia; Acute renal failure superimposed on stage 3 chronic kidney disease ([place]); Dehydration; Fecal impaction in rectum ([place]); Constipation; Falls; Lumbar compression fracture ([place]); Hyperkalemia; Hyponatremia; BPH (benign prostatic hypertrophy); Protein-calorie malnutrition, severe; Small bowel mass; Unilateral recurrent inguinal hernia without obstruction or gangrene; Abnormality of rectum; Palliative care encounter; and DNR (do not resuscitate) discussion on his problem list.     is allergic to ambien; aspirin; and morphine and related.  Current Outpatient Prescriptions on File Prior to Visit  Medication Sig Dispense Refill  . acetaminophen (TYLENOL) 650 MG CR tablet Take 650 mg by mouth every 8 (eight) hours as needed for pain.    Marland Kitchen albuterol (PROAIR HFA) 108 (90 Base) MCG/ACT inhaler Inhale 2 puffs into the lungs every 6 (six) hours as needed for wheezing or shortness of breath.    . Cholecalciferol (VITAMIN D PO) Take 1 capsule by mouth daily.    . clonazePAM (KLONOPIN) 0.5 MG tablet Take one-two tablets at bedtime as needed for agitation 30 tablet 0  . Docusate Sodium (RA COL-RITE PO) Take 2 capsules by mouth at bedtime.    . dutasteride (AVODART) 0.5 MG capsule Take 0.5 mg by mouth daily.    Marland Kitchen  HYDROcodone-acetaminophen (NORCO/VICODIN) 5-325 MG tablet Take 1-2 tablets by mouth every 4 (four) hours as needed for moderate pain. 90 tablet 0  . lidocaine (LIDODERM) 5 % Place 1 patch onto the skin daily. Remove & Discard patch within 12 hours or as directed by MD 15  patch 0  . Magnesium 200 MG TABS Take 1 tablet by mouth daily.    . megestrol (MEGACE) 400 MG/10ML suspension Take 10 mLs (400 mg total) by mouth daily. 240 mL 3  . metFORMIN (GLUCOPHAGE) 500 MG tablet Take 500 mg by mouth 2 (two) times daily with a meal.     . Multiple Vitamins-Minerals (ONE-A-DAY 50 PLUS PO) Take 1 tablet by mouth daily.    . Omega-3 Fatty Acids (FISH OIL) 1200 MG CAPS Take 1 capsule by mouth daily.    Marland Kitchen oxyCODONE-acetaminophen (PERCOCET/ROXICET) 5-325 MG tablet Take 1 tablet by mouth every 4 (four) hours as needed for severe pain. 60 tablet 0  . pantoprazole (PROTONIX) 40 MG tablet Take 1 tablet (40 mg total) by mouth daily before breakfast. 30 tablet 1  . quinapril (ACCUPRIL) 20 MG tablet Take 20 mg by mouth daily.    . Tamsulosin HCl (FLOMAX) 0.4 MG CAPS Take 0.4 mg by mouth daily.     . traMADol (ULTRAM) 50 MG tablet Take 1 tablet (50 mg total) by mouth every 6 (six) hours as needed for moderate pain. 120 tablet 0  . vitamin B-12 (CYANOCOBALAMIN) 1000 MCG tablet Take 1,000 mcg by mouth daily.    . cyclobenzaprine (FLEXERIL) 10 MG tablet Take 5 mg by mouth 3 (three) times daily as needed for muscle spasms. Reported on 05/31/2015    . ferrous sulfate 325 (65 FE) MG tablet Take 1 tablet (325 mg total) by mouth daily with breakfast. (Patient not taking: Reported on 05/31/2015) 30 tablet 0  . fluconazole (DIFLUCAN) 100 MG tablet Take 1 tablet (100 mg total) by mouth daily. (Patient not taking: Reported on 05/24/2015) 14 tablet 0  . magic mouthwash w/lidocaine SOLN Take 5 mLs by mouth 3 (three) times daily. (Patient not taking: Reported on 05/24/2015)  0  . metoprolol tartrate (LOPRESSOR) 25 MG tablet Take 1 tablet (25 mg total) by mouth 2 (two) times daily. (Patient not taking: Reported on 05/24/2015)     No current facility-administered medications on file prior to visit.    Past Surgical History  Procedure Laterality Date  . Coronary artery bypass graft  1992  . Tonsillectomy     . Hernia repair    . Cataract extraction Bilateral   . Colonoscopy N/A 02/24/2015    INCOMPLETE DUE TO REDUNDANT LEFT COLON  . Esophagogastroduodenoscopy N/A 02/24/2015    ATROPHIC GASTRITIS, DUODENITIS  . Colonoscopy N/A 04/29/2015    Procedure: COLONOSCOPY;  Surgeon: Daneil Dolin, MD;  Location: [facility];  Service: Endoscopy;  Laterality: N/A;  . Bowel resection N/A 05/03/2015    Procedure:  PARTIAL SMALL BOWEL RESECTION;  Surgeon: Aviva Signs, MD;  Location: [facility] ORS;  Service: General;  Laterality: N/A;    ROS questioning unattainable due to poor participation from the patient.   PHYSICAL EXAMINATION  ECOG PERFORMANCE STATUS: 3 - Symptomatic, >50% confined to bed  Filed Vitals:   05/31/15 1300  BP: 92/54  Pulse: 114  Temp: 96.4 F (35.8 C)  Resp: 18    GENERAL:alert, cachectic, cooperative, ill looking, malnourished, mild distress and in wheelchair and accompanied by 3 nieces. SKIN: skin color, texture, turgor are normal, no rashes or significant  lesions HEAD: Normocephalic, No masses, lesions, tenderness or abnormalities EYES: normal, Conjunctiva are pink and non-injected EARS: External ears normal OROPHARYNX:no thrush  NECK: supple, trachea midline LYMPH:  not examined BREAST:not examined LUNGS: not examined HEART: not examined ABDOMEN:not examined BACK: not examined EXTREMITIES:no edema, no skin discoloration  NEURO: slow mentation, garbled speech, in wheelchair.  LABORATORY DATA: CBC    Component Value Date/Time   WBC 6.5 05/10/2015 0616   RBC 3.16* 05/10/2015 0616   RBC 1.69* 04/04/2015 1120   HGB 8.7* 05/10/2015 0616   HCT 27.7* 05/10/2015 0616   PLT 243 05/10/2015 0616   MCV 87.7 05/10/2015 0616   MCH 27.5 05/10/2015 0616   MCHC 31.4 05/10/2015 0616   RDW 14.9 05/10/2015 0616   LYMPHSABS 0.6* 04/27/2015 1221   MONOABS 0.6 04/27/2015 1221   EOSABS 0.2 04/27/2015 1221   BASOSABS 0.0 04/27/2015 1221      Chemistry      Component Value  Date/Time   NA 137 05/10/2015 0616   K 3.8 05/10/2015 0616   CL 101 05/10/2015 0616   CO2 31 05/10/2015 0616   BUN 13 05/10/2015 0616   CREATININE 1.20 05/10/2015 0616   CREATININE 1.67* 01/12/2014 0840      Component Value Date/Time   CALCIUM 8.4* 05/10/2015 0616   ALKPHOS 53 04/28/2015 0541   AST 9* 04/28/2015 0541   ALT 11* 04/28/2015 0541   BILITOT 0.8 04/28/2015 0541        PENDING LABS:   RADIOGRAPHIC STUDIES:  Dg Chest 2 View  05/24/2015  CLINICAL DATA:  Cough, fever and weakness. EXAM: CHEST  2 VIEW COMPARISON:  Previous examinations. FINDINGS: Patchy opacity in the left mid and lower lung zones with significant improvement since 05/06/2015. Clear right lung. Minimal left pleural fluid with significant improvement. Mild scoliosis. Post CABG changes. Diffuse osteopenia. IMPRESSION: Significantly improved pneumonia and pleural fluid on the left. Electronically Signed   By: Claudie Revering M.D.   On: 05/24/2015 16:01   Ct Chest W Contrast  05/09/2015  CLINICAL DATA:  Abnormal chest radiograph with left lower lobe opacity EXAM: CT CHEST WITH CONTRAST TECHNIQUE: Multidetector CT imaging of the chest was performed during intravenous contrast administration. CONTRAST:  43mL OMNIPAQUE IOHEXOL 300 MG/ML  SOLN COMPARISON:  05/06/2015 FINDINGS: There is consolidation in the left lower lobe involving the posterior lateral aspect with air bronchograms throughout the area of abnormality. There is also mild infiltrate across the fissure in the posterior inferior lingula. There is infiltrate in the posterior aspect of the right upper lobe. There is a small loculated left pleural effusion extending into the fissure. On the right, there is a moderate pleural effusion. There is dependent consolidation in the posterior right lower lobe most likely representing atelectasis related to the effusion. Thoracic inlet and thyroid are normal. There is moderate calcification of the thoracic aorta. There are  numerous mediastinal lymph nodes. The largest is a precarinal lymph node measuring 11 mm. There is coronary artery calcification. There is no significant pericardial effusion. In this patient that is status post abdominal surgery there is again noted to be pneumoperitoneum as described on report of prior chest radiograph. There is a small volume of free fluid in the upper abdomen. IMPRESSION: Evidence of multifocal pneumonia on the left involving the lower lobe and lingula with small partially loculated left effusion. Moderate right pleural effusion with consolidation most consistent with atelectasis at the lung base. Infiltrate right upper lobe could represent pneumonia/ pneumonitis. Mildly enlarged mediastinal lymph  nodes possibly reactive. Pneumoperitoneum as previously described likely related to postsurgical status. Electronically Signed   By: Skipper Cliche M.D.   On: 05/09/2015 16:59   US Venous Img Lower Bilateral  05/05/2015  CLINICAL DATA:  Bilateral lower extremity pain EXAM: BILATERAL LOWER EXTREMITY VENOUS DUPLEX ULTRASOUND TECHNIQUE: Gray-scale sonography with graded compression, as well as color Doppler and duplex ultrasound were performed to evaluate the lower extremity deep venous systems from the level of the common femoral vein and including the common femoral, femoral, profunda femoral, popliteal and calf veins including the posterior tibial, peroneal and gastrocnemius veins when visible. The superficial great saphenous vein was also interrogated. Spectral Doppler was utilized to evaluate flow at rest and with distal augmentation maneuvers in the common femoral, femoral and popliteal veins. COMPARISON:  None. FINDINGS: RIGHT LOWER EXTREMITY Common Femoral Vein: No evidence of thrombus. Normal compressibility, respiratory phasicity and response to augmentation. Saphenofemoral Junction: No evidence of thrombus. Normal compressibility and flow on color Doppler imaging. Profunda Femoral Vein: No  evidence of thrombus. Normal compressibility and flow on color Doppler imaging. Femoral Vein: No evidence of thrombus. Normal compressibility, respiratory phasicity and response to augmentation. Popliteal Vein: No evidence of thrombus. Normal compressibility, respiratory phasicity and response to augmentation. Calf Veins: No evidence of thrombus. Normal compressibility and flow on color Doppler imaging. Superficial Great Saphenous Vein: No evidence of thrombus. Normal compressibility and flow on color Doppler imaging. Venous Reflux:  None. Other Findings:  None. LEFT LOWER EXTREMITY Common Femoral Vein: No evidence of thrombus. Normal compressibility, respiratory phasicity and response to augmentation. Saphenofemoral Junction: No evidence of thrombus. Normal compressibility and flow on color Doppler imaging. Profunda Femoral Vein: No evidence of thrombus. Normal compressibility and flow on color Doppler imaging. Femoral Vein: No evidence of thrombus. Normal compressibility, respiratory phasicity and response to augmentation. Popliteal Vein: No evidence of thrombus. Normal compressibility, respiratory phasicity and response to augmentation. Calf Veins: No evidence of thrombus. Normal compressibility and flow on color Doppler imaging. Superficial Great Saphenous Vein: No evidence of thrombus. Normal compressibility and flow on color Doppler imaging. Venous Reflux:  None. Other Findings:  None. IMPRESSION: No evidence of deep venous thrombosis in either lower extremity. Electronically Signed   By: Lowella Grip III M.D.   On: 05/05/2015 10:33   Dg Chest Port 1 View  05/06/2015  CLINICAL DATA:  Shortness of breath. Leukocytosis. Recent abdominal surgery. EXAM: PORTABLE CHEST 1 VIEW COMPARISON:  04/04/2015. FINDINGS: Prior CABG. Heart size stable. Dense left lung diffuse infiltrate noted consistent with pneumonia. Prominent areas of bilateral subsegmental atelectasis. Free intraperitoneal air, most likely from  recent abdominal surgery . Scratched Clinical correlation is suggested. Abdominal series can be obtained for further evaluation. IMPRESSION: 1. Free intraperitoneal air, most likely from recent abdominal surgery. Clinical correlation suggested. Abdominal series can be obtained for further evaluation . 2. Diffuse dense left lung infiltrate consistent pneumonia. Multifocal bilateral subsegmental atelectasis . Critical Value/emergent results were called by telephone at the time of interpretation on 05/06/2015 at 8:41 am to nurse Anderson Malta, who verbally acknowledged these results. Electronically Signed   By: Marcello Moores  Register   On: 05/06/2015 08:43   Dg Foot Complete Left  05/13/2015  CLINICAL DATA:  Two month history of left foot pain without known injury; patient has visible redness over the first MTP joint region. EXAM: LEFT FOOT - COMPLETE 3+ VIEW COMPARISON:  None in PACs FINDINGS: The bones of the left foot are adequately mineralized. There is narrowing of the first MTP joint.  The articular surfaces remain smooth. There are no erosive changes. There is mild soft tissue swelling over the medial aspect of the first MTP joint. No soft tissue calcifications are observed. The second through fifth MTP joints are unremarkable. The interphalangeal joints exhibit very mild narrowing consistent with mild osteoarthritic change. The tarsometatarsal and intertarsal joints are unremarkable. There are tiny plantar and Achilles region calcaneal spurs. IMPRESSION: Mild soft tissue swelling over the medial aspect of the first MTP joint may reflect the presence of gout. No bony changes of gout are observed. There are minimal osteoarthritic changes of the first MTP joint. Electronically Signed   By: David  Martinique M.D.   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  S/P resection  by Dr. Arnoldo Morale on 05/03/2015 resulting in the aforementioned pathology report.  No a candidate for systemic chemotherapy.  DNR  Palliative/symptom management only.  I personally reviewed and went over radiographic studies with the patient.  The results are noted within this dictation.  Chest xray is much improved.  No role for labs today.  Refer to Hospice.    THERAPY PLAN:  Refer to [place].  All questions were answered. The patient knows to call the clinic with any problems, questions or concerns. We can certainly see the patient much sooner if necessary.  25 minutes was spent in face to face interview, 30 minutes in total on encounter.  Patient and plan discussed with Dr. Ancil Linsey and she is in agreement with the aforementioned.   This note is electronically signed by: Doy Mince 05/31/2015 3:48 PM

## 2015-05-31 NOTE — Patient Instructions (Addendum)
[place] at [facility] Discharge Instructions  RECOMMENDATIONS MADE BY THE CONSULTANT AND ANY TEST RESULTS WILL BE SENT TO YOUR REFERRING PHYSICIAN.  Exam and discussion by Robynn Pane, PA-C Call with any concerns or issues Will not schedule any follow-up.   Hospice should be calling you today.  Thank you for choosing [place] at [facility] to provide your oncology and hematology care.  To afford each patient quality time with our provider, please arrive at least 15 minutes before your scheduled appointment time.    You need to re-schedule your appointment should you arrive 10 or more minutes late.  We strive to give you quality time with our providers, and arriving late affects you and other patients whose appointments are after yours.  Also, if you no show three or more times for appointments you may be dismissed from the clinic at the providers discretion.     Again, thank you for choosing [facility].  Our hope is that these requests will decrease the amount of time that you wait before being seen by our physicians.       _____________________________________________________________  Should you have questions after your visit to [facility], please contact our office at [phone] between the hours of 8:30 a.m. and 4:30 p.m.  Voicemails left after 4:30 p.m. will not be returned until the following business day.  For prescription refill requests, have your pharmacy contact our office.

## 2015-06-07 ENCOUNTER — Encounter: Payer: Self-pay | Admitting: *Deleted

## 2015-06-07 NOTE — Patient Outreach (Signed)
[facility]) Care Management  06/07/2015  [name] [name] 08-05-1930 [id number]  Per Epic review; patient has been referred to Hospice.  Plan: [facility] care management will close case. Primary care sent closure letter.  Sherrin Daisy, RN BSN [place] Management Coordinator [facility] Care Management  [phone]

## 2015-06-22 ENCOUNTER — Encounter (HOSPITAL_COMMUNITY): Payer: Self-pay | Admitting: Hematology & Oncology

## 2015-06-26 DEATH — deceased

## 2016-03-21 IMAGING — CT CT CHEST W/ CM
2 of 3 series · 15 of 36 positions shown, 18 images · IV contrast (omnipaque)
Comparison: 05/06/2015

CLINICAL DATA: Abnormal chest radiograph with left lower lobe
opacity

EXAM:
CT CHEST WITH CONTRAST
TECHNIQUE: Multidetector CT imaging of the chest was performed during
intravenous contrast administration.
CONTRAST:  75mL OMNIPAQUE IOHEXOL 300 MG/ML  SOLN

[Series 3: chestroutine 5.0 b40f · axial · 0.57mm/px · z∈[+862,+1062]mm · 12 of 48 slices shown, 15 images]
[im 4/48  mediastinal]
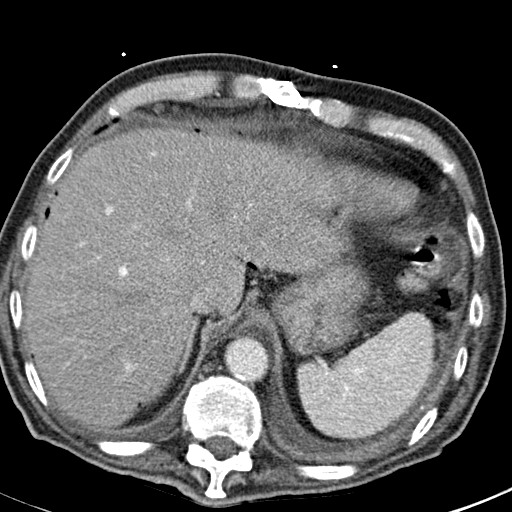
[im 4/48  lung]
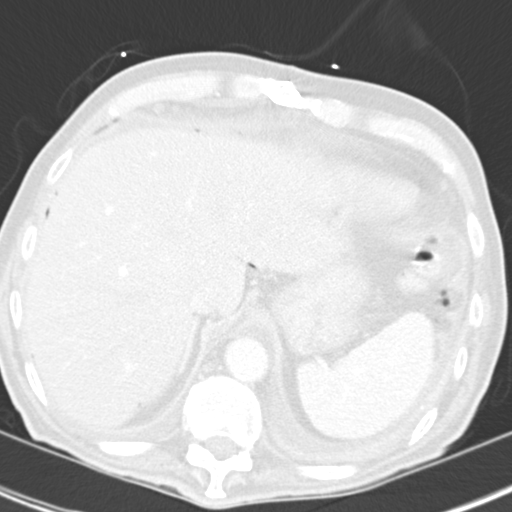
[im 7/48  lung]
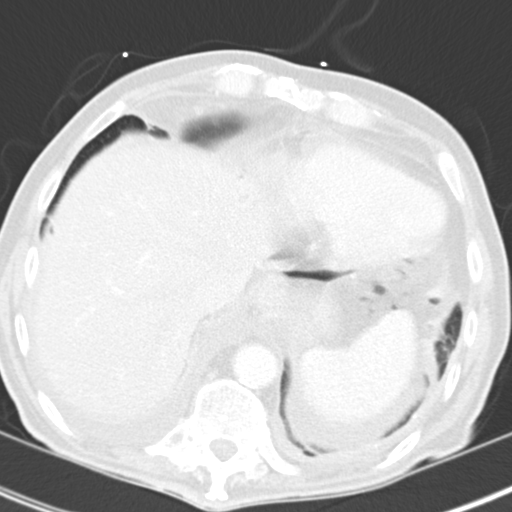
[im 11/48  lung]
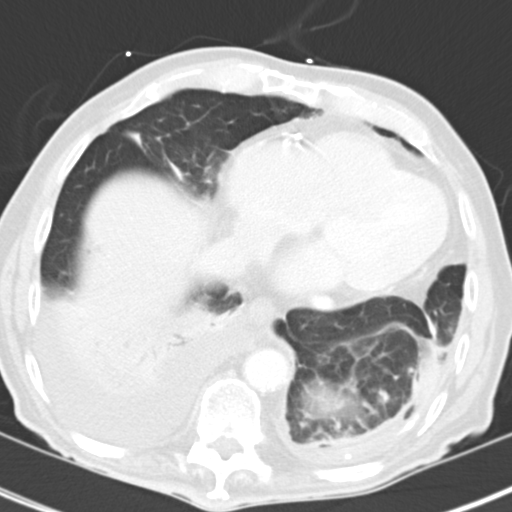
[im 14/48  lung]
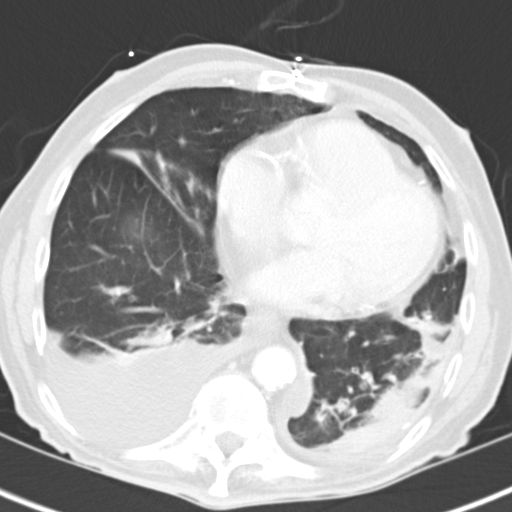
[im 18/48  mediastinal]
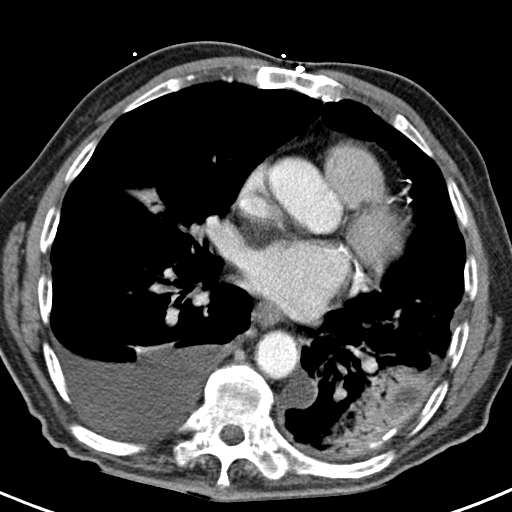
[im 18/48  lung]
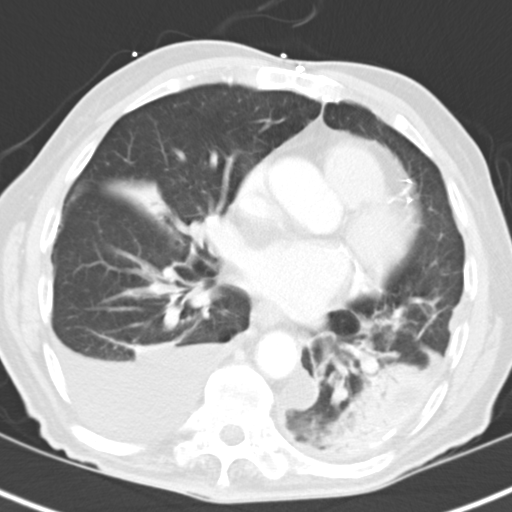
[im 21/48  lung]
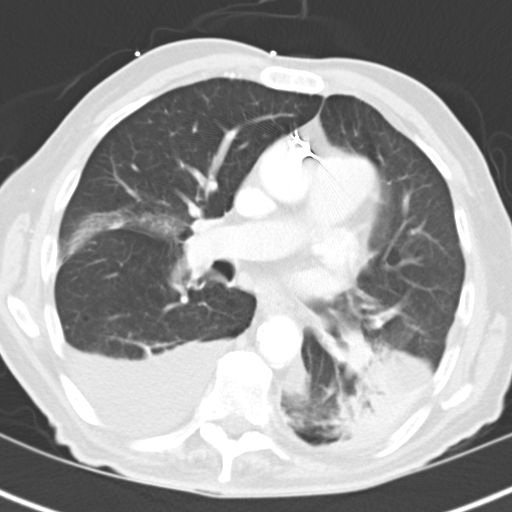
[im 27/48  lung]
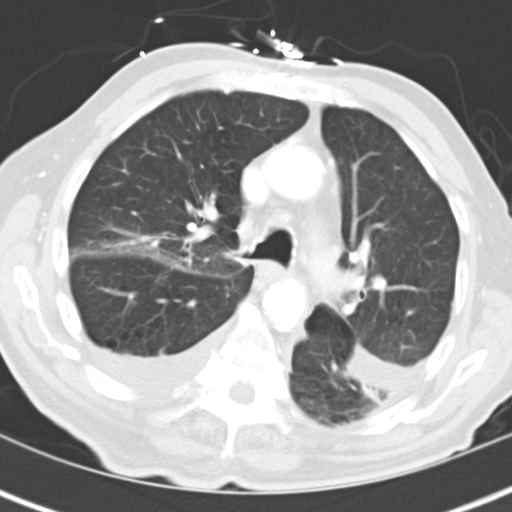
[im 30/48  lung]
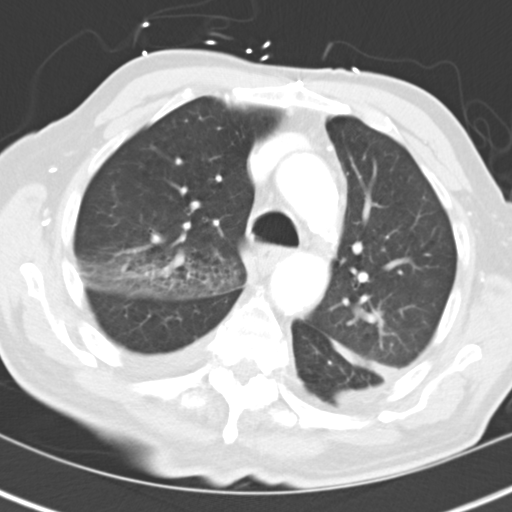
[im 34/48  mediastinal]
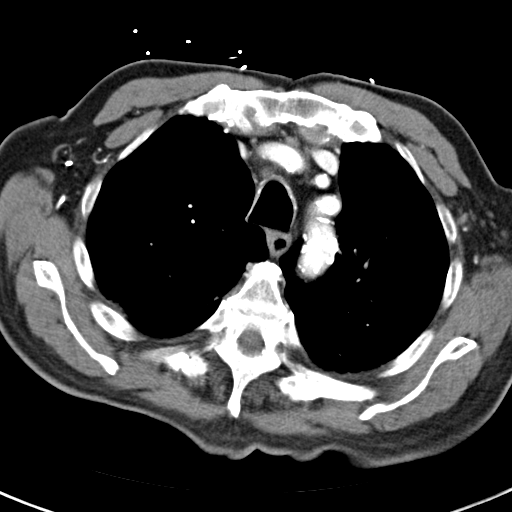
[im 34/48  lung]
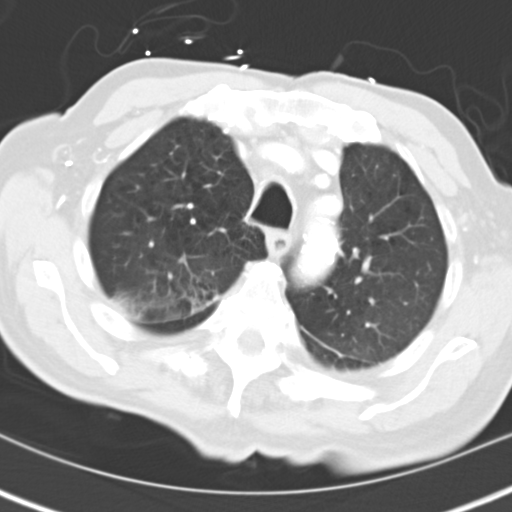
[im 37/48  lung]
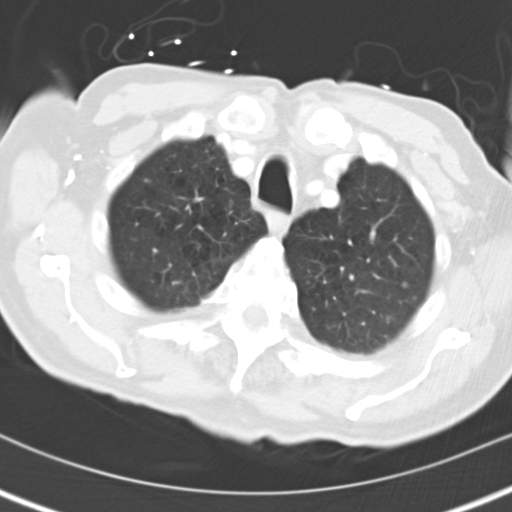
[im 41/48  lung]
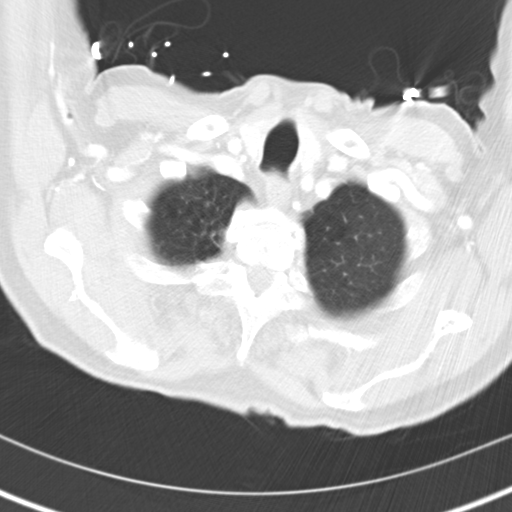
[im 44/48  lung]
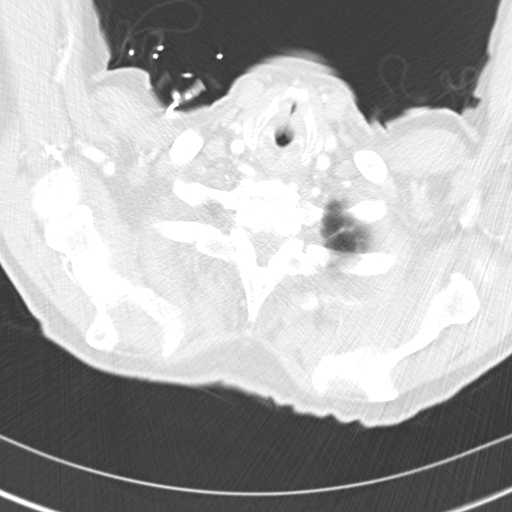

[Series 5: mpr coronal chest 3mm · coronal · 0.49mm/px · 3 of 98 slices shown]
[im 20/98  lung]
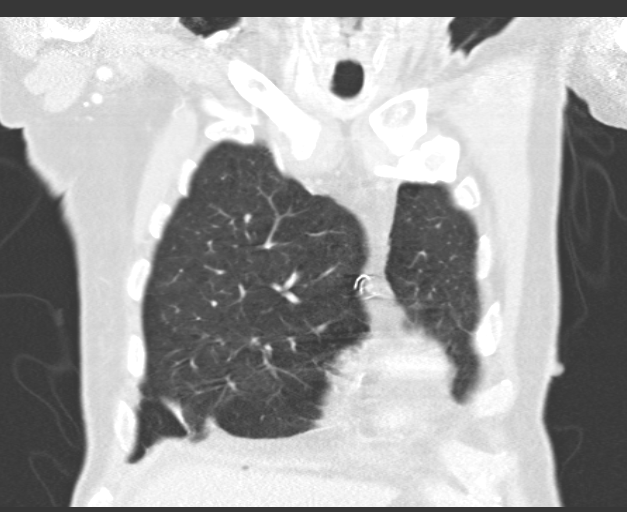
[im 39/98  lung]
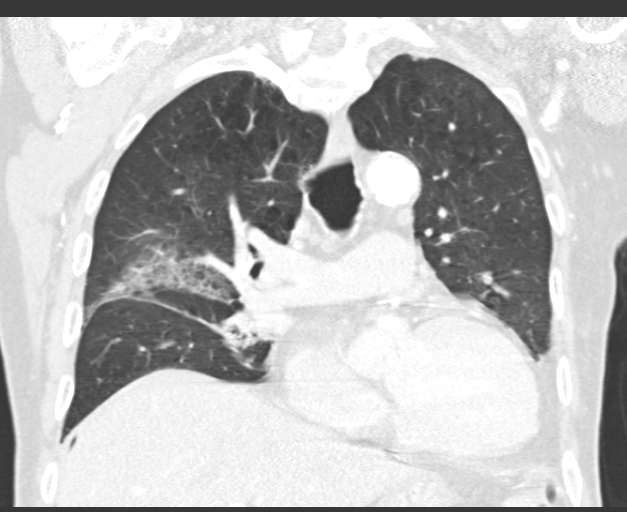
[im 59/98  lung]
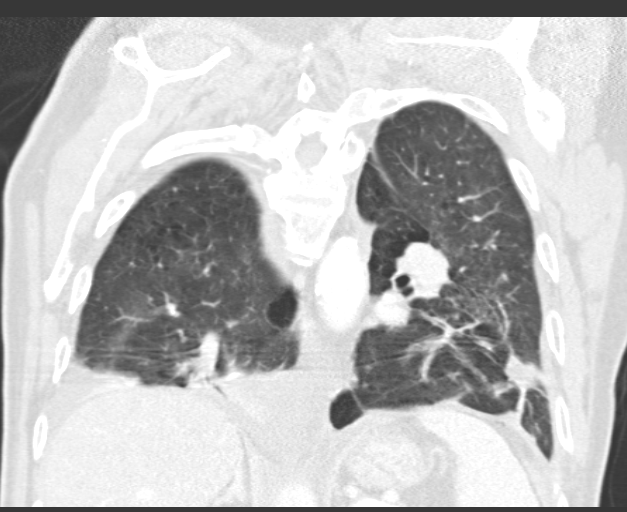

[15 of 36 positions shown; findings below may reference images not displayed]

FINDINGS: There is consolidation in the left lower lobe involving the
posterior lateral aspect with air bronchograms throughout the area
of abnormality. There is also mild infiltrate across the fissure in
the posterior inferior lingula. There is infiltrate in the posterior
aspect of the right upper lobe.

There is a small loculated left pleural effusion extending into the
fissure. On the right, there is a moderate pleural effusion. There
is dependent consolidation in the posterior right lower lobe most
likely representing atelectasis related to the effusion.

Thoracic inlet and thyroid are normal. There is moderate
calcification of the thoracic aorta. There are numerous mediastinal
lymph nodes. The largest is a precarinal lymph node measuring 11 mm.

There is coronary artery calcification. There is no significant
pericardial effusion.

In this patient that is status post abdominal surgery there is again
noted to be pneumoperitoneum as described on report of prior chest
radiograph. There is a small volume of free fluid in the upper
abdomen.
IMPRESSION: Evidence of multifocal pneumonia on the left involving the lower
lobe and lingula with small partially loculated left effusion.

Moderate right pleural effusion with consolidation most consistent
with atelectasis at the lung base. Infiltrate right upper lobe could
represent pneumonia/ pneumonitis.

Mildly enlarged mediastinal lymph nodes possibly reactive.

Pneumoperitoneum as previously described likely related to
postsurgical status.
